# Patient Record
Sex: Female | Born: 1950
Health system: Southern US, Community
[De-identification: ages and names within clinical notes are randomized; demographics above are authoritative.]

## PROBLEM LIST (undated history)

## (undated) DIAGNOSIS — D0510 Intraductal carcinoma in situ of unspecified breast: Secondary | ICD-10-CM

## (undated) DIAGNOSIS — F419 Anxiety disorder, unspecified: Secondary | ICD-10-CM

## (undated) DIAGNOSIS — E785 Hyperlipidemia, unspecified: Secondary | ICD-10-CM

## (undated) DIAGNOSIS — R7303 Prediabetes: Secondary | ICD-10-CM

## (undated) DIAGNOSIS — E559 Vitamin D deficiency, unspecified: Secondary | ICD-10-CM

## (undated) DIAGNOSIS — I10 Essential (primary) hypertension: Secondary | ICD-10-CM

## (undated) HISTORY — DX: Hyperlipidemia, unspecified: E78.5

## (undated) HISTORY — PX: LUMBAR DISC SURGERY: SHX700

## (undated) HISTORY — PX: TUBAL LIGATION: SHX77

## (undated) HISTORY — DX: Intraductal carcinoma in situ of unspecified breast: D05.10

## (undated) HISTORY — DX: Vitamin D deficiency, unspecified: E55.9

---
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
## 1998-09-04 ENCOUNTER — Encounter: Payer: Self-pay | Admitting: Specialist

## 1998-09-04 ENCOUNTER — Observation Stay (HOSPITAL_COMMUNITY): Admission: RE | Admit: 1998-09-04 | Discharge: 1998-09-05 | Payer: Self-pay | Admitting: Specialist

## 1999-04-30 ENCOUNTER — Other Ambulatory Visit: Admission: RE | Admit: 1999-04-30 | Discharge: 1999-04-30 | Payer: Self-pay | Admitting: Obstetrics and Gynecology

## 1999-08-07 ENCOUNTER — Encounter (INDEPENDENT_AMBULATORY_CARE_PROVIDER_SITE_OTHER): Payer: Self-pay | Admitting: Specialist

## 1999-08-07 ENCOUNTER — Ambulatory Visit (HOSPITAL_COMMUNITY): Admission: RE | Admit: 1999-08-07 | Discharge: 1999-08-07 | Payer: Self-pay | Admitting: Obstetrics and Gynecology

## 2000-06-02 ENCOUNTER — Other Ambulatory Visit: Admission: RE | Admit: 2000-06-02 | Discharge: 2000-06-02 | Payer: Self-pay | Admitting: Obstetrics and Gynecology

## 2000-08-18 ENCOUNTER — Ambulatory Visit (HOSPITAL_COMMUNITY): Admission: RE | Admit: 2000-08-18 | Discharge: 2000-08-18 | Payer: Self-pay | Admitting: Internal Medicine

## 2000-08-18 ENCOUNTER — Encounter: Payer: Self-pay | Admitting: Internal Medicine

## 2001-06-02 ENCOUNTER — Other Ambulatory Visit: Admission: RE | Admit: 2001-06-02 | Discharge: 2001-06-02 | Payer: Self-pay | Admitting: Obstetrics and Gynecology

## 2002-09-24 ENCOUNTER — Encounter: Payer: Self-pay | Admitting: Internal Medicine

## 2002-09-24 ENCOUNTER — Ambulatory Visit (HOSPITAL_COMMUNITY): Admission: RE | Admit: 2002-09-24 | Discharge: 2002-09-24 | Payer: Self-pay | Admitting: Internal Medicine

## 2003-02-07 ENCOUNTER — Encounter: Payer: Self-pay | Admitting: Internal Medicine

## 2003-02-07 ENCOUNTER — Ambulatory Visit (HOSPITAL_COMMUNITY): Admission: RE | Admit: 2003-02-07 | Discharge: 2003-02-07 | Payer: Self-pay | Admitting: Internal Medicine

## 2003-06-10 ENCOUNTER — Emergency Department (HOSPITAL_COMMUNITY): Admission: EM | Admit: 2003-06-10 | Discharge: 2003-06-10 | Payer: Self-pay | Admitting: Emergency Medicine

## 2004-09-02 LAB — HM COLONOSCOPY

## 2004-10-04 ENCOUNTER — Other Ambulatory Visit: Admission: RE | Admit: 2004-10-04 | Discharge: 2004-10-04 | Payer: Self-pay | Admitting: Obstetrics and Gynecology

## 2005-04-30 ENCOUNTER — Ambulatory Visit: Payer: Self-pay | Admitting: Gastroenterology

## 2005-05-13 ENCOUNTER — Ambulatory Visit (HOSPITAL_COMMUNITY): Admission: RE | Admit: 2005-05-13 | Discharge: 2005-05-13 | Payer: Self-pay | Admitting: Internal Medicine

## 2005-06-28 ENCOUNTER — Ambulatory Visit: Payer: Self-pay | Admitting: Gastroenterology

## 2005-10-07 ENCOUNTER — Other Ambulatory Visit: Admission: RE | Admit: 2005-10-07 | Discharge: 2005-10-07 | Payer: Self-pay | Admitting: Obstetrics and Gynecology

## 2008-08-01 ENCOUNTER — Ambulatory Visit (HOSPITAL_COMMUNITY): Admission: RE | Admit: 2008-08-01 | Discharge: 2008-08-01 | Payer: Self-pay | Admitting: Internal Medicine

## 2008-09-14 ENCOUNTER — Ambulatory Visit (HOSPITAL_COMMUNITY): Admission: RE | Admit: 2008-09-14 | Discharge: 2008-09-14 | Payer: Self-pay | Admitting: Internal Medicine

## 2009-03-21 ENCOUNTER — Ambulatory Visit (HOSPITAL_COMMUNITY): Admission: RE | Admit: 2009-03-21 | Discharge: 2009-03-21 | Payer: Self-pay | Admitting: Internal Medicine

## 2009-07-13 LAB — HM DEXA SCAN

## 2009-09-02 HISTORY — PX: BREAST LUMPECTOMY: SHX2

## 2010-09-23 ENCOUNTER — Encounter: Payer: Self-pay | Admitting: Internal Medicine

## 2011-01-18 NOTE — Op Note (Signed)
[facility]  Patient:    Regina Shaw                      MRN: [id number] Proc. Date: 08/07/99 Adm. Date:  [id number] Attending:  Amanda Cockayne                           Operative Report  PREOPERATIVE DIAGNOSIS:       Irregular spotting on Prempro and hysterosonogram was irregular.  POSTOPERATIVE DIAGNOSIS:      Irregular spotting on Prempro and hysterosonogram was irregular.  Plus she had some endometrial polyps, small and she had somewhat atrophic endometrium.  OPERATION:                    Hysteroscopy and D&C.  SURGEON:                      Esmeralda Arthur, M.D.  ASSISTANT:  CATHETER:                     None.  MEDIUM:                       Sorbitol, deficit of 40 cc.  ANESTHESIA:  ESTIMATED BLOOD LOSS:  DESCRIPTION OF PROCEDURE:     The patient was carried to the operating room and  after satisfactory general anesthesia she was placed in the lithotomy position nd she was prepped and draped in a sterile field and the bladder was emptied by catheterization.  Examination revealed the uterus to feel anterior and not enlarged.  No masses felt in the adnexa.  A weighted speculum was placed in the posterior vagina.  The cervix was grasped  with a tenaculum.  The uterus sounded to 8 cm.  The cervix was easily dilated to a #23 Hegar dilators and the scope was placed in.  The endometrial cavity looked atrophic, but there were some irregular areas which I think were polyps.  The right tubal opening was large and the left tubal opening was visualized.  The right was much, much, probably three times as large as the left.  I could see no fibroids and I could see the irregular areas which were either hyperplasia or she had polyps.  We then did an exploration with Randall stonegrasping forceps and obtained no tissue.  Did a curettage sharp and got some tissue particularly posteriorly and  then did a Heaney curet with  a moderate amount of tissue.  I looked with the hysteroscope again and I could see no abnormalities and the procedure was terminated.  The patient was carried to the recovery room in good  condition. DD:  08/07/99 TD:  08/08/99 Job: [id number]

## 2012-04-29 ENCOUNTER — Encounter: Payer: Self-pay | Admitting: Family Medicine

## 2012-04-29 ENCOUNTER — Ambulatory Visit (INDEPENDENT_AMBULATORY_CARE_PROVIDER_SITE_OTHER): Payer: 59 | Admitting: Family Medicine

## 2012-04-29 VITALS — BP 120/60 | HR 80 | Temp 97.9°F | Ht 62.5 in | Wt 161.0 lb

## 2012-04-29 DIAGNOSIS — R5383 Other fatigue: Secondary | ICD-10-CM

## 2012-04-29 DIAGNOSIS — E785 Hyperlipidemia, unspecified: Secondary | ICD-10-CM

## 2012-04-29 DIAGNOSIS — E559 Vitamin D deficiency, unspecified: Secondary | ICD-10-CM

## 2012-04-29 DIAGNOSIS — Z13220 Encounter for screening for lipoid disorders: Secondary | ICD-10-CM

## 2012-04-29 DIAGNOSIS — R5381 Other malaise: Secondary | ICD-10-CM

## 2012-04-29 DIAGNOSIS — D0510 Intraductal carcinoma in situ of unspecified breast: Secondary | ICD-10-CM

## 2012-04-29 DIAGNOSIS — H9209 Otalgia, unspecified ear: Secondary | ICD-10-CM

## 2012-04-29 DIAGNOSIS — D0590 Unspecified type of carcinoma in situ of unspecified breast: Secondary | ICD-10-CM

## 2012-04-29 DIAGNOSIS — Z131 Encounter for screening for diabetes mellitus: Secondary | ICD-10-CM

## 2012-04-29 DIAGNOSIS — Z86000 Personal history of in-situ neoplasm of breast: Secondary | ICD-10-CM | POA: Insufficient documentation

## 2012-04-29 HISTORY — DX: Intraductal carcinoma in situ of unspecified breast: D05.10

## 2012-04-29 LAB — CBC WITH DIFFERENTIAL/PLATELET
Basophils Absolute: 0 10*3/uL (ref 0.0–0.1)
Basophils Relative: 0.7 % (ref 0.0–3.0)
Eosinophils Absolute: 0.1 10*3/uL (ref 0.0–0.7)
Eosinophils Relative: 2.6 % (ref 0.0–5.0)
HCT: 39.9 % (ref 36.0–46.0)
Hemoglobin: 13 g/dL (ref 12.0–15.0)
Lymphocytes Relative: 40.8 % (ref 12.0–46.0)
Lymphs Abs: 2.1 10*3/uL (ref 0.7–4.0)
MCHC: 32.7 g/dL (ref 30.0–36.0)
MCV: 87.8 fl (ref 78.0–100.0)
Monocytes Absolute: 0.4 10*3/uL (ref 0.1–1.0)
Monocytes Relative: 7.3 % (ref 3.0–12.0)
Neutro Abs: 2.5 10*3/uL (ref 1.4–7.7)
Neutrophils Relative %: 48.6 % (ref 43.0–77.0)
Platelets: 164 10*3/uL (ref 150.0–400.0)
RBC: 4.54 Mil/uL (ref 3.87–5.11)
RDW: 14.4 % (ref 11.5–14.6)
WBC: 5.2 10*3/uL (ref 4.5–10.5)

## 2012-04-29 LAB — HEPATIC FUNCTION PANEL
ALT: 12 U/L (ref 0–35)
AST: 20 U/L (ref 0–37)
Albumin: 3.5 g/dL (ref 3.5–5.2)
Alkaline Phosphatase: 71 U/L (ref 39–117)
Bilirubin, Direct: 0 mg/dL (ref 0.0–0.3)
Total Bilirubin: 0.6 mg/dL (ref 0.3–1.2)
Total Protein: 6.4 g/dL (ref 6.0–8.3)

## 2012-04-29 LAB — BASIC METABOLIC PANEL
BUN: 11 mg/dL (ref 6–23)
CO2: 28 mEq/L (ref 19–32)
Calcium: 9.1 mg/dL (ref 8.4–10.5)
Chloride: 105 mEq/L (ref 96–112)
Creatinine, Ser: 0.7 mg/dL (ref 0.4–1.2)
GFR: 88.32 mL/min (ref >60.00)
Glucose, Bld: 96 mg/dL (ref 70–99)
Potassium: 3.6 mEq/L (ref 3.5–5.1)
Sodium: 139 mEq/L (ref 135–145)

## 2012-04-29 LAB — LDL CHOLESTEROL, DIRECT: Direct LDL: 139.2 mg/dL

## 2012-04-29 NOTE — Progress Notes (Signed)
Nature conservation officer at [facility] [address] [place] [address] Phone: [phone] Fax: [phone]  Date:  04/29/2012   Name:  Regina Shaw   DOB:  Mar 29, 1951   MRN:  [id number] Gender: female Age: 61 y.o.  PCP:  Hannah Beat, MD    Chief Complaint: No chief complaint on file.  History of Present Illness:  Regina Shaw is a 61 y.o. very pleasant female patient who presents with the following:  New patient.  H/o DCIS, s/p lumpectomy in 09/2009, now doing fine, mammos have been normal  R ear is bothering her. Ceiling fan will makea clicking sound, and when lying on her R side. Will hurt her a lot. Will feel kind of raw.   Vit D level? Low in the past  H/o high chol, but she is not on medication  Past Medical History, Surgical History, Social History, Family History, Problem List, Medications, and Allergies have been reviewed and updated if relevant.  Patient Active Problem List  Diagnosis  . DCIS (ductal carcinoma in situ) of breast  . Hyperlipidemia    Past Medical History  Diagnosis Date  . DCIS (ductal carcinoma in situ) of breast 04/29/2012    Past Surgical History  Procedure Date  . Tubal ligation   . Breast lumpectomy 09/2009    [facility]  . Lumbar disc surgery     [facility]  . Tonsillectomy and adenoidectomy 1960    History  Substance Use Topics  . Smoking status: Former Games developer  . Smokeless tobacco: Not on file  . Alcohol Use: Not on file    Family History  Problem Relation Age of Onset  . Liver disease Mother     d/c at 56, acute liver failure  . COPD Father     d/c at 33  . Heart attack Brother 48  . Stroke Brother 15    same brother  . Alcohol abuse Brother     other brother    No Known Allergies  No current outpatient prescriptions on file prior to visit.     Review of Systems:  GEN: No acute illnesses, no fevers, chills. GI: No n/v/d, eating normally Pulm: No SOB Interactive and getting along  well at home.  Otherwise, ROS is as per the HPI.   Physical Examination: Filed Vitals:   04/29/12 1401  BP: 120/60  Pulse: 80  Temp: 97.9 F (36.6 C)   Filed Vitals:   04/29/12 1401  Height: 5' 2.5" (1.588 m)  Weight: 161 lb (73.029 kg)   Body mass index is 28.98 kg/(m^2). Ideal Body Weight: Weight in (lb) to have BMI = 25: 138.6    GEN: WDWN, NAD, Non-toxic, A & O x 3 HEENT: Atraumatic, Normocephalic. Neck supple. No masses, No LAD. Ears and Nose: No external deformity. Cerumen impaction CV: RRR, No M/G/R. No JVD. No thrill. No extra heart sounds. PULM: CTA B, no wheezes, crackles, rhonchi. No retractions. No resp. distress. No accessory muscle use. EXTR: No c/c/e NEURO Normal gait.  PSYCH: Normally interactive. Conversant. Not depressed or anxious appearing.  Calm demeanor.    Assessment and Plan:  1. Ear pain    2. Screening for diabetes mellitus  Basic metabolic panel  3. Screening for lipoid disorders  LDL cholesterol, direct  4. Other malaise and fatigue  CBC with Differential, Hepatic function panel  5. Vitamin d deficiency  Vitamin D 25 hydroxy  6. DCIS (ductal carcinoma in situ) of breast  7. Hyperlipidemia     Ceruminosis is noted.  Wax is removed by syringing and manual debridement by nursing staff  Check basic labs  We will obtain records from the patient's prior physicians.   Orders Today:  Orders Placed This Encounter  Procedures  . Vitamin D 25 hydroxy  . Basic metabolic panel  . CBC with Differential  . Hepatic function panel  . LDL cholesterol, direct    Medications Today: (Includes new updates added during medication reconciliation) Meds ordered this encounter  Medications  . Cholecalciferol (VITAMIN D) 2000 UNITS CAPS    Sig: Take one by mouth daily  . Omega-3 Fatty Acids (FISH OIL PO)    Sig: Take by mouth.  Marland Kitchen aspirin 81 MG tablet    Sig: Take 81 mg by mouth daily.  . Cyanocobalamin (VITAMIN B12 PO)    Sig: Take by mouth.  .  Multiple Vitamin (MULTIVITAMIN) tablet    Sig: Take 1 tablet by mouth daily.    Medications Discontinued: There are no discontinued medications.   Hannah Beat, MD

## 2012-04-30 ENCOUNTER — Encounter: Payer: Self-pay | Admitting: *Deleted

## 2012-04-30 LAB — VITAMIN D 25 HYDROXY (VIT D DEFICIENCY, FRACTURES): Vit D, 25-Hydroxy: 54 ng/mL (ref 30–89)

## 2012-05-18 ENCOUNTER — Telehealth: Payer: Self-pay

## 2012-05-18 NOTE — Telephone Encounter (Signed)
Pt left v/m with name DOB and phone #. I called phone # but v/m not set up.

## 2012-05-21 NOTE — Telephone Encounter (Signed)
Left vm for pt to callback 

## 2012-05-29 NOTE — Telephone Encounter (Signed)
Left vm for pt to callback 

## 2012-06-04 NOTE — Telephone Encounter (Signed)
Letter sent to pt

## 2012-06-24 ENCOUNTER — Ambulatory Visit: Payer: 59 | Admitting: Family Medicine

## 2012-06-29 ENCOUNTER — Ambulatory Visit: Payer: 59 | Admitting: Family Medicine

## 2012-07-02 ENCOUNTER — Ambulatory Visit: Payer: 59 | Admitting: Family Medicine

## 2012-07-02 DIAGNOSIS — Z0289 Encounter for other administrative examinations: Secondary | ICD-10-CM

## 2013-09-06 DIAGNOSIS — E782 Mixed hyperlipidemia: Secondary | ICD-10-CM | POA: Insufficient documentation

## 2013-09-06 DIAGNOSIS — E785 Hyperlipidemia, unspecified: Secondary | ICD-10-CM

## 2013-09-07 ENCOUNTER — Encounter: Payer: Self-pay | Admitting: Internal Medicine

## 2013-09-07 DIAGNOSIS — I10 Essential (primary) hypertension: Secondary | ICD-10-CM | POA: Insufficient documentation

## 2013-09-07 DIAGNOSIS — R7303 Prediabetes: Secondary | ICD-10-CM

## 2013-09-07 DIAGNOSIS — E559 Vitamin D deficiency, unspecified: Secondary | ICD-10-CM | POA: Insufficient documentation

## 2013-09-07 HISTORY — DX: Prediabetes: R73.03

## 2013-09-07 NOTE — Progress Notes (Signed)
Patient ID: Regina Shaw, female   DOB: May 15, 1951, 63 y.o.   MRN: [id number]  Annual Screening Comprehensive Examination  This very nice 63 y.o. female presents for complete physical.  Patient has been followed for HTN, Diabetes  Prediabetes, Hyperlipidemia, and Vitamin D Deficiency.    HTN predates since     . Patient's BP has been controlled at home. Today's  . Patient denies any cardiac symptoms as chest pain, palpitations, shortness of breath, dizziness or ankle swelling.   Patient's hyperlipidemia is controlled with diet and medications. Patient denies myalgias or other medication SE's. Last cholesterol last visit was  , triglycerides  , HDL  and LDL   .     Patient has prediabetes/insulin resistance predating since      with last A1c    / insulin   . Patient denies reactive hypoglycemic s   Finally, patient has history of Vitamin D Deficiency with last vitamin D      Current Outpatient Prescriptions on File Prior to Visit  Medication Sig Dispense Refill  . aspirin 81 MG tablet Take 81 mg by mouth daily.      . Cholecalciferol (VITAMIN D) 2000 UNITS CAPS Take one by mouth daily      . Cyanocobalamin (VITAMIN B12 PO) Take by mouth.      . Multiple Vitamin (MULTIVITAMIN) tablet Take 1 tablet by mouth daily.      . Omega-3 Fatty Acids (FISH OIL PO) Take by mouth.       No current facility-administered medications on file prior to visit.    Allergies  Allergen Reactions  . Darvon [Propoxyphene]   . Red Yeast Rice [Cholestin]     Dizzy    Past Medical History  Diagnosis Date  . DCIS (ductal carcinoma in situ) of breast 04/29/2012  . Hyperlipidemia   . Vitamin D deficiency     Past Surgical History  Procedure Laterality Date  . Tubal ligation    . Breast lumpectomy  09/2009    [facility]  . Lumbar disc surgery      [facility]  . Tonsillectomy and adenoidectomy  1960    Family History  Problem Relation Age of Onset  . Liver disease Mother     d/c at 87,  acute liver failure  . COPD Father     d/c at 32  . Heart attack Brother 80  . Stroke Brother 34    same brother  . Alcohol abuse Brother     other brother    History  Substance Use Topics  . Smoking status: Former Research scientist (life sciences)  . Smokeless tobacco: Not on file  . Alcohol Use: Not on file    ROS Constitutional: Denies fever, chills, weight loss/gain, headaches, insomnia, fatigue, night sweats, and change in appetite. Eyes: Denies redness, blurred vision, diplopia, discharge, itchy, watery eyes.  ENT: Denies discharge, congestion, post nasal drip, epistaxis, sore throat, earache, hearing loss, dental pain, Tinnitus, Vertigo, Sinus pain, snoring.  Cardio: Denies chest pain, palpitations, irregular heartbeat, syncope, dyspnea, diaphoresis, orthopnea, PND, claudication, edema Respiratory: denies cough, dyspnea, DOE, pleurisy, hoarseness, laryngitis, wheezing.  Gastrointestinal: Denies dysphagia, heartburn, reflux, water brash, pain, cramps, nausea, vomiting, bloating, diarrhea, constipation, hematemesis, melena, hematochezia, jaundice, hemorrhoids Genitourinary: Denies dysuria, frequency, urgency, nocturia, hesitancy, discharge, hematuria, flank pain Breast:Breast lumps, nipple discharge, bleeding.  Musculoskeletal: Denies arthralgia, myalgia, stiffness, Jt. Swelling, pain, limp, and strain/sprain. Skin: Denies puritis, rash, hives, warts, acne, eczema, changing in skin lesion Neuro: No weakness, tremor, incoordination, spasms,  paresthesia, pain Psychiatric: Denies confusion, memory loss, sensory loss Endocrine: Denies change in weight, skin, hair change, nocturia, and paresthesia, diabetic polys, visual blurring, hyper / hypo glycemic episodes.  Heme/Lymph: No excessive bleeding, bruising, enlarged lymph nodes.  There were no vitals filed for this visit.  Estimated body mass index is 28.96 kg/(m^2) as calculated from the following:   Height as of 04/29/12: 5' 2.5" (1.588 m).   Weight as of  04/29/12: 161 lb (73.029 kg).  Physical Exam General Appearance: Well nourished, in no apparent distress. Eyes: PERRLA, EOMs, conjunctiva no swelling or erythema, normal fundi and vessels. Sinuses: No frontal/maxillary tenderness ENT/Mouth: EACs patent / TMs  nl. Nares clear without erythema, swelling, mucoid exudates. Oral hygiene is good. No erythema, swelling, or exudate. Tongue normal, non-obstructing. Tonsils not swollen or erythematous. Hearing normal.  Neck: Supple, thyroid normal. No bruits, nodes or JVD. Respiratory: Respiratory effort normal.  BS equal and clear bilateral without rales, rhonci, wheezing or stridor. Cardio: Heart sounds are normal with regular rate and rhythm and no murmurs, rubs or gallops. Peripheral pulses are normal and equal bilaterally without edema. No aortic or femoral bruits. Chest: symmetric with normal excursions and percussion. Breasts: Symmetric, without lumps, nipple discharge, retractions, or fibrocystic changes.  Abdomen: Flat, soft, with bowl sounds. Nontender, no guarding, rebound, hernias, masses, or organomegaly.  Lymphatics: Non tender without lymphadenopathy.  Genitourinary:  Musculoskeletal: Full ROM all peripheral extremities, joint stability, 5/5 strength, and normal gait. Skin: Warm and dry without rashes, lesions, cyanosis, clubbing or  ecchymosis.  Neuro: Cranial nerves intact, reflexes equal bilaterally. Normal muscle tone, no cerebellar symptoms. Sensation intact.  Pysch: Awake and oriented X 3, normal affect, Insight and Judgment appropriate.   Assessment and Plan  1. Annual Screening Examination 2. Hypertension  3. Hyperlipidemia 4. Pre Diabetes 5. Vitamin D Deficiency  Continue prudent diet as discussed, weight control, BP monitoring, regular exercise, and medications. Discussed med's effects and SE's. Screening labs and tests as requested with regular follow-up as recommended.   This encounter was created in error - please  disregard.

## 2013-09-07 NOTE — Patient Instructions (Signed)

## 2013-09-22 ENCOUNTER — Ambulatory Visit (INDEPENDENT_AMBULATORY_CARE_PROVIDER_SITE_OTHER): Payer: 59 | Admitting: Physician Assistant

## 2013-09-22 VITALS — BP 122/60 | HR 80 | Temp 97.9°F | Resp 16 | Wt 163.0 lb

## 2013-09-22 DIAGNOSIS — M26609 Unspecified temporomandibular joint disorder, unspecified side: Secondary | ICD-10-CM

## 2013-09-22 DIAGNOSIS — F411 Generalized anxiety disorder: Secondary | ICD-10-CM

## 2013-09-22 MED ORDER — CYCLOBENZAPRINE HCL 10 MG PO TABS: ORAL_TABLET | ORAL | Status: DC

## 2013-09-22 MED ORDER — SERTRALINE HCL 50 MG PO TABS: 50.0000 mg | ORAL_TABLET | Freq: Every day | ORAL | Status: DC

## 2013-09-22 NOTE — Progress Notes (Signed)
   Subjective:    Patient ID: Regina Shaw, female    DOB: 1951-04-12, 63 y.o.   MRN: [id number]  Otalgia  There is pain in both ears. This is a new problem. The current episode started today. The problem occurs constantly. The problem has been unchanged. There has been no fever. The pain is mild. Associated symptoms include hearing loss (feels she is talking in a tunnel). Pertinent negatives include no abdominal pain, coughing, diarrhea, drainage, ear discharge, headaches, neck pain, rash, rhinorrhea, sore throat or vomiting. Treatments tried: tried autoinflation. The treatment provided no relief.   Review of Systems  Constitutional: Negative.   HENT: Positive for dental problem (Recent dental work ), ear pain and hearing loss (feels she is talking in a tunnel). Negative for ear discharge, rhinorrhea and sore throat.   Eyes: Negative.   Respiratory: Negative.  Negative for cough.   Cardiovascular: Negative.   Gastrointestinal: Negative.  Negative for vomiting, abdominal pain and diarrhea.  Genitourinary: Negative.   Musculoskeletal: Negative.  Negative for neck pain.  Skin: Negative for rash.  Neurological: Negative.  Negative for headaches.      Objective:   Physical Exam  Constitutional: She is oriented to person, place, and time. She appears well-developed and well-nourished.  HENT:  Head: Normocephalic and atraumatic.  Right Ear: External ear normal.  Left Ear: External ear normal.  Mouth/Throat: Oropharynx is clear and moist.  + Right TMJ tenderness Some right upper tooth pain  Eyes: Conjunctivae and EOM are normal. Pupils are equal, round, and reactive to light.  Neck: Normal range of motion. Neck supple. No thyromegaly present.  Cardiovascular: Normal rate, regular rhythm and normal heart sounds.  Exam reveals no gallop and no friction rub.   No murmur heard. Pulmonary/Chest: Effort normal and breath sounds normal. No respiratory distress. She has no wheezes.  Abdominal:  Soft. Bowel sounds are normal. She exhibits no distension and no mass. There is no tenderness. There is no rebound and no guarding.  Musculoskeletal: Normal range of motion.  Lymphadenopathy:    She has no cervical adenopathy.  Neurological: She is alert and oriented to person, place, and time. She displays normal reflexes. No cranial nerve deficit. Coordination normal.  Skin: Skin is warm and dry.  Psychiatric: She has a normal mood and affect.      Assessment & Plan:  TMJ vs Otitis effusion without infection  Likely TMJ with recent dental work. Worse in morning.   Massage, heat, soft foods.   Muscle relaxer  Get on antihistamine, continue autoinflation  Anxiety/stress- zoloft 50mg  daily.

## 2013-09-22 NOTE — Patient Instructions (Signed)
What is the TMJ? The temporomandibular (tem-PUH-ro-man-DIB-yoo-ler) joint, or the TMJ, connects the upper and lower jawbones. This joint allows the jaw to open wide and move back and forth when you chew, talk, or yawn.There are also several muscles that help this joint move. There can be muscle tightness and pain in the muscle that can cause several symptoms.  What causes TMJ pain? There are many causes of TMJ pain. Repeated chewing (for example, chewing gum) and clenching your teeth can cause pain in the joint. Some TMJ pain has no obvious cause. What can I do to ease the pain? There are many things you can do to help your pain get better. When you have pain:  Eat soft foods and stay away from chewy foods (for example, taffy) Try to use both sides of your mouth to chew Don't chew gum Don't open your mouth wide (for example, during yawning or singing) Don't bite your cheeks or fingernails Lower your amount of stress and worry Applying a warm, damp washcloth to the joint may help. Massage/ please call your insurance company and see how much PT is for your neck /jaw Over-the-counter pain medicines such as ibuprofen (one brand: Advil) or acetaminophen (one brand: Tylenol) might also help. Do not use these medicines if you are allergic to them or if your doctor told you not to use them. How can I stop the pain from coming back? When your pain is better, you can do these exercises to make your muscles stronger and to keep the pain from coming back:  Resisted mouth opening: Place your thumb or two fingers under your chin and open your mouth slowly, pushing up lightly on your chin with your thumb. Hold for three to six seconds. Close your mouth slowly. Resisted mouth closing: Place your thumbs under your chin and your two index fingers on the ridge between your mouth and the bottom of your chin. Push down lightly on your chin as you close your mouth. Tongue up: Slowly open and close your mouth while  keeping the tongue touching the roof of the mouth. Side-to-side jaw movement: Place an object about one fourth of an inch thick (for example, two tongue depressors) between your front teeth. Slowly move your jaw from side to side. Increase the thickness of the object as the exercise becomes easier Forward jaw movement: Place an object about one fourth of an inch thick between your front teeth and move the bottom jaw forward so that the bottom teeth are in front of the top teeth. Increase the thickness of the object as the exercise becomes easier. These exercises should not be painful. If it hurts to do these exercises, stop doing them and talk to your family doctor.

## 2013-09-24 ENCOUNTER — Other Ambulatory Visit: Payer: Self-pay | Admitting: Physician Assistant

## 2013-09-24 DIAGNOSIS — M26609 Unspecified temporomandibular joint disorder, unspecified side: Secondary | ICD-10-CM

## 2013-09-24 DIAGNOSIS — M542 Cervicalgia: Secondary | ICD-10-CM

## 2013-10-17 DIAGNOSIS — Z79899 Other long term (current) drug therapy: Secondary | ICD-10-CM | POA: Insufficient documentation

## 2013-10-17 NOTE — Progress Notes (Signed)
Patient ID: Regina Shaw, female   DOB: 1951/07/03, 63 y.o.   MRN: [id number]   Annual Screening Comprehensive Examination  This very nice 63 y.o. [facility] presents for complete physical.  Patient has been followed for Labile HTN, Prediabetes, Hyperlipidemia, and Vitamin D Deficiency.    Labile HTN has been monitored expectantly  Since 2004. Patient's BP has been controlled at home. Today's BP: 132/66 mmHg. Patient denies any cardiac symptoms as chest pain, palpitations, shortness of breath, dizziness or ankle swelling.   Patient's hyperlipidemia is controlled with diet. Patient denies myalgias or other medication SE's. Last cholesterol last visit was 223, triglycerides 164, HDL 62 and LDL 128 in July 2014.    Patient has prediabetes with A1c 6.3% in Mar 2011 and last A1c was 5.8% in July 2014. Patient denies reactive hypoglycemic symptoms, visual blurring, diabetic polys, or paresthesias.    Finally, patient has history of Vitamin D Deficiency of 24 in 2008 with last vitamin D 66 in July 2014.       Medication List     aspirin 81 MG tablet  Take 81 mg by mouth daily.     cyclobenzaprine 10 MG tablet  Commonly known as:  FLEXERIL  1-2 pill at night for jaw/neck pain.     multivitamin tablet  Take 1 tablet by mouth daily.     sertraline 50 MG tablet  Commonly known as:  ZOLOFT  Take 1 tablet (50 mg total) by mouth daily.     Vitamin D 2000 UNITS Caps  Take one by mouth daily        Allergies  Allergen Reactions  . Darvon [Propoxyphene]   . Red Yeast Rice [Cholestin]     Dizzy    Past Medical History  Diagnosis Date  . DCIS (ductal carcinoma in situ) of breast 04/29/2012  . Hyperlipidemia   . Vitamin D deficiency     Past Surgical History  Procedure Laterality Date  . Tubal ligation    . Breast lumpectomy  09/2009    [facility]  . Lumbar disc surgery      [facility]  . Tonsillectomy and adenoidectomy  1960    Family History  Problem Relation Age of  Onset  . Liver disease Mother     d/c at 59, acute liver failure  . COPD Father     d/c at 101  . Heart attack Brother 15  . Stroke Brother 96    same brother  . Alcohol abuse Brother     other brother    History  Substance Use Topics  . Smoking status: Former Research scientist (life sciences)  . Smokeless tobacco: Not on file  . Alcohol Use: No    ROS Constitutional: Denies fever, chills, weight loss/gain, headaches, insomnia, fatigue, night sweats, and change in appetite. Eyes: Denies redness, blurred vision, diplopia, discharge, itchy, watery eyes.  ENT: Denies discharge, congestion, post nasal drip, epistaxis, sore throat, earache, hearing loss, dental pain, Tinnitus, Vertigo, Sinus pain, snoring.  Cardio: Denies chest pain, palpitations, irregular heartbeat, syncope, dyspnea, diaphoresis, orthopnea, PND, claudication, edema Respiratory: denies cough, dyspnea, DOE, pleurisy, hoarseness, laryngitis, wheezing.  Gastrointestinal: Denies dysphagia, heartburn, reflux, water brash, pain, cramps, nausea, vomiting, bloating, diarrhea, constipation, hematemesis, melena, hematochezia, jaundice, hemorrhoids Genitourinary: Denies dysuria, frequency, urgency, nocturia, hesitancy, discharge, hematuria, flank pain Breast:Breast lumps, nipple discharge, bleeding.  Musculoskeletal: Denies arthralgia, myalgia, stiffness, Jt. Swelling, pain, limp, and strain/sprain. Skin: Denies puritis, rash, hives, warts, acne, eczema, changing in skin lesion Neuro: No weakness, tremor, incoordination,  spasms, paresthesia, pain Psychiatric: Denies confusion, memory loss, sensory loss Endocrine: Denies change in weight, skin, hair change, nocturia, and paresthesia, diabetic polys, visual blurring, hyper / hypo glycemic episodes.  Heme/Lymph: No excessive bleeding, bruising, enlarged lymph nodes.  BP: 132/66  Pulse: 92  Temp: 98.1 F (36.7 C)  Resp: 18    Estimated body mass index is 29.43 kg/(m^2) as calculated from the following:    Height as of this encounter: 5' 2.25" (1.581 m).   Weight as of this encounter: 162 lb 3.2 oz (73.573 kg).  Physical Exam General Appearance: Well nourished, in no apparent distress. Eyes: PERRLA, EOMs, conjunctiva no swelling or erythema, normal fundi and vessels. Sinuses: No frontal/maxillary tenderness ENT/Mouth: EACs patent / TMs  nl. Nares clear without erythema, swelling, mucoid exudates. Oral hygiene is good. No erythema, swelling, or exudate. Tongue normal, non-obstructing. Tonsils not swollen or erythematous. Hearing normal.  Neck: Supple, thyroid normal. No bruits, nodes or JVD. Respiratory: Respiratory effort normal.  BS equal and clear bilateral without rales, rhonci, wheezing or stridor. Cardio: Heart sounds are normal with regular rate and rhythm and no murmurs, rubs or gallops. Peripheral pulses are normal and equal bilaterally without edema. No aortic or femoral bruits. Chest: symmetric with normal excursions and percussion. Breasts: Symmetric, without lumps, nipple discharge, retractions, or fibrocystic changes.  Abdomen: Flat, soft, with bowl sounds. Nontender, no guarding, rebound, hernias, masses, or organomegaly.  Lymphatics: Non tender without lymphadenopathy.  Genitourinary:  Musculoskeletal: Full ROM all peripheral extremities, joint stability, 5/5 strength, and normal gait. Skin: Warm and dry without rashes, lesions, cyanosis, clubbing or  ecchymosis.  Neuro: Cranial nerves intact, reflexes equal bilaterally. Normal muscle tone, no cerebellar symptoms. Sensation intact.  Pysch: Awake and oriented X 3, normal affect, Insight and Judgment appropriate.   Assessment and Plan  1. Annual Screening Examination 2. Hypertension, Labile - monitoring  3. Hyperlipidemia, diet - Treat pending labs 4. Pre Diabetes - encouraged Weight Loss 5. Vitamin D Deficiency - supplement  Continue prudent diet as discussed, weight control, BP monitoring, regular exercise, and medications.  Discussed med's effects and SE's. Screening labs and tests as requested with regular follow-up as recommended.

## 2013-10-17 NOTE — Patient Instructions (Addendum)
 Hypertension As your heart beats, it forces blood through your arteries. This force is your blood pressure. If the pressure is too high, it is called hypertension (HTN) or high blood pressure. HTN is dangerous because you may have it and not know it. High blood pressure may mean that your heart has to work harder to pump blood. Your arteries may be narrow or stiff. The extra work puts you at risk for heart disease, stroke, and other problems.  Blood pressure consists of two numbers, a higher number over a lower, 110/72, for example. It is stated as "110 over 72." The ideal is below 120 for the top number (systolic) and under 80 for the bottom (diastolic). Write down your blood pressure today. You should pay close attention to your blood pressure if you have certain conditions such as:  Heart failure.  Prior heart attack.  Diabetes  Chronic kidney disease.  Prior stroke.  Multiple risk factors for heart disease. To see if you have HTN, your blood pressure should be measured while you are seated with your arm held at the level of the heart. It should be measured at least twice. A one-time elevated blood pressure reading (especially in the Emergency Department) does not mean that you need treatment. There may be conditions in which the blood pressure is different between your right and left arms. It is important to see your caregiver soon for a recheck. Most people have essential hypertension which means that there is not a specific cause. This type of high blood pressure may be lowered by changing lifestyle factors such as:  Stress.  Smoking.  Lack of exercise.  Excessive weight.  Drug/tobacco/alcohol use.  Eating less salt. Most people do not have symptoms from high blood pressure until it has caused damage to the body. Effective treatment can often prevent, delay or reduce that damage. TREATMENT  When a cause has been identified, treatment for high blood pressure is directed at  the cause. There are a large number of medications to treat HTN. These fall into several categories, and your caregiver will help you select the medicines that are best for you. Medications may have side effects. You should review side effects with your caregiver. If your blood pressure stays high after you have made lifestyle changes or started on medicines,   Your medication(s) may need to be changed.  Other problems may need to be addressed.  Be certain you understand your prescriptions, and know how and when to take your medicine.  Be sure to follow up with your caregiver within the time frame advised (usually within two weeks) to have your blood pressure rechecked and to review your medications.  If you are taking more than one medicine to lower your blood pressure, make sure you know how and at what times they should be taken. Taking two medicines at the same time can result in blood pressure that is too low. SEEK IMMEDIATE MEDICAL CARE IF:  You develop a severe headache, blurred or changing vision, or confusion.  You have unusual weakness or numbness, or a faint feeling.  You have severe chest or abdominal pain, vomiting, or breathing problems. MAKE SURE YOU:   Understand these instructions.  Will watch your condition.  Will get help right away if you are not doing well or get worse.   Diabetes and Exercise Exercising regularly is important. It is not just about losing weight. It has many health benefits, such as:  Improving your overall fitness, flexibility, and   endurance.  Increasing your bone density.  Helping with weight control.  Decreasing your body fat.  Increasing your muscle strength.  Reducing stress and tension.  Improving your overall health. People with diabetes who exercise gain additional benefits because exercise:  Reduces appetite.  Improves the body's use of blood sugar (glucose).  Helps lower or control blood glucose.  Decreases blood  pressure.  Helps control blood lipids (such as cholesterol and triglycerides).  Improves the body's use of the hormone insulin by:  Increasing the body's insulin sensitivity.  Reducing the body's insulin needs.  Decreases the risk for heart disease because exercising:  Lowers cholesterol and triglycerides levels.  Increases the levels of good cholesterol (such as high-density lipoproteins [HDL]) in the body.  Lowers blood glucose levels. YOUR ACTIVITY PLAN  Choose an activity that you enjoy and set realistic goals. Your health care provider or diabetes educator can help you make an activity plan that works for you. You can break activities into 2 or 3 sessions throughout the day. Doing so is as good as one long session. Exercise ideas include:  Taking the dog for a walk.  Taking the stairs instead of the elevator.  Dancing to your favorite song.  Doing your favorite exercise with a friend. RECOMMENDATIONS FOR EXERCISING WITH TYPE 1 OR TYPE 2 DIABETES   Check your blood glucose before exercising. If blood glucose levels are greater than 240 mg/dL, check for urine ketones. Do not exercise if ketones are present.  Avoid injecting insulin into areas of the body that are going to be exercised. For example, avoid injecting insulin into:  The arms when playing tennis.  The legs when jogging.  Keep a record of:  Food intake before and after you exercise.  Expected peak times of insulin action.  Blood glucose levels before and after you exercise.  The type and amount of exercise you have done.  Review your records with your health care provider. Your health care provider will help you to develop guidelines for adjusting food intake and insulin amounts before and after exercising.  If you take insulin or oral hypoglycemic agents, watch for signs and symptoms of hypoglycemia. They include:  Dizziness.  Shaking.  Sweating.  Chills.  Confusion.  Drink plenty of water  while you exercise to prevent dehydration or heat stroke. Body water is lost during exercise and must be replaced.  Talk to your health care provider before starting an exercise program to make sure it is safe for you. Remember, almost any type of activity is better than none.    Cholesterol Cholesterol is a white, waxy, fat-like protein needed by your body in small amounts. The liver makes all the cholesterol you need. It is carried from the liver by the blood through the blood vessels. Deposits (plaque) may build up on blood vessel walls. This makes the arteries narrower and stiffer. Plaque increases the risk for heart attack and stroke. You cannot feel your cholesterol level even if it is very high. The only way to know is by a blood test to check your lipid (fats) levels. Once you know your cholesterol levels, you should keep a record of the test results. Work with your caregiver to to keep your levels in the desired range. WHAT THE RESULTS MEAN:  Total cholesterol is a rough measure of all the cholesterol in your blood.  LDL is the so-called bad cholesterol. This is the type that deposits cholesterol in the walls of the arteries. You want this   level to be low.  HDL is the good cholesterol because it cleans the arteries and carries the LDL away. You want this level to be high.  Triglycerides are fat that the body can either burn for energy or store. High levels are closely linked to heart disease. DESIRED LEVELS:  Total cholesterol below 200.  LDL below 100 for people at risk, below 70 for very high risk.  HDL above 50 is good, above 60 is best.  Triglycerides below 150. HOW TO LOWER YOUR CHOLESTEROL:  Diet.  Choose fish or white meat chicken and turkey, roasted or baked. Limit fatty cuts of red meat, fried foods, and processed meats, such as sausage and lunch meat.  Eat lots of fresh fruits and vegetables. Choose whole grains, beans, pasta, potatoes and cereals.  Use only  small amounts of olive, corn or canola oils. Avoid butter, mayonnaise, shortening or palm kernel oils. Avoid foods with trans-fats.  Use skim/nonfat milk and low-fat/nonfat yogurt and cheeses. Avoid whole milk, cream, ice cream, egg yolks and cheeses. Healthy desserts include angel food cake, ginger snaps, animal crackers, hard candy, popsicles, and low-fat/nonfat frozen yogurt. Avoid pastries, cakes, pies and cookies.  Exercise.  A regular program helps decrease LDL and raises HDL.  Helps with weight control.  Do things that increase your activity level like gardening, walking, or taking the stairs.  Medication.  May be prescribed by your caregiver to help lowering cholesterol and the risk for heart disease.  You may need medicine even if your levels are normal if you have several risk factors. HOME CARE INSTRUCTIONS   Follow your diet and exercise programs as suggested by your caregiver.  Take medications as directed.  Have blood work done when your caregiver feels it is necessary. MAKE SURE YOU:   Understand these instructions.  Will watch your condition.  Will get help right away if you are not doing well or get worse.      Vitamin D Deficiency Vitamin D is an important vitamin that your body needs. Having too little of it in your body is called a deficiency. A very bad deficiency can make your bones soft and can cause a condition called rickets.  Vitamin D is important to your body for different reasons, such as:   It helps your body absorb 2 minerals called calcium and phosphorus.  It helps make your bones healthy.  It may prevent some diseases, such as diabetes and multiple sclerosis.  It helps your muscles and heart. You can get vitamin D in several ways. It is a natural part of some foods. The vitamin is also added to some dairy products and cereals. Some people take vitamin D supplements. Also, your body makes vitamin D when you are in the sun. It changes the  sun's rays into a form of the vitamin that your body can use. CAUSES   Not eating enough foods that contain vitamin D.  Not getting enough sunlight.  Having certain digestive system diseases that make it hard to absorb vitamin D. These diseases include Crohn's disease, chronic pancreatitis, and cystic fibrosis.  Having a surgery in which part of the stomach or small intestine is removed.  Being obese. Fat cells pull vitamin D out of your blood. That means that obese people may not have enough vitamin D left in their blood and in other body tissues.  Having chronic kidney or liver disease. RISK FACTORS Risk factors are things that make you more likely to develop a vitamin   D deficiency. They include:  Being older.  Not being able to get outside very much.  Living in a nursing home.  Having had broken bones.  Having weak or thin bones (osteoporosis).  Having a disease or condition that changes how your body absorbs vitamin D.  Having dark skin.  Some medicines such as seizure medicines or steroids.  Being overweight or obese. SYMPTOMS Mild cases of vitamin D deficiency may not have any symptoms. If you have a very bad case, symptoms may include:  Bone pain.  Muscle pain.  Falling often.  Broken bones caused by a minor injury, due to osteoporosis. DIAGNOSIS A blood test is the best way to tell if you have a vitamin D deficiency. TREATMENT Vitamin D deficiency can be treated in different ways. Treatment for vitamin D deficiency depends on what is causing it. Options include:  Taking vitamin D supplements.  Taking a calcium supplement. Your caregiver will suggest what dose is best for you. HOME CARE INSTRUCTIONS  Take any supplements that your caregiver prescribes. Follow the directions carefully. Take only the suggested amount.  Have your blood tested 2 months after you start taking supplements.  Eat foods that contain vitamin D. Healthy choices  include:  Fortified dairy products, cereals, or juices. Fortified means vitamin D has been added to the food. Check the label on the package to be sure.  Fatty fish like salmon or trout.  Eggs.  Oysters.  Do not use a tanning bed.  Keep your weight at a healthy level. Lose weight if you need to.  Keep all follow-up appointments. Your caregiver will need to perform blood tests to make sure your vitamin D deficiency is going away. SEEK MEDICAL CARE IF:  You have any questions about your treatment.  You continue to have symptoms of vitamin D deficiency.  You have nausea or vomiting.  You are constipated.  You feel confused.  You have severe abdominal or back pain. MAKE SURE YOU:  Understand these instructions.  Will watch your condition.  Will get help right away if you are not doing well or get worse.  What is the TMJ? The temporomandibular (tem-PUH-ro-man-DIB-yoo-ler) joint, or the TMJ, connects the upper and lower jawbones. This joint allows the jaw to open wide and move back and forth when you chew, talk, or yawn.There are also several muscles that help this joint move. There can be muscle tightness and pain in the muscle that can cause several symptoms.  What causes TMJ pain? There are many causes of TMJ pain. Repeated chewing (for example, chewing gum) and clenching your teeth can cause pain in the joint. Some TMJ pain has no obvious cause. What can I do to ease the pain? There are many things you can do to help your pain get better. When you have pain:  Eat soft foods and stay away from chewy foods (for example, taffy) Try to use both sides of your mouth to chew Don't chew gum Don't open your mouth wide (for example, during yawning or singing) Don't bite your cheeks or fingernails Lower your amount of stress and worry Applying a warm, damp washcloth to the joint may help. Over-the-counter pain medicines such as ibuprofen (one brand: Advil) or acetaminophen (one  brand: Tylenol) might also help. Do not use these medicines if you are allergic to them or if your doctor told you not to use them. How can I stop the pain from coming back? When your pain is better, you can do  these exercises to make your muscles stronger and to keep the pain from coming back:  Resisted mouth opening: Place your thumb or two fingers under your chin and open your mouth slowly, pushing up lightly on your chin with your thumb. Hold for three to six seconds. Close your mouth slowly. Resisted mouth closing: Place your thumbs under your chin and your two index fingers on the ridge between your mouth and the bottom of your chin. Push down lightly on your chin as you close your mouth. Tongue up: Slowly open and close your mouth while keeping the tongue touching the roof of the mouth. Side-to-side jaw movement: Place an object about one fourth of an inch thick (for example, two tongue depressors) between your front teeth. Slowly move your jaw from side to side. Increase the thickness of the object as the exercise becomes easier Forward jaw movement: Place an object about one fourth of an inch thick between your front teeth and move the bottom jaw forward so that the bottom teeth are in front of the top teeth. Increase the thickness of the object as the exercise becomes easier. These exercises should not be painful. If it hurts to do these exercises, stop doing them and talk to your family doctor.

## 2013-10-18 ENCOUNTER — Encounter: Payer: Self-pay | Admitting: Internal Medicine

## 2013-10-18 ENCOUNTER — Ambulatory Visit (INDEPENDENT_AMBULATORY_CARE_PROVIDER_SITE_OTHER): Payer: 59 | Admitting: Internal Medicine

## 2013-10-18 VITALS — BP 132/66 | HR 92 | Temp 98.1°F | Resp 18 | Ht 62.25 in | Wt 162.2 lb

## 2013-10-18 DIAGNOSIS — Z113 Encounter for screening for infections with a predominantly sexual mode of transmission: Secondary | ICD-10-CM

## 2013-10-18 DIAGNOSIS — E559 Vitamin D deficiency, unspecified: Secondary | ICD-10-CM

## 2013-10-18 DIAGNOSIS — I10 Essential (primary) hypertension: Secondary | ICD-10-CM

## 2013-10-18 DIAGNOSIS — Z1212 Encounter for screening for malignant neoplasm of rectum: Secondary | ICD-10-CM

## 2013-10-18 DIAGNOSIS — R740 Nonspecific elevation of levels of transaminase and lactic acid dehydrogenase [LDH]: Secondary | ICD-10-CM

## 2013-10-18 DIAGNOSIS — R7309 Other abnormal glucose: Secondary | ICD-10-CM

## 2013-10-18 DIAGNOSIS — R7401 Elevation of levels of liver transaminase levels: Secondary | ICD-10-CM

## 2013-10-18 DIAGNOSIS — Z0000 Encounter for general adult medical examination without abnormal findings: Secondary | ICD-10-CM

## 2013-10-18 DIAGNOSIS — Z111 Encounter for screening for respiratory tuberculosis: Secondary | ICD-10-CM

## 2013-10-18 DIAGNOSIS — R7402 Elevation of levels of lactic acid dehydrogenase (LDH): Secondary | ICD-10-CM

## 2013-10-18 DIAGNOSIS — Z79899 Other long term (current) drug therapy: Secondary | ICD-10-CM

## 2013-10-18 LAB — CBC WITH DIFFERENTIAL/PLATELET
Basophils Absolute: 0 K/uL (ref 0.0–0.1)
Basophils Relative: 0 % (ref 0–1)
Eosinophils Absolute: 0.1 K/uL (ref 0.0–0.7)
Eosinophils Relative: 2 % (ref 0–5)
HCT: 39.3 % (ref 36.0–46.0)
Hemoglobin: 13.3 g/dL (ref 12.0–15.0)
Lymphocytes Relative: 45 % (ref 12–46)
Lymphs Abs: 2.4 K/uL (ref 0.7–4.0)
MCH: 28.6 pg (ref 26.0–34.0)
MCHC: 33.8 g/dL (ref 30.0–36.0)
MCV: 84.5 fL (ref 78.0–100.0)
Monocytes Absolute: 0.4 K/uL (ref 0.1–1.0)
Monocytes Relative: 7 % (ref 3–12)
Neutro Abs: 2.5 K/uL (ref 1.7–7.7)
Neutrophils Relative %: 46 % (ref 43–77)
Platelets: 193 K/uL (ref 150–400)
RBC: 4.65 MIL/uL (ref 3.87–5.11)
RDW: 14.3 % (ref 11.5–15.5)
WBC: 5.4 K/uL (ref 4.0–10.5)

## 2013-10-18 LAB — HEMOGLOBIN A1C
Hgb A1c MFr Bld: 5.9 % — ABNORMAL HIGH
Mean Plasma Glucose: 123 mg/dL — ABNORMAL HIGH

## 2013-10-19 LAB — LIPID PANEL
Cholesterol: 204 mg/dL — ABNORMAL HIGH (ref 0–200)
HDL: 64 mg/dL (ref >39)
LDL Cholesterol: 115 mg/dL — ABNORMAL HIGH (ref 0–99)
Total CHOL/HDL Ratio: 3.2 Ratio
Triglycerides: 126 mg/dL (ref <150)
VLDL: 25 mg/dL (ref 0–40)

## 2013-10-19 LAB — HEPATIC FUNCTION PANEL
ALT: 10 U/L (ref 0–35)
AST: 15 U/L (ref 0–37)
Albumin: 3.8 g/dL (ref 3.5–5.2)
Alkaline Phosphatase: 71 U/L (ref 39–117)
Bilirubin, Direct: 0.1 mg/dL (ref 0.0–0.3)
Indirect Bilirubin: 0.3 mg/dL (ref 0.2–1.2)
Total Bilirubin: 0.4 mg/dL (ref 0.2–1.2)
Total Protein: 5.9 g/dL — ABNORMAL LOW (ref 6.0–8.3)

## 2013-10-19 LAB — HIV ANTIBODY (ROUTINE TESTING W REFLEX): HIV: NONREACTIVE

## 2013-10-19 LAB — BASIC METABOLIC PANEL WITH GFR
BUN: 9 mg/dL (ref 6–23)
CO2: 24 mEq/L (ref 19–32)
Calcium: 9.3 mg/dL (ref 8.4–10.5)
Chloride: 104 mEq/L (ref 96–112)
Creat: 0.84 mg/dL (ref 0.50–1.10)
GFR, Est African American: 85 mL/min
GFR, Est Non African American: 74 mL/min
Glucose, Bld: 122 mg/dL — ABNORMAL HIGH (ref 70–99)
Potassium: 3.7 mEq/L (ref 3.5–5.3)
Sodium: 139 mEq/L (ref 135–145)

## 2013-10-19 LAB — HEPATITIS B CORE ANTIBODY, TOTAL: Hep B Core Total Ab: NONREACTIVE

## 2013-10-19 LAB — HEPATITIS A ANTIBODY, TOTAL: Hep A Total Ab: NONREACTIVE

## 2013-10-19 LAB — URINALYSIS, MICROSCOPIC ONLY
Casts: NONE SEEN
Crystals: NONE SEEN

## 2013-10-19 LAB — MICROALBUMIN / CREATININE URINE RATIO
Creatinine, Urine: 98.5 mg/dL
Microalb Creat Ratio: 5.1 mg/g (ref 0.0–30.0)
Microalb, Ur: 0.5 mg/dL (ref 0.00–1.89)

## 2013-10-19 LAB — VITAMIN D 25 HYDROXY (VIT D DEFICIENCY, FRACTURES): Vit D, 25-Hydroxy: 105 ng/mL — ABNORMAL HIGH (ref 30–89)

## 2013-10-19 LAB — MAGNESIUM: Magnesium: 1.6 mg/dL (ref 1.5–2.5)

## 2013-10-19 LAB — RPR

## 2013-10-19 LAB — HEPATITIS B SURFACE ANTIBODY,QUALITATIVE: Hep B S Ab: POSITIVE — AB

## 2013-10-19 LAB — INSULIN, FASTING: Insulin fasting, serum: 41 u[IU]/mL — ABNORMAL HIGH (ref 3–28)

## 2013-10-19 LAB — TSH: TSH: 0.541 u[IU]/mL (ref 0.350–4.500)

## 2013-10-19 LAB — HEPATITIS C ANTIBODY: HCV Ab: NEGATIVE

## 2013-10-19 LAB — VITAMIN B12: Vitamin B-12: 726 pg/mL (ref 211–911)

## 2013-10-21 LAB — HEPATITIS B E ANTIBODY: Hepatitis Be Antibody: NEGATIVE

## 2013-10-22 ENCOUNTER — Telehealth: Payer: Self-pay | Admitting: *Deleted

## 2013-10-22 LAB — TB SKIN TEST
Induration: 0 mm
TB Skin Test: NEGATIVE

## 2013-10-22 NOTE — Telephone Encounter (Signed)
Reviewed lab results with patient.

## 2014-01-18 ENCOUNTER — Encounter: Payer: Self-pay | Admitting: Physician Assistant

## 2014-01-18 ENCOUNTER — Ambulatory Visit (INDEPENDENT_AMBULATORY_CARE_PROVIDER_SITE_OTHER): Payer: 59 | Admitting: Physician Assistant

## 2014-01-18 VITALS — BP 118/60 | HR 68 | Temp 98.6°F | Resp 16 | Wt 159.0 lb

## 2014-01-18 DIAGNOSIS — R7309 Other abnormal glucose: Secondary | ICD-10-CM

## 2014-01-18 DIAGNOSIS — I10 Essential (primary) hypertension: Secondary | ICD-10-CM

## 2014-01-18 DIAGNOSIS — E559 Vitamin D deficiency, unspecified: Secondary | ICD-10-CM

## 2014-01-18 DIAGNOSIS — E782 Mixed hyperlipidemia: Secondary | ICD-10-CM

## 2014-01-18 DIAGNOSIS — E785 Hyperlipidemia, unspecified: Secondary | ICD-10-CM

## 2014-01-18 DIAGNOSIS — Z79899 Other long term (current) drug therapy: Secondary | ICD-10-CM

## 2014-01-18 LAB — CBC WITH DIFFERENTIAL/PLATELET
Basophils Absolute: 0 10*3/uL (ref 0.0–0.1)
Basophils Relative: 0 % (ref 0–1)
Eosinophils Absolute: 0.2 10*3/uL (ref 0.0–0.7)
Eosinophils Relative: 3 % (ref 0–5)
HCT: 39.8 % (ref 36.0–46.0)
Hemoglobin: 13.3 g/dL (ref 12.0–15.0)
Lymphocytes Relative: 42 % (ref 12–46)
Lymphs Abs: 2.3 10*3/uL (ref 0.7–4.0)
MCH: 28.4 pg (ref 26.0–34.0)
MCHC: 33.4 g/dL (ref 30.0–36.0)
MCV: 85 fL (ref 78.0–100.0)
Monocytes Absolute: 0.4 10*3/uL (ref 0.1–1.0)
Monocytes Relative: 8 % (ref 3–12)
Neutro Abs: 2.6 10*3/uL (ref 1.7–7.7)
Neutrophils Relative %: 47 % (ref 43–77)
Platelets: 186 10*3/uL (ref 150–400)
RBC: 4.68 MIL/uL (ref 3.87–5.11)
RDW: 14.3 % (ref 11.5–15.5)
WBC: 5.5 10*3/uL (ref 4.0–10.5)

## 2014-01-18 NOTE — Patient Instructions (Signed)
   Bad carbs also include fruit juice, alcohol, and sweet tea. These are empty calories that do not signal to your brain that you are full.   Please remember the good carbs are still carbs which convert into sugar. So please measure them out no more than 1/2-1 cup of rice, oatmeal, pasta, and beans.  Veggies are however free foods! Pile them on.   I like lean protein at every meal such as chicken, [place], pork chops, cottage cheese, etc. Just do not fry these meats and please center your meal around vegetable, the meats should be a side dish.   No all fruit is created equal. Please see the list below, the fruit at the bottom is higher in sugars than the fruit at the top    Your LDL is the bad cholesterol that can lead to heart attack and stroke. To lower your number you can decrease your fatty foods, red meat, cheese, milk and increase fiber like whole grains and veggies. You can also add a fiber supplement like Metamucil or Benefiber.

## 2014-01-18 NOTE — Progress Notes (Signed)
HPI 63 y.o. female  presents for 3 month follow up with hypertension, hyperlipidemia, prediabetes and vitamin D. Her blood pressure has been controlled at home, today their BP is BP: 118/60 mmHg She does workout, and wants to start walking more. She denies chest pain, shortness of breath, dizziness.  She is not on cholesterol medication and denies myalgias. Her cholesterol is at goal. The cholesterol last visit was:   Lab Results  Component Value Date   CHOL 204* 10/18/2013   HDL 64 10/18/2013   LDLCALC 115* 10/18/2013   LDLDIRECT 139.2 04/29/2012   TRIG 126 10/18/2013   CHOLHDL 3.2 10/18/2013   She has been working on diet and exercise for prediabetes, and denies polydipsia, polyuria and visual disturbances. Last A1C in the office was:  Lab Results  Component Value Date   HGBA1C 5.9* 10/18/2013   Patient is on Vitamin D supplement.    Current Medications:  Current Outpatient Prescriptions on File Prior to Visit  Medication Sig Dispense Refill  . aspirin 81 MG tablet Take 81 mg by mouth daily.      . cyclobenzaprine (FLEXERIL) 10 MG tablet 1-2 pill at night for jaw/neck pain.  60 tablet  1  . Multiple Vitamin (MULTIVITAMIN) tablet Take 1 tablet by mouth daily.      . sertraline (ZOLOFT) 50 MG tablet Take 1 tablet (50 mg total) by mouth daily.  90 tablet  1   No current facility-administered medications on file prior to visit.   Medical History:  Past Medical History  Diagnosis Date  . DCIS (ductal carcinoma in situ) of breast 04/29/2012  . Hyperlipidemia   . Vitamin D deficiency    Allergies:  Allergies  Allergen Reactions  . Darvon [Propoxyphene]   . Red Yeast Rice [Cholestin]     Dizzy    Review of Systems: [X]  = complains of  [ ]  = denies  General: Fatigue [ ]  Fever [ ]  Chills [ ]  Weakness [ ]   Insomnia [ ]  Eyes: Redness [ ]  Blurred vision [ ]  Diplopia [ ]   ENT: Congestion [ ]  Sinus Pain [ ]  Post Nasal Drip [ ]  Sore Throat [ ]  Earache [ ]   Cardiac: Chest pain/pressure [ ]   SOB [ ]  Orthopnea [ ]   Palpitations [ ]   Paroxysmal nocturnal dyspnea[ ]  Claudication [ ]  Edema [ ]   Pulmonary: Cough [ ]  Wheezing[ ]   SOB [ ]   Snoring [ ]   GI: Nausea [ ]  Vomiting[ ]  Dysphagia[ ]  Heartburn[ ]  Abdominal pain [ ]  Constipation [ ] ; Diarrhea [ ] ; BRBPR [ ]  Melena[ ]  GU: Hematuria[ ]  Dysuria [ ]  Nocturia[ ]  Urgency [ ]   Hesitancy [ ]  Discharge [ ]  Neuro: Headaches[ ]  Vertigo[ ]  Paresthesias[ ]  Spasm [ ]  Speech changes [ ]  Incoordination [ ]   Ortho: Arthritis [ ]  Joint pain [ ]  Muscle pain [ ]  Joint swelling [ ]  Back Pain [ ]  Skin:  Rash [ ]   Pruritis [ ]  Change in skin lesion [ ]   Psych: Depression[ ]  Anxiety[ ]  Confusion [ ]  Memory loss [ ]   Heme/Lypmh: Bleeding [ ]  Bruising [ ]  Enlarged lymph nodes [ ]   Endocrine: Visual blurring [ ]  Paresthesia [ ]  Polyuria [ ]  Polydypsea [ ]    Heat/cold intolerance [ ]  Hypoglycemia [ ]   Family history- Review and unchanged Social history- Review and unchanged Physical Exam: BP 118/60  Pulse 68  Temp(Src) 98.6 F (37 C)  Resp 16  Wt 159 lb (72.122 kg) Wt Readings  from Last 3 Encounters:  01/18/14 159 lb (72.122 kg)  10/18/13 162 lb 3.2 oz (73.573 kg)  09/22/13 163 lb (73.936 kg)   General Appearance: Well nourished, in no apparent distress. Eyes: PERRLA, EOMs, conjunctiva no swelling or erythema Sinuses: No Frontal/maxillary tenderness ENT/Mouth: Ext aud canals clear, TMs without erythema, bulging. No erythema, swelling, or exudate on post pharynx.  Tonsils not swollen or erythematous. Hearing normal.  Neck: Supple, thyroid normal.  Respiratory: Respiratory effort normal, BS equal bilaterally without rales, rhonchi, wheezing or stridor.  Cardio: RRR with no MRGs. Brisk peripheral pulses without edema.  Abdomen: Soft, + BS.  Non tender, no guarding, rebound, hernias, masses. Lymphatics: Non tender without lymphadenopathy.  Musculoskeletal: Full ROM, 5/5 strength, normal gait.  Skin: Warm, dry without rashes, lesions, ecchymosis.   Neuro: Cranial nerves intact. Normal muscle tone, no cerebellar symptoms. Sensation intact.  Psych: Awake and oriented X 3, normal affect, Insight and Judgment appropriate.   Assessment and Plan:  Hypertension: Continue medication, monitor blood pressure at home. Continue DASH diet. Cholesterol: Continue diet and exercise. Check cholesterol.  Pre-diabetes-Continue diet and exercise. Check A1C Vitamin D Def- check level and continue medications.   Continue diet and meds as discussed. Further disposition pending results of labs.  Regina Shaw 1:42 PM

## 2014-01-19 LAB — LIPID PANEL
Cholesterol: 222 mg/dL — ABNORMAL HIGH (ref 0–200)
HDL: 66 mg/dL (ref >39)
LDL Cholesterol: 116 mg/dL — ABNORMAL HIGH (ref 0–99)
Total CHOL/HDL Ratio: 3.4 Ratio
Triglycerides: 201 mg/dL — ABNORMAL HIGH (ref <150)
VLDL: 40 mg/dL (ref 0–40)

## 2014-01-19 LAB — BASIC METABOLIC PANEL WITH GFR
BUN: 12 mg/dL (ref 6–23)
CO2: 27 mEq/L (ref 19–32)
Calcium: 9.5 mg/dL (ref 8.4–10.5)
Chloride: 104 mEq/L (ref 96–112)
Creat: 0.7 mg/dL (ref 0.50–1.10)
GFR, Est African American: >89 mL/min
GFR, Est Non African American: >89 mL/min
Glucose, Bld: 90 mg/dL (ref 70–99)
Potassium: 4.1 mEq/L (ref 3.5–5.3)
Sodium: 140 mEq/L (ref 135–145)

## 2014-01-19 LAB — HEPATIC FUNCTION PANEL
ALT: 14 U/L (ref 0–35)
AST: 18 U/L (ref 0–37)
Albumin: 4 g/dL (ref 3.5–5.2)
Alkaline Phosphatase: 66 U/L (ref 39–117)
Bilirubin, Direct: 0.1 mg/dL (ref 0.0–0.3)
Indirect Bilirubin: 0.2 mg/dL (ref 0.2–1.2)
Total Bilirubin: 0.3 mg/dL (ref 0.2–1.2)
Total Protein: 6.4 g/dL (ref 6.0–8.3)

## 2014-01-19 LAB — VITAMIN D 25 HYDROXY (VIT D DEFICIENCY, FRACTURES): Vit D, 25-Hydroxy: 79 ng/mL (ref 30–89)

## 2014-01-19 LAB — HEMOGLOBIN A1C
Hgb A1c MFr Bld: 6 % — ABNORMAL HIGH (ref <5.7)
Mean Plasma Glucose: 126 mg/dL — ABNORMAL HIGH (ref <117)

## 2014-01-19 LAB — MAGNESIUM: Magnesium: 1.9 mg/dL (ref 1.5–2.5)

## 2014-01-19 LAB — INSULIN, FASTING: Insulin fasting, serum: 12 u[IU]/mL (ref 3–28)

## 2014-01-19 LAB — TSH: TSH: 0.693 u[IU]/mL (ref 0.350–4.500)

## 2014-04-08 ENCOUNTER — Other Ambulatory Visit: Payer: Self-pay | Admitting: Internal Medicine

## 2014-04-08 MED ORDER — PREDNISONE 20 MG PO TABS: ORAL_TABLET | ORAL | Status: DC

## 2014-04-08 MED ORDER — MECLIZINE HCL 25 MG PO TABS: 25.0000 mg | ORAL_TABLET | Freq: Three times a day (TID) | ORAL | Status: DC | PRN

## 2014-04-26 ENCOUNTER — Ambulatory Visit: Payer: Self-pay | Admitting: Physician Assistant

## 2014-04-26 ENCOUNTER — Ambulatory Visit: Payer: Self-pay | Admitting: Internal Medicine

## 2014-05-01 ENCOUNTER — Other Ambulatory Visit: Payer: Self-pay | Admitting: Internal Medicine

## 2014-05-02 ENCOUNTER — Ambulatory Visit (INDEPENDENT_AMBULATORY_CARE_PROVIDER_SITE_OTHER): Payer: Medicare Other | Admitting: Internal Medicine

## 2014-05-02 ENCOUNTER — Encounter: Payer: Self-pay | Admitting: Internal Medicine

## 2014-05-02 VITALS — BP 118/60 | HR 64 | Temp 99.0°F | Resp 16 | Ht 62.25 in | Wt 163.6 lb

## 2014-05-02 DIAGNOSIS — M5430 Sciatica, unspecified side: Secondary | ICD-10-CM

## 2014-05-02 DIAGNOSIS — M5431 Sciatica, right side: Secondary | ICD-10-CM

## 2014-05-02 MED ORDER — HYDROCODONE-ACETAMINOPHEN 5-325 MG PO TABS: ORAL_TABLET | ORAL | Status: DC

## 2014-05-02 MED ORDER — PREDNISONE 20 MG PO TABS
ORAL_TABLET | ORAL | Status: DC
Start: 2014-05-02 — End: 2014-06-05

## 2014-05-02 NOTE — Patient Instructions (Signed)
Sciatica Sciatica is pain, weakness, numbness, or tingling along the path of the sciatic nerve. The nerve starts in the lower back and runs down the back of each leg. The nerve controls the muscles in the lower leg and in the back of the knee, while also providing sensation to the back of the thigh, lower leg, and the sole of your foot. Sciatica is a symptom of another medical condition. For instance, nerve damage or certain conditions, such as a herniated disk or bone spur on the spine, pinch or put pressure on the sciatic nerve. This causes the pain, weakness, or other sensations normally associated with sciatica. Generally, sciatica only affects one side of the body. CAUSES   Herniated or slipped disc.  Degenerative disk disease.  A pain disorder involving the narrow muscle in the buttocks (piriformis syndrome).  Pelvic injury or fracture.  Pregnancy.  Tumor (rare). SYMPTOMS  Symptoms can vary from mild to very severe. The symptoms usually travel from the low back to the buttocks and down the back of the leg. Symptoms can include:  Mild tingling or dull aches in the lower back, leg, or hip.  Numbness in the back of the calf or sole of the foot.  Burning sensations in the lower back, leg, or hip.  Sharp pains in the lower back, leg, or hip.  Leg weakness.  Severe back pain inhibiting movement. These symptoms may get worse with coughing, sneezing, laughing, or prolonged sitting or standing. Also, being overweight may worsen symptoms. DIAGNOSIS  Your caregiver will perform a physical exam to look for common symptoms of sciatica. He or she may ask you to do certain movements or activities that would trigger sciatic nerve pain. Other tests may be performed to find the cause of the sciatica. These may include:  Blood tests.  X-rays.  Imaging tests, such as an MRI or CT scan. TREATMENT  Treatment is directed at the cause of the sciatic pain. Sometimes, treatment is not necessary  and the pain and discomfort goes away on its own. If treatment is needed, your caregiver may suggest:  Over-the-counter medicines to relieve pain.  Prescription medicines, such as anti-inflammatory medicine, muscle relaxants, or narcotics.  Applying heat or ice to the painful area.  Steroid injections to lessen pain, irritation, and inflammation around the nerve.  Reducing activity during periods of pain.  Exercising and stretching to strengthen your abdomen and improve flexibility of your spine. Your caregiver may suggest losing weight if the extra weight makes the back pain worse.  Physical therapy.  Surgery to eliminate what is pressing or pinching the nerve, such as a bone spur or part of a herniated disk. HOME CARE INSTRUCTIONS   Only take over-the-counter or prescription medicines for pain or discomfort as directed by your caregiver.  Apply ice to the affected area for 20 minutes, 3-4 times a day for the first 48-72 hours. Then try heat in the same way.  Exercise, stretch, or perform your usual activities if these do not aggravate your pain.  Attend physical therapy sessions as directed by your caregiver.  Keep all follow-up appointments as directed by your caregiver.  Do not wear high heels or shoes that do not provide proper support.  Check your mattress to see if it is too soft. A firm mattress may lessen your pain and discomfort. SEEK IMMEDIATE MEDICAL CARE IF:   You lose control of your bowel or bladder (incontinence).  You have increasing weakness in the lower back, pelvis, buttocks,   or legs.  You have redness or swelling of your back.  You have a burning sensation when you urinate.  You have pain that gets worse when you lie down or awakens you at night.  Your pain is worse than you have experienced in the past.  Your pain is lasting longer than 4 weeks.  You are suddenly losing weight without reason. MAKE SURE YOU:  Understand these  instructions.  Will watch your condition.  Will get help right away if you are not doing well or get worse.   Degenerative Disk Disease Degenerative disk disease is a condition caused by the changes that occur in the cushions of the backbone (spinal disks) as you grow older. Spinal disks are soft and compressible disks located between the bones of the spine (vertebrae). They act like shock absorbers. Degenerative disk disease can affect the whole spine. However, the neck and lower back are most commonly affected. Many changes can occur in the spinal disks with aging, such as:  The spinal disks may dry and shrink.  Small tears may occur in the tough, outer covering of the disk (annulus).  The disk space may become smaller due to loss of water.  Abnormal growths in the bone (spurs) may occur. This can put pressure on the nerve roots exiting the spinal canal, causing pain.  The spinal canal may become narrowed. CAUSES  Degenerative disk disease is a condition caused by the changes that occur in the spinal disks with aging. The exact cause is not known, but there is a genetic basis for many patients. Degenerative changes can occur due to loss of fluid in the disk. This makes the disk thinner and reduces the space between the backbones. Small cracks can develop in the outer layer of the disk. This can lead to the breakdown of the disk. You are more likely to get degenerative disk disease if you are overweight. Smoking cigarettes and doing heavy work such as weightlifting can also increase your risk of this condition. Degenerative changes can start after a sudden injury. Growth of bone spurs can compress the nerve roots and cause pain.  SYMPTOMS  The symptoms vary from person to person. Some people may have no pain, while others have severe pain. The pain may be so severe that it can limit your activities. The location of the pain depends on the part of your backbone that is affected. You will have  neck or arm pain if a disk in the neck area is affected. You will have pain in your back, buttocks, or legs if a disk in the lower back is affected. The pain becomes worse while bending, reaching up, or with twisting movements. The pain may start gradually and then get worse as time passes. It may also start after a major or minor injury. You may feel numbness or tingling in the arms or legs.  DIAGNOSIS  Your caregiver will ask you about your symptoms and about activities or habits that may cause the pain. He or she may also ask about any injuries, diseases, or treatments you have had earlier. Your caregiver will examine you to check for the range of movement that is possible in the affected area, to check for strength in your extremities, and to check for sensation in the areas of the arms and legs supplied by different nerve roots. An X-ray of the spine may be taken. Your caregiver may suggest other imaging tests, such as magnetic resonance imaging (MRI), if needed.  TREATMENT  Treatment includes rest, modifying your activities, and applying ice and heat. Your caregiver may prescribe medicines to reduce your pain and may ask you to do some exercises to strengthen your back. In some cases, you may need surgery. You and your caregiver will decide on the treatment that is best for you. HOME CARE INSTRUCTIONS   Follow proper lifting and walking techniques as advised by your caregiver.  Maintain good posture.  Exercise regularly as advised.  Perform relaxation exercises.  Change your sitting, standing, and sleeping habits as advised. Change positions frequently.  Lose weight as advised.  Stop smoking if you smoke.  Wear supportive footwear. SEEK MEDICAL CARE IF:  Your pain does not go away within 1 to 4 weeks. SEEK IMMEDIATE MEDICAL CARE IF:   Your pain is severe.  You notice weakness in your arms, hands, or legs.  You begin to lose control of your bladder or bowel movements. MAKE SURE  YOU:   Understand these instructions.  Will watch your condition.  Will get help right away if you are not doing well or get worse. Document Released: 06/16/2007 Document Revised: 11/11/2011 Document Reviewed: 12/21/2013 [facility] Patient Information 2015 [place], [place]. This information is not intended to replace advice given to you by your health care provider. Make sure you discuss any questions you have with your health care provider.

## 2014-05-02 NOTE — Progress Notes (Signed)
   Subjective:    Patient ID: Regina Shaw, female    DOB: Sep 30, 1950, 63 y.o.   MRN: [id number]  HPI Patient presents with a 2 day hx/o unprovoked Left sciatica type pain. She rated the pain 8/10 yesterday and 3-4/10 at present. No prior hx/o DDD or LBP. Denies bladder issues or foot drop. She did start prednisone yesterday.  Medication Sig  . aspirin 81 MG tablet Take 81 mg by mouth daily.  Marland Kitchen VITAMIN D  Take 4,000 Int'l Units  daily.  . Meclizine 25 MG tablet Take 1 tablet  3  times daily as needed  . MULTIVITAMIN Take 1 tablet  daily.   Allergies  Allergen Reactions  . Darvon [Propoxyphene]   . Red Yeast Rice [Cholestin]     Dizzy   Past Medical History  Diagnosis Date  . DCIS (ductal carcinoma in situ) of breast 04/29/2012  . Hyperlipidemia   . Vitamin D deficiency    Review of Systems In addition to the HPI above,  No Fever-chills,  No Headache, No changes with Vision or hearing,  No Chest pain or productive Cough or Shortness of Breath,  No Abdominal pain, No Nausea or Vomitting, Bowel movements are regular,  No Blood in stool or Urine, No dysuria, No new skin rashes or bruises,  No new joints pains-aches,  No recent weight loss,  No polyuria, polydypsia or polyphagia,  A full 10 point Review of Systems was done, except as stated above, all other Review of Systems were negative  Objective:   Physical Exam BP 118/60  P 64  T 99 F   Resp 16  Ht 5' 2.25"   Wt 163 lb 9.6 oz   BMI 29.69  HEENT - Neg. Neck - supple. Nl Thyroid.  Carotids 2+ & No bruits, nodes, JVD Chest - Clear equal BS w/o Rales, rhonchi, wheezes. Cor - Nl HS. RRR w/o sig MGR. PP 1(+). No edema. MS- FROM w/o deformities. Muscle power, tone and bulk Nl. Gait Nl. Neuro - No obvious Cr N abnormalities. Sensory, motor and Cerebellar functions appear Nl w/o focal abnormalities. Psyche - Mental status normal & appropriate.  No delusions, ideations or obvious mood abnormalities.  Assessment & Plan:    1. Sciatica, Left Rx Prednisone 20 mg #20 - pulse & taper Rx Norco 5  (# 50)

## 2014-05-05 ENCOUNTER — Ambulatory Visit (INDEPENDENT_AMBULATORY_CARE_PROVIDER_SITE_OTHER): Payer: Medicare Other | Admitting: Physician Assistant

## 2014-05-05 ENCOUNTER — Encounter: Payer: Self-pay | Admitting: Physician Assistant

## 2014-05-05 VITALS — BP 130/68 | HR 56 | Temp 98.1°F | Resp 16 | Ht 62.25 in | Wt 164.0 lb

## 2014-05-05 DIAGNOSIS — M545 Low back pain, unspecified: Secondary | ICD-10-CM

## 2014-05-05 DIAGNOSIS — E559 Vitamin D deficiency, unspecified: Secondary | ICD-10-CM

## 2014-05-05 DIAGNOSIS — Z79899 Other long term (current) drug therapy: Secondary | ICD-10-CM

## 2014-05-05 DIAGNOSIS — R7309 Other abnormal glucose: Secondary | ICD-10-CM

## 2014-05-05 DIAGNOSIS — Z1331 Encounter for screening for depression: Secondary | ICD-10-CM

## 2014-05-05 DIAGNOSIS — D0511 Intraductal carcinoma in situ of right breast: Secondary | ICD-10-CM

## 2014-05-05 DIAGNOSIS — Z0000 Encounter for general adult medical examination without abnormal findings: Secondary | ICD-10-CM

## 2014-05-05 DIAGNOSIS — Z789 Other specified health status: Secondary | ICD-10-CM

## 2014-05-05 DIAGNOSIS — I10 Essential (primary) hypertension: Secondary | ICD-10-CM

## 2014-05-05 DIAGNOSIS — Z23 Encounter for immunization: Secondary | ICD-10-CM

## 2014-05-05 DIAGNOSIS — E785 Hyperlipidemia, unspecified: Secondary | ICD-10-CM

## 2014-05-05 NOTE — Progress Notes (Signed)
MEDICARE ANNUAL WELLNESS VISIT AND FOLLOW UP  Assessment:   1. Labile Hypertension - continue medications, DASH diet, exercise and monitor at home. Call if greater than 130/80.   2. DCIS (ductal carcinoma in situ) of breast, right Continue MGM and self breast exams  3. Encounter for long-term (current) use of other medications  4. Hyperlipidemia - check lipids, decrease fatty foods, increase activity.   5. PreDiabetes Discussed general issues about diabetes pathophysiology and management., Educational material distributed., Suggested low cholesterol diet., Encouraged aerobic exercise., Discussed foot care., Reminded to get yearly retinal exam. - [facility] DIABETES FOOT EXAM  6. Unspecified vitamin D deficiency  7. Low back pain radiating to right lower extremity - MR Lumbar Spine Wo Contrast; Future  8. Need for prophylactic vaccination and inoculation against influenza - Flu vaccine HIGH DOSE PF  9. Need for vaccination with 13-polyvalent pneumococcal conjugate vaccine - Pneumococcal conjugate vaccine 13-valent IM   Plan:   During the course of the visit the patient was educated and counseled about appropriate screening and preventive services including:    Pneumococcal vaccine   Influenza vaccine  Td vaccine  Screening electrocardiogram  Screening mammography  Bone densitometry screening  Colorectal cancer screening  Diabetes screening  Glaucoma screening  Nutrition counseling   Advanced directives: given info/requested  Screening recommendations, referrals:  Vaccinations: Tdap vaccine due 2016 Influenza vaccine requested Pneumococcal vaccine requested Shingles vaccine not indicated Hep B vaccine not indicated  Nutrition assessed and recommended  Colonoscopy due 2016 Mammogram requested Pap smear not indicated Pelvic exam not indicated Recommended yearly ophthalmology/optometry visit for glaucoma screening and checkup Recommended yearly dental visit  for hygiene and checkup Advanced directives - requested  Conditions/risks identified: BMI: Discussed weight loss, diet, and increase physical activity.  Increase physical activity: AHA recommends 150 minutes of physical activity a week.  Medications reviewed DEXA- done at [facility] Diabetes is at goal, ACE/ARB therapy: No, Reason not on Ace Inhibitor/ARB therapy:  only PreDM Urinary Incontinence is not an issue: discussed non pharmacology and pharmacology options.  Fall risk: low- discussed PT, home fall assessment, medications.    Subjective:   Regina Shaw is a 63 y.o. female who presents for Medicare Annual Wellness Visit and 3 month follow up on hypertension, prediabetes, hyperlipidemia, vitamin D def.  Date of last medicare wellness visit is unknown.   Her blood pressure has been controlled at home, today their BP is BP: 130/68 mmHg She does workout. She denies chest pain, shortness of breath, dizziness.  She is not on cholesterol medication and denies myalgias. Her cholesterol is at goal. The cholesterol last visit was:   Lab Results  Component Value Date   CHOL 222* 01/18/2014   HDL 66 01/18/2014   LDLCALC 116* 01/18/2014   LDLDIRECT 139.2 04/29/2012   TRIG 201* 01/18/2014   CHOLHDL 3.4 01/18/2014   She has been working on diet and exercise for prediabetes, and denies paresthesia of the feet, polydipsia and polyuria. Last A1C in the office was:  Lab Results  Component Value Date   HGBA1C 6.0* 01/18/2014   Patient is on Vitamin D supplement. Lab Results  Component Value Date   VD25OH 65 01/18/2014     She has a history of L4/L5 HNP  in 2000 with Dr. Tonita Cong. She woke up this back Thursday with right hip pain and pain radiation down the lateral then posterior leg to her foot, no bowel/bladder problems. She does have some tingling/weakness in her right leg, no falls in the  past year. She was put on Prednisone and given Norco on 08/31 but it has not improved. She saw a chiropractor  for her pain last Saturday and had an Xray. With her previous back history and current symptoms,  we will order an MRI.   Names of Other Physician/Practitioners you currently use: 1. [facility]- here for primary care 2. Changing doctors, has appointment in Oct with eye doctor 3. Unknown, has been seeing Dr. [facility] but changing dentist, last visit 4 months ago Patient Care Team: Unk Pinto, MD as PCP - General (Internal Medicine) Ladene Artist, MD as Consulting Physician (Gastroenterology) Rozetta Nunnery, MD as Consulting Physician (Otolaryngology) Johnn Hai, MD as Consulting Physician (Orthopedic Surgery)  Medication Review Current Outpatient Prescriptions on File Prior to Visit  Medication Sig Dispense Refill  . aspirin 81 MG tablet Take 81 mg by mouth daily.      . Cholecalciferol (VITAMIN D PO) Take 4,000 Int'l Units by mouth daily.      Marland Kitchen HYDROcodone-acetaminophen (NORCO) 5-325 MG per tablet Take 1/2 to 1 tablet every 3 to 4 hourd as needed for severe pain  50 tablet  0  . meclizine (ANTIVERT) 25 MG tablet Take 1 tablet (25 mg total) by mouth 3 (three) times daily as needed for dizziness or nausea.  30 tablet  1  . Multiple Vitamin (MULTIVITAMIN) tablet Take 1 tablet by mouth daily.      . predniSONE (DELTASONE) 20 MG tablet 1 tab 3 x day for 3 days, then 1 tab 2 x day for 3 days, then 1 tab 1 x day for 5 days  20 tablet  0   No current facility-administered medications on file prior to visit.    Current Problems (verified) Patient Active Problem List   Diagnosis Date Noted  . Encounter for long-term (current) use of other medications 10/17/2013  . Labile Hypertension 09/07/2013  . PreDiabetes 09/07/2013  . Unspecified vitamin D deficiency 09/07/2013  . Hyperlipidemia   . Vitamin D deficiency   . DCIS (ductal carcinoma in situ) of breast 04/29/2012    Screening Tests Health Maintenance  Topic Date Due  .  Tetanus/tdap  02/26/1968  . Mammogram  08/02/2013  . Pneumococcal Polysaccharide Vaccine Age 32 And Over  02/25/2014  . Influenza Vaccine  04/02/2014  . Colonoscopy  04/02/2016  . Zostavax  Completed     Immunization History  Administered Date(s) Administered  . PPD Test 10/18/2013  . Zoster 11/06/2009   Preventative care: Last colonoscopy: 2006 due 2016 Last mammogram: AT [facility] 09/2013 and got 3D Last pap smear/pelvic exam: 2014 will not get another DEXA: 2015 T -1.1 Negative hepatitis panel 2014 Negative HIV 2014 CXR 2009 normal LMP age 44  Prior vaccinations: TD or Tdap: 2006  Influenza: 2014 DUE  Pneumococcal: 1994 next year Prevnar due: DUE Shingles/Zostavax: 2011  History reviewed: allergies, current medications, past family history, past medical history, past social history, past surgical history and problem list  Risk Factors: Osteoporosis: postmenopausal estrogen deficiency and dietary calcium and/or vitamin D deficiency History of fracture in the past year: no  Tobacco History  Substance Use Topics  . Smoking status: Former Research scientist (life sciences)  . Smokeless tobacco: Not on file  . Alcohol Use: No   She does not smoke.  Patient is a former smoker. Are there smokers in your home (other than you)?  No  Alcohol Current alcohol use: none  Caffeine Current caffeine use: coffee 1 /day  Exercise Current exercise: walking  Nutrition/Diet Current diet: in general, a "healthy" diet    Cardiac risk factors: advanced age (older than 2 for men, 24 for women), dyslipidemia, hypertension and sedentary lifestyle.  Depression Screen (Note: if answer to either of the following is "Yes", a more complete depression screening is indicated)   Q1: Over the past two weeks, have you felt down, depressed or hopeless? No  Q2: Over the past two weeks, have you felt little interest or pleasure in doing things? No  Have you lost interest or pleasure in daily life? No  Do  you often feel hopeless? No  Do you cry easily over simple problems? No  Activities of Daily Living In your present state of health, do you have any difficulty performing the following activities?:  Driving? No Managing money?  No Feeding yourself? No Getting from bed to chair? No Climbing a flight of stairs? No Preparing food and eating?: No Bathing or showering? No Getting dressed: No Getting to the toilet? No Using the toilet:No Moving around from place to place: No In the past year have you fallen or had a near fall?:No   Are you sexually active?  No  Do you have more than one partner?  No  Vision Difficulties: No  Hearing Difficulties: No Do you often ask people to speak up or repeat themselves? No Do you experience ringing or noises in your ears? No Do you have difficulty understanding soft or whispered voices? No  Cognition  Do you feel that you have a problem with memory?No  Do you often misplace items? No  Do you feel safe at home?  Yes  Advanced directives Does patient have a [place]? No Does patient have a Living Will? No   Objective:   Blood pressure 130/68, pulse 56, temperature 98.1 F (36.7 C), resp. rate 16, height 5' 2.25" (1.581 m), weight 164 lb (74.39 kg). Body mass index is 29.76 kg/(m^2).  General appearance: alert, no distress, WD/WN,  female Cognitive Testing  Alert? Yes  Normal Appearance?Yes  Oriented to person? Yes  Place? Yes   Time? Yes  Recall of three objects?  Yes  Can perform simple calculations? Yes  Displays appropriate judgment?Yes  Can read the correct time from a watch face?Yes  HEENT: normocephalic, sclerae anicteric, TMs pearly, nares patent, no discharge or erythema, pharynx normal Oral cavity: MMM, no lesions Neck: supple, no lymphadenopathy, no thyromegaly, no masses Heart: RRR, normal S1, S2, no murmurs Lungs: CTA bilaterally, no wheezes, rhonchi, or rales Abdomen: +bs, soft, non tender, non  distended, no masses, no hepatomegaly, no splenomegaly Musculoskeletal: nontender, no swelling, no obvious deformity Patient is able to ambulate well.  Gait is  antalgic. Straight leg raising negative bilaterally for radicular symptoms. Sensory exam in the legs is normal.  Knee reflexes are abnormal  right side Ankle reflexes are abnormal  right side Strength is normal and symmetric. There isparaspinal muscle spasm.  There is not midline tenderness. She does have some right greater trochanteric tenderness  ROM of spine with normal flexion, extension, lateral range of motion to the right and left, and rotation to the right and left.  Extremities: no edema, no cyanosis, no clubbing Pulses: 2+ symmetric, upper and lower extremities, normal cap refill Neurological: alert, oriented x 3, CN2-12 intact, strength normal upper extremities and lower extremities, sensation normal throughout, DTRs 2+ throughout except decreased right patellar, no cerebellar signs, gait antalgic Psychiatric: normal affect, behavior normal, pleasant  Breast: defer Gyn: defer Rectal: defer  Medicare Attestation I have personally reviewed: The patient's medical and social history Their use of alcohol, tobacco or illicit drugs Their current medications and supplements The patient's functional ability including ADLs,fall risks, home safety risks, cognitive, and hearing and visual impairment Diet and physical activities Evidence for depression or mood disorders  The patient's weight, height, BMI, and visual acuity have been recorded in the chart.  I have made referrals, counseling, and provided education to the patient based on review of the above and I have provided the patient with a written personalized care plan for preventive services.     Vicie Mutters, PA-C   05/05/2014

## 2014-05-05 NOTE — Patient Instructions (Signed)
Use a dropper to put olive oil or canola oil in the effected ear- 2-3 times a week. Let it soak for 20-30 min then you can take a shower or use a baby bulb with warm water to wash out the ear wax.  Do not use Qtips  Preventative Care for Adults - Female      MAINTAIN REGULAR HEALTH EXAMS:  A routine yearly physical is a good way to check in with your primary care provider about your health and preventive screening. It is also an opportunity to share updates about your health and any concerns you have, and receive a thorough all-over exam.   Most health insurance companies pay for at least some preventative services.  Check with your health plan for specific coverages.  WHAT PREVENTATIVE SERVICES DO WOMEN NEED?  Adult women should have their weight and blood pressure checked regularly.   Women age 65 and older should have their cholesterol levels checked regularly.  Women should be screened for cervical cancer with a Pap smear and pelvic exam beginning at either age 35, or 3 years after they become sexually activity.    Breast cancer screening generally begins at age 71 with a mammogram and breast exam by your primary care provider.    Beginning at age 23 and continuing to age 61, women should be screened for colorectal cancer.  Certain people may need continued testing until age 78.  Updating vaccinations is part of preventative care.  Vaccinations help protect against diseases such as the flu.  Osteoporosis is a disease in which the bones lose minerals and strength as we age. Women ages 15 and over should discuss this with their caregivers, as should women after menopause who have other risk factors.  Lab tests are generally done as part of preventative care to screen for anemia and blood disorders, to screen for problems with the kidneys and liver, to screen for bladder problems, to check blood sugar, and to check your cholesterol level.  Preventative services generally include  counseling about diet, exercise, avoiding tobacco, drugs, excessive alcohol consumption, and sexually transmitted infections.    GENERAL RECOMMENDATIONS FOR GOOD HEALTH:  Healthy diet:  Eat a variety of foods, including fruit, vegetables, animal or vegetable protein, such as meat, fish, chicken, and eggs, or beans, lentils, tofu, and grains, such as rice.  Drink plenty of water daily.  Decrease saturated fat in the diet, avoid lots of red meat, processed foods, sweets, fast foods, and fried foods.  Exercise:  Aerobic exercise helps maintain good heart health. At least 30-40 minutes of moderate-intensity exercise is recommended. For example, a brisk walk that increases your heart rate and breathing. This should be done on most days of the week.   Find a type of exercise or a variety of exercises that you enjoy so that it becomes a part of your daily life.  Examples are running, walking, swimming, water aerobics, and biking.  For motivation and support, explore group exercise such as aerobic class, spin class, Zumba, Yoga,or  martial arts, etc.    Set exercise goals for yourself, such as a certain weight goal, walk or run in a race such as a 5k walk/run.  Speak to your primary care provider about exercise goals.  Disease prevention:  If you smoke or chew tobacco, find out from your caregiver how to quit. It can literally save your life, no matter how long you have been a tobacco user. If you do not use tobacco, never begin.  Maintain a healthy diet and normal weight. Increased weight leads to problems with blood pressure and diabetes.   The Body Mass Index or BMI is a way of measuring how much of your body is fat. Having a BMI above 27 increases the risk of heart disease, diabetes, hypertension, stroke and other problems related to obesity. Your caregiver can help determine your BMI and based on it develop an exercise and dietary program to help you achieve or maintain this important  measurement at a healthful level.  High blood pressure causes heart and blood vessel problems.  Persistent high blood pressure should be treated with medicine if weight loss and exercise do not work.   Fat and cholesterol leaves deposits in your arteries that can block them. This causes heart disease and vessel disease elsewhere in your body.  If your cholesterol is found to be high, or if you have heart disease or certain other medical conditions, then you may need to have your cholesterol monitored frequently and be treated with medication.   Ask if you should have a cardiac stress test if your history suggests this. A stress test is a test done on a treadmill that looks for heart disease. This test can find disease prior to there being a problem.  Menopause can be associated with physical symptoms and risks. Hormone replacement therapy is available to decrease these. You should talk to your caregiver about whether starting or continuing to take hormones is right for you.   Osteoporosis is a disease in which the bones lose minerals and strength as we age. This can result in serious bone fractures. Risk of osteoporosis can be identified using a bone density scan. Women ages 68 and over should discuss this with their caregivers, as should women after menopause who have other risk factors. Ask your caregiver whether you should be taking a calcium supplement and Vitamin D, to reduce the rate of osteoporosis.   Avoid drinking alcohol in excess (more than two drinks per day).  Avoid use of street drugs. Do not share needles with anyone. Ask for professional help if you need assistance or instructions on stopping the use of alcohol, cigarettes, and/or drugs.  Brush your teeth twice a day with fluoride toothpaste, and floss once a day. Good oral hygiene prevents tooth decay and gum disease. The problems can be painful, unattractive, and can cause other health problems. Visit your dentist for a routine oral  and dental check up and preventive care every 6-12 months.   Look at your skin regularly.  Use a mirror to look at your back. Notify your caregivers of changes in moles, especially if there are changes in shapes, colors, a size larger than a pencil eraser, an irregular border, or development of new moles.  Safety:  Use seatbelts 100% of the time, whether driving or as a passenger.  Use safety devices such as hearing protection if you work in environments with loud noise or significant background noise.  Use safety glasses when doing any work that could send debris in to the eyes.  Use a helmet if you ride a bike or motorcycle.  Use appropriate safety gear for contact sports.  Talk to your caregiver about gun safety.  Use sunscreen with a SPF (or skin protection factor) of 15 or greater.  Lighter skinned people are at a greater risk of skin cancer. Don't forget to also wear sunglasses in order to protect your eyes from too much damaging sunlight. Damaging sunlight can accelerate cataract  formation.   Practice safe sex. Use condoms. Condoms are used for birth control and to help reduce the spread of sexually transmitted infections (or STIs).  Some of the STIs are gonorrhea (the clap), chlamydia, syphilis, trichomonas, herpes, HPV (human papilloma virus) and HIV (human immunodeficiency virus) which causes AIDS. The herpes, HIV and HPV are viral illnesses that have no cure. These can result in disability, cancer and death.   Keep carbon monoxide and smoke detectors in your home functioning at all times. Change the batteries every 6 months or use a model that plugs into the wall.   Vaccinations:  Stay up to date with your tetanus shots and other required immunizations. You should have a booster for tetanus every 10 years. Be sure to get your flu shot every year, since 5%-20% of the [place] population comes down with the flu. The flu vaccine changes each year, so being vaccinated once is not enough. Get your  shot in the fall, before the flu season peaks.   Other vaccines to consider:  Human Papilloma Virus or HPV causes cancer of the cervix, and other infections that can be transmitted from person to person. There is a vaccine for HPV, and females should get immunized between the ages of 43 and 50. It requires a series of 3 shots.   Pneumococcal vaccine to protect against certain types of pneumonia.  This is normally recommended for adults age 83 or older.  However, adults younger than 65 years old with certain underlying conditions such as diabetes, heart or lung disease should also receive the vaccine.  Shingles vaccine to protect against Varicella Zoster if you are older than age 7, or younger than 65 years old with certain underlying illness.  Hepatitis A vaccine to protect against a form of infection of the liver by a virus acquired from food.  Hepatitis B vaccine to protect against a form of infection of the liver by a virus acquired from blood or body fluids, particularly if you work in health care.  If you plan to travel internationally, check with your local health department for specific vaccination recommendations.  Cancer Screening:  Breast cancer screening is essential to preventive care for women. All women age 58 and older should perform a breast self-exam every month. At age 29 and older, women should have their caregiver complete a breast exam each year. Women at ages 76 and older should have a mammogram (x-ray film) of the breasts. Your caregiver can discuss how often you need mammograms.    Cervical cancer screening includes taking a Pap smear (sample of cells examined under a microscope) from the cervix (end of the uterus). It also includes testing for HPV (Human Papilloma Virus, which can cause cervical cancer). Screening and a pelvic exam should begin at age 61, or 3 years after a woman becomes sexually active. Screening should occur every year, with a Pap smear but no HPV  testing, up to age 23. After age 2, you should have a Pap smear every 3 years with HPV testing, if no HPV was found previously.   Most routine colon cancer screening begins at the age of 58. On a yearly basis, doctors may provide special easy to use take-home tests to check for hidden blood in the stool. Sigmoidoscopy or colonoscopy can detect the earliest forms of colon cancer and is life saving. These tests use a small camera at the end of a tube to directly examine the colon. Speak to your caregiver about this at  age 27, when routine screening begins (and is repeated every 5 years unless early forms of pre-cancerous polyps or small growths are found).

## 2014-05-11 ENCOUNTER — Ambulatory Visit
Admission: RE | Admit: 2014-05-11 | Discharge: 2014-05-11 | Disposition: A | Discharge status: Home / Self Care | Payer: Medicare Other | Source: Ambulatory Visit | Attending: Physician Assistant | Admitting: Physician Assistant

## 2014-05-11 DIAGNOSIS — M545 Low back pain, unspecified: Secondary | ICD-10-CM

## 2014-05-12 ENCOUNTER — Other Ambulatory Visit: Payer: Self-pay

## 2014-05-12 DIAGNOSIS — M549 Dorsalgia, unspecified: Secondary | ICD-10-CM

## 2014-05-13 ENCOUNTER — Telehealth: Payer: Self-pay | Admitting: *Deleted

## 2014-05-13 NOTE — Telephone Encounter (Signed)
Spoke to patient about pain meds. (see next entry)

## 2014-05-13 NOTE — Telephone Encounter (Signed)
Patient called and states pain med not helping back pain.Patient has orthopedic appointment on 05/16/2014.  Patient states she took Norco 2 tabs every 2 hours on 05/09/2014 and 05/10/2014.  Per Dr Melford Aase, that dosage is too high and patient needs to reduce dose.  Per Dr Melford Aase, ok to increase Norco to 2 tabs every 4 hours but should not take with Tylenol.  Patient can not take more than 8 Tylenol per day.

## 2014-05-14 ENCOUNTER — Other Ambulatory Visit: Payer: 59

## 2014-05-24 ENCOUNTER — Telehealth: Payer: Self-pay

## 2014-05-24 NOTE — Telephone Encounter (Signed)
[facility] called requesting MRI results. Faxed results to [phone] Attn:Amanda

## 2014-06-05 ENCOUNTER — Emergency Department (HOSPITAL_COMMUNITY): Payer: Medicare Other

## 2014-06-05 ENCOUNTER — Encounter (HOSPITAL_COMMUNITY): Payer: Self-pay | Admitting: Emergency Medicine

## 2014-06-05 ENCOUNTER — Emergency Department (HOSPITAL_COMMUNITY)
Admission: EM | Admit: 2014-06-05 | Discharge: 2014-06-05 | Disposition: A | Discharge status: Home / Self Care | Payer: Medicare Other | Attending: Emergency Medicine | Admitting: Emergency Medicine

## 2014-06-05 DIAGNOSIS — E785 Hyperlipidemia, unspecified: Secondary | ICD-10-CM | POA: Diagnosis not present

## 2014-06-05 DIAGNOSIS — Z7952 Long term (current) use of systemic steroids: Secondary | ICD-10-CM | POA: Diagnosis not present

## 2014-06-05 DIAGNOSIS — E559 Vitamin D deficiency, unspecified: Secondary | ICD-10-CM | POA: Diagnosis not present

## 2014-06-05 DIAGNOSIS — R0602 Shortness of breath: Secondary | ICD-10-CM | POA: Diagnosis present

## 2014-06-05 DIAGNOSIS — F419 Anxiety disorder, unspecified: Secondary | ICD-10-CM | POA: Diagnosis not present

## 2014-06-05 DIAGNOSIS — Z87891 Personal history of nicotine dependence: Secondary | ICD-10-CM | POA: Insufficient documentation

## 2014-06-05 DIAGNOSIS — Z79899 Other long term (current) drug therapy: Secondary | ICD-10-CM | POA: Insufficient documentation

## 2014-06-05 DIAGNOSIS — M79609 Pain in unspecified limb: Secondary | ICD-10-CM

## 2014-06-05 DIAGNOSIS — Z853 Personal history of malignant neoplasm of breast: Secondary | ICD-10-CM | POA: Insufficient documentation

## 2014-06-05 DIAGNOSIS — Z7982 Long term (current) use of aspirin: Secondary | ICD-10-CM | POA: Insufficient documentation

## 2014-06-05 LAB — CBG MONITORING, ED: Glucose-Capillary: 105 mg/dL — ABNORMAL HIGH (ref 70–99)

## 2014-06-05 LAB — BASIC METABOLIC PANEL
Anion gap: 15 (ref 5–15)
BUN: 9 mg/dL (ref 6–23)
CO2: 22 mEq/L (ref 19–32)
Calcium: 9.8 mg/dL (ref 8.4–10.5)
Chloride: 104 mEq/L (ref 96–112)
Creatinine, Ser: 0.65 mg/dL (ref 0.50–1.10)
GFR calc Af Amer: >90 mL/min (ref >90)
GFR calc non Af Amer: >90 mL/min (ref >90)
Glucose, Bld: 108 mg/dL — ABNORMAL HIGH (ref 70–99)
Potassium: 3.7 mEq/L (ref 3.7–5.3)
Sodium: 141 mEq/L (ref 137–147)

## 2014-06-05 LAB — CBC WITH DIFFERENTIAL/PLATELET
Basophils Absolute: 0 10*3/uL (ref 0.0–0.1)
Basophils Relative: 0 % (ref 0–1)
Eosinophils Absolute: 0 10*3/uL (ref 0.0–0.7)
Eosinophils Relative: 1 % (ref 0–5)
HCT: 43.5 % (ref 36.0–46.0)
Hemoglobin: 14.5 g/dL (ref 12.0–15.0)
Lymphocytes Relative: 33 % (ref 12–46)
Lymphs Abs: 2.1 10*3/uL (ref 0.7–4.0)
MCH: 29.1 pg (ref 26.0–34.0)
MCHC: 33.3 g/dL (ref 30.0–36.0)
MCV: 87.3 fL (ref 78.0–100.0)
Monocytes Absolute: 0.6 10*3/uL (ref 0.1–1.0)
Monocytes Relative: 9 % (ref 3–12)
Neutro Abs: 3.6 10*3/uL (ref 1.7–7.7)
Neutrophils Relative %: 57 % (ref 43–77)
Platelets: 224 10*3/uL (ref 150–400)
RBC: 4.98 MIL/uL (ref 3.87–5.11)
RDW: 13.7 % (ref 11.5–15.5)
WBC: 6.4 10*3/uL (ref 4.0–10.5)

## 2014-06-05 LAB — I-STAT TROPONIN, ED: Troponin i, poc: 0 ng/mL (ref 0.00–0.08)

## 2014-06-05 LAB — D-DIMER, QUANTITATIVE (NOT AT ARMC): D-Dimer, Quant: 0.32 ug/mL-FEU (ref 0.00–0.48)

## 2014-06-05 MED ORDER — ACETAMINOPHEN 325 MG PO TABS
650.0000 mg | ORAL_TABLET | Freq: Once | ORAL | Status: DC
  Filled 2014-06-05: qty 2

## 2014-06-05 MED ORDER — LORAZEPAM 0.5 MG PO TABS: 0.5000 mg | ORAL_TABLET | Freq: Four times a day (QID) | ORAL | Status: DC | PRN

## 2014-06-05 MED ORDER — LORAZEPAM 2 MG/ML IJ SOLN
0.5000 mg | Freq: Once | INTRAMUSCULAR | Status: AC
  Administered 2014-06-05: 0.5 mg via INTRAVENOUS
  Filled 2014-06-05: qty 1

## 2014-06-05 MED ORDER — IPRATROPIUM-ALBUTEROL 0.5-2.5 (3) MG/3ML IN SOLN
3.0000 mL | Freq: Once | RESPIRATORY_TRACT | Status: AC
  Administered 2014-06-05: 3 mL via RESPIRATORY_TRACT
  Filled 2014-06-05: qty 3

## 2014-06-05 MED ORDER — METHYLPREDNISOLONE SODIUM SUCC 125 MG IJ SOLR
125.0000 mg | Freq: Once | INTRAMUSCULAR | Status: AC
  Administered 2014-06-05: 125 mg via INTRAVENOUS
  Filled 2014-06-05: qty 2

## 2014-06-05 NOTE — ED Notes (Signed)
EKG given to Dr Steinl °

## 2014-06-05 NOTE — Discharge Instructions (Signed)
Please follow with your primary care doctor in the next 2 days for a check-up. They must obtain records for further management.   Do not hesitate to return to the Emergency Department for any new, worsening or concerning symptoms.    Panic Attacks Panic attacks are sudden, short feelings of great fear or discomfort. You may have them for no reason when you are relaxed, when you are uneasy (anxious), or when you are sleeping.  HOME CARE  Take all your medicines as told.  Check with your doctor before starting new medicines.  Keep all doctor visits. GET HELP IF:  You are not able to take your medicines as told.  Your symptoms do not get better.  Your symptoms get worse. GET HELP RIGHT AWAY IF:  Your attacks seem different than your normal attacks.  You have thoughts about hurting yourself or others.  You take panic attack medicine and you have a side effect. MAKE SURE YOU:  Understand these instructions.  Will watch your condition.  Will get help right away if you are not doing well or get worse. Document Released: 09/21/2010 Document Revised: 06/09/2013 Document Reviewed: 04/02/2013 [facility] Patient Information 2015 [place], [place]. This information is not intended to replace advice given to you by your health care provider. Make sure you discuss any questions you have with your health care provider.

## 2014-06-05 NOTE — ED Provider Notes (Signed)
CSN: [id number]     Arrival date & time 06/05/14  1223 History   First MD Initiated Contact with Patient 06/05/14 1246     Chief Complaint  Patient presents with  . Shortness of Breath     (Consider location/radiation/quality/duration/timing/severity/associated sxs/prior Treatment) HPI  Regina Shaw is a 63 y.o. female with past medical history significant for lipidemia, vitamin D deficiency complaining of acute onset of shortness of breath this morning. Patient has a  lumbar radiculopathy, she's been taking hydrocodone, prednisone and gabapentin, she was switched to oxycodone yesterday. She took 5 pills yesterday. When she woke up this morning she took 2 pills approximately 9 AM and this is the start of her shortness of breath. Shortness of breath is made worse by lying supine. Denies increasing peripheral edema, orthopnea, DOE, wheezing, lip or tongue swelling, chest pain, hemoptysis, cough, fever, chills she does endorse a nausea on review of systems. Patient states that she has been relatively active through over the course of the last weeks. She does have calf pain but states it is unchanged from the onset of the radiculopathy 2 weeks ago. Denies history of exogenous estrogen, history of DVT PE.   Past Medical History  Diagnosis Date  . DCIS (ductal carcinoma in situ) of breast 04/29/2012  . Hyperlipidemia   . Vitamin D deficiency    Past Surgical History  Procedure Laterality Date  . Tubal ligation    . Breast lumpectomy  09/2009    [facility]  . Lumbar disc surgery      [facility]  . Tonsillectomy and adenoidectomy  1960   Family History  Problem Relation Age of Onset  . Liver disease Mother     d/c at 44, acute liver failure  . COPD Father     d/c at 60  . Heart attack Brother 20  . Stroke Brother 31    same brother  . Alcohol abuse Brother     other brother   History  Substance Use Topics  . Smoking status: Former Research scientist (life sciences)  . Smokeless tobacco: Not on file   . Alcohol Use: No   OB History   Grav Para Term Preterm Abortions TAB SAB Ect Mult Living   3 2 2  1     2      Review of Systems  10 systems reviewed and found to be negative, except as noted in the HPI.    Allergies  Red yeast rice and Darvon  Home Medications   Prior to Admission medications   Medication Sig Start Date End Date Taking? Authorizing Provider  aspirin EC 81 MG tablet Take 81 mg by mouth daily.   Yes Historical Provider, MD  cholecalciferol (VITAMIN D) 1000 UNITS tablet Take 4,000 Units by mouth daily after lunch.   Yes Historical Provider, MD  Multiple Vitamin (MULTIVITAMIN) tablet Take 1 tablet by mouth daily.   Yes Historical Provider, MD  oxyCODONE-acetaminophen (PERCOCET/ROXICET) 5-325 MG per tablet Take 1-2 tablets by mouth every 4 (four) hours as needed for severe pain.   Yes Historical Provider, MD  LORazepam (ATIVAN) 0.5 MG tablet Take 1 tablet (0.5 mg total) by mouth every 6 (six) hours as needed for anxiety. 06/05/14   Lawrence Mitch, PA-C   BP 116/77  Pulse 93  Temp(Src) 97.9 F (36.6 C) (Oral)  Resp 18  SpO2 97% Physical Exam  Constitutional: She appears well-developed and well-nourished.  HENT:  Head: Normocephalic and atraumatic.  Mouth/Throat: Oropharynx is clear and moist.  No lip or tongue swelling  Eyes: Conjunctivae are normal. Pupils are equal, round, and reactive to light.  Neck: Normal range of motion. Neck supple. No JVD present.  Cardiovascular: Normal rate, regular rhythm and intact distal pulses.   Pulmonary/Chest: Effort normal and breath sounds normal. No respiratory distress. She has no wheezes. She has no rales. She exhibits no tenderness.  Abdominal: Soft. Bowel sounds are normal. She exhibits no distension and no mass. There is no tenderness. There is no rebound and no guarding.  Musculoskeletal: She exhibits tenderness. She exhibits no edema.  No calf asymmetry. Positives varicosities bilaterally. No palpable cords. Homans  sign is positive on the right.     ED Course  Procedures (including critical care time) Labs Review Labs Reviewed  BASIC METABOLIC PANEL - Abnormal; Notable for the following:    Glucose, Bld 108 (*)    All other components within normal limits  CBG MONITORING, ED - Abnormal; Notable for the following:    Glucose-Capillary 105 (*)    All other components within normal limits  CBC WITH DIFFERENTIAL  D-DIMER, QUANTITATIVE  Randolm Idol, ED    Imaging Review Dg Chest 2 View  06/05/2014   CLINICAL DATA:  SHORTNESS OF BREATH Initial encounter.  EXAM: CHEST  2 VIEW  COMPARISON:  08/01/2008  FINDINGS: Mild hyperinflation. Lateral view degraded by patient arm position. Dense aortic atherosclerosis. Surgical clips about the medial right breast. Midline trachea. Borderline cardiomegaly. Mediastinal contours otherwise within normal limits. No pleural effusion or pneumothorax. Biapical pleural thickening. Lower lobe predominant interstitial thickening. Clear lungs.  IMPRESSION: No acute cardiopulmonary disease.  Hyperinflation consistent with COPD/chronic bronchitis.   Electronically Signed   By: Abigail Miyamoto M.D.   On: 06/05/2014 13:07     EKG Interpretation   Date/Time:  Sunday June 05 2014 12:59:45 EDT Ventricular Rate:  72 PR Interval:  123 QRS Duration: 79 QT Interval:  372 QTC Calculation: 407 R Axis:   64 Text Interpretation:  Sinus rhythm No significant change since last  tracing Confirmed by Ashok Cordia  MD, Lennette Bihari ([id number]) on 06/05/2014 2:17:14 PM      MDM   Final diagnoses:  SOB (shortness of breath)  Anxiety    Filed Vitals:   06/05/14 1230 06/05/14 1447 06/05/14 1622  BP: 131/89 135/74 116/77  Pulse: 100 82 93  Temp: 97.9 F (36.6 C)    TempSrc: Oral    Resp: 17 15 18   SpO2: 99% 100% 97%    Medications  methylPREDNISolone sodium succinate (SOLU-MEDROL) 125 mg/2 mL injection 125 mg (125 mg Intravenous Given 06/05/14 1447)  ipratropium-albuterol (DUONEB)  0.5-2.5 (3) MG/3ML nebulizer solution 3 mL (3 mLs Nebulization Given 06/05/14 1447)  LORazepam (ATIVAN) injection 0.5 mg (0.5 mg Intravenous Given 06/05/14 1550)    Regina Shaw is a 63 y.o. female presenting with acute onset of shortness of breath with no chest pain this morning. Patient has normal vital signs, saturating well on room air. Lung sounds are clear to auscultation with excellent air movement in all fields and no wheezing. Chest x-ray is consistent with COPD. EKG is nonischemic. CBC with no anemia. Troponin is negative d-dimer is negative. Will obtain vascular ultrasound to rule out DVT.   Verbal report a vascular study is negative. I think had an extensive discussion with the patient on her symptoms and I read it reassured her that there is no indication of any emergent life-threatening and I believe she is stable to go home. I think there may  be an element of anxiety the patient's shortness of breath and we have discussed this and she is agreed. I've given her a small dose of Ativan in the ED and she feels much improved. I will write her for a short course to go home. We've had an extensive discussion of return precautions and I've urged her to return to the ED for any new, worsening or concerning symptoms.  This is a shared visit with the attending physician who personally evaluated the patient and agrees with the care plan.   Evaluation does not show pathology that would require ongoing emergent intervention or inpatient treatment. Pt is hemodynamically stable and mentating appropriately. Discussed findings and plan with patient/guardian, who agrees with care plan. All questions answered. Return precautions discussed and outpatient follow up given.   New Prescriptions   LORAZEPAM (ATIVAN) 0.5 MG TABLET    Take 1 tablet (0.5 mg total) by mouth every 6 (six) hours as needed for anxiety.         Monico Blitz, PA-C 06/05/14 1635

## 2014-06-05 NOTE — Progress Notes (Signed)
VASCULAR LAB PRELIMINARY  PRELIMINARY  PRELIMINARY  PRELIMINARY  Bilateral lower extremity venous Dopplers completed.    Preliminary report:  There is no DVT or SVT noted in the bilateral lower extremities.   Simmie Camerer, RVT 06/05/2014, 3:29 PM

## 2014-06-05 NOTE — ED Notes (Signed)
Pt reports biggest complain is SOB after taking Oxycodone for back pain.  Sts she is due to have injection in back for pain control.   Lungs clear but pt is taking deep breaths and has to be standing or sitting up straight

## 2014-06-05 NOTE — ED Notes (Signed)
Pt reports hx of pinched nerve in right leg, is suppose to get injected this week. So pcp stopped all of her regular pain meds, prescribed her percocet. Pt took 1 tablet at 2030, woke up at 0230 with pain took 2 tablets. Now reports she has SOB, cannot catch her breath.  Able to speak in full sentences, 99% on room air. Pain 8/10.

## 2014-06-06 NOTE — ED Provider Notes (Signed)
Medical screening examination/treatment/procedure(s) were conducted as a shared visit with non-physician practitioner(s) and myself.  I personally evaluated the patient during the encounter.   EKG Interpretation   Date/Time:  Sunday June 05 2014 12:59:45 EDT Ventricular Rate:  72 PR Interval:  123 QRS Duration: 79 QT Interval:  372 QTC Calculation: 407 R Axis:   64 Text Interpretation:  Sinus rhythm No significant change since last  tracing Confirmed by Delois Tolbert  MD, Lennette Bihari ([id number]) on 06/05/2014 2:17:14 PM      Pt states felt sob shortly after taking oxycodone. Denies preceding sob, cough, fever, chest pain or other c/o. +anxiety/hyperventilation.  Chest cta. rrr 14. Pulse ox 100%.   Mirna Mires, MD 06/06/14 [phone]

## 2014-06-20 ENCOUNTER — Other Ambulatory Visit: Payer: Self-pay | Admitting: Internal Medicine

## 2014-06-20 MED ORDER — LORAZEPAM 0.5 MG PO TABS: ORAL_TABLET | ORAL | Status: DC

## 2014-06-21 ENCOUNTER — Other Ambulatory Visit: Payer: Self-pay | Admitting: Neurological Surgery

## 2014-06-21 DIAGNOSIS — M5416 Radiculopathy, lumbar region: Secondary | ICD-10-CM

## 2014-06-27 ENCOUNTER — Ambulatory Visit: Payer: Self-pay | Admitting: Internal Medicine

## 2014-06-27 ENCOUNTER — Ambulatory Visit (INDEPENDENT_AMBULATORY_CARE_PROVIDER_SITE_OTHER): Payer: Medicare Other | Admitting: Physician Assistant

## 2014-06-27 ENCOUNTER — Encounter: Payer: Self-pay | Admitting: Internal Medicine

## 2014-06-27 VITALS — BP 122/60 | HR 90 | Temp 98.6°F | Resp 16 | Ht 62.25 in | Wt 158.0 lb

## 2014-06-27 DIAGNOSIS — R1031 Right lower quadrant pain: Secondary | ICD-10-CM

## 2014-06-27 DIAGNOSIS — Z79899 Other long term (current) drug therapy: Secondary | ICD-10-CM

## 2014-06-27 DIAGNOSIS — M5489 Other dorsalgia: Secondary | ICD-10-CM

## 2014-06-27 DIAGNOSIS — R110 Nausea: Secondary | ICD-10-CM

## 2014-06-27 LAB — BASIC METABOLIC PANEL WITH GFR
BUN: 10 mg/dL (ref 6–23)
CO2: 24 mEq/L (ref 19–32)
Calcium: 9.3 mg/dL (ref 8.4–10.5)
Chloride: 106 mEq/L (ref 96–112)
Creat: 0.65 mg/dL (ref 0.50–1.10)
GFR, Est African American: >89 mL/min
GFR, Est Non African American: >89 mL/min
Glucose, Bld: 100 mg/dL — ABNORMAL HIGH (ref 70–99)
Potassium: 4 mEq/L (ref 3.5–5.3)
Sodium: 140 mEq/L (ref 135–145)

## 2014-06-27 LAB — CBC WITH DIFFERENTIAL/PLATELET
Basophils Absolute: 0 10*3/uL (ref 0.0–0.1)
Basophils Relative: 0 % (ref 0–1)
Eosinophils Absolute: 0.1 10*3/uL (ref 0.0–0.7)
Eosinophils Relative: 2 % (ref 0–5)
HCT: 40.9 % (ref 36.0–46.0)
Hemoglobin: 13.6 g/dL (ref 12.0–15.0)
Lymphocytes Relative: 43 % (ref 12–46)
Lymphs Abs: 2.3 10*3/uL (ref 0.7–4.0)
MCH: 29 pg (ref 26.0–34.0)
MCHC: 33.3 g/dL (ref 30.0–36.0)
MCV: 87.2 fL (ref 78.0–100.0)
Monocytes Absolute: 0.4 10*3/uL (ref 0.1–1.0)
Monocytes Relative: 7 % (ref 3–12)
Neutro Abs: 2.5 10*3/uL (ref 1.7–7.7)
Neutrophils Relative %: 48 % (ref 43–77)
Platelets: 192 10*3/uL (ref 150–400)
RBC: 4.69 MIL/uL (ref 3.87–5.11)
RDW: 14.4 % (ref 11.5–15.5)
WBC: 5.3 10*3/uL (ref 4.0–10.5)

## 2014-06-27 LAB — HEPATIC FUNCTION PANEL
ALT: 13 U/L (ref 0–35)
AST: 16 U/L (ref 0–37)
Albumin: 4.2 g/dL (ref 3.5–5.2)
Alkaline Phosphatase: 54 U/L (ref 39–117)
Bilirubin, Direct: 0.1 mg/dL (ref 0.0–0.3)
Indirect Bilirubin: 0.3 mg/dL (ref 0.2–1.2)
Total Bilirubin: 0.4 mg/dL (ref 0.2–1.2)
Total Protein: 6.6 g/dL (ref 6.0–8.3)

## 2014-06-27 MED ORDER — ONDANSETRON HCL 4 MG PO TABS: 4.0000 mg | ORAL_TABLET | Freq: Three times a day (TID) | ORAL | Status: DC | PRN

## 2014-06-27 MED ORDER — PREGABALIN 75 MG PO CAPS: 75.0000 mg | ORAL_CAPSULE | Freq: Three times a day (TID) | ORAL | Status: DC

## 2014-06-27 NOTE — Patient Instructions (Addendum)
  Please follow- up with Dr. Roselee Culver Thrusday and ask him to do physical therapy and maybe think about a pain clinic.  Please follow-up with Dr. Roselee Culver for all back pain medication and all controlled substances that have to do with your back pain.  - Take Lyrica as prescribed.   -Take Zofran as needed for nausea. - Will call you about lab results when received.   Back Pain, Adult Back pain is very common. The pain often gets better over time. The cause of back pain is usually not dangerous. Most people can learn to manage their back pain on their own.  HOME CARE   Stay active. Start with short walks on flat ground if you can. Try to walk farther each day.  Do not sit, drive, or stand in one place for more than 30 minutes. Do not stay in bed.  Do not avoid exercise or work. Activity can help your back heal faster.  Be careful when you bend or lift an object. Bend at your knees, keep the object close to you, and do not twist.  Sleep on a firm mattress. Lie on your side, and bend your knees. If you lie on your back, put a pillow under your knees.  Only take medicines as told by your doctor.  Put ice on the injured area.  Put ice in a plastic bag.  Place a towel between your skin and the bag.  Leave the ice on for 15-20 minutes, 03-04 times a day for the first 2 to 3 days. After that, you can switch between ice and heat packs.  Ask your doctor about back exercises or massage.  Avoid feeling anxious or stressed. Find good ways to deal with stress, such as exercise. GET HELP RIGHT AWAY IF:   Your pain does not go away with rest or medicine.  Your pain does not go away in 1 week.  You have new problems.  You do not feel well.  The pain spreads into your legs.  You cannot control when you poop (bowel movement) or pee (urinate).  Your arms or legs feel weak or lose feeling (numbness).  You feel sick to your stomach (nauseous) or throw up (vomit).  You have belly  (abdominal) pain.  You feel like you may pass out (faint). MAKE SURE YOU:   Understand these instructions.  Will watch your condition.  Will get help right away if you are not doing well or get worse. Document Released: 02/05/2008 Document Revised: 11/11/2011 Document Reviewed: 12/21/2013 [facility] Patient Information 2015 [place], [place]. This information is not intended to replace advice given to you by your health care provider. Make sure you discuss any questions you have with your health care provider.

## 2014-06-27 NOTE — Progress Notes (Signed)
63 y.o. female presents for back pain and nausea.  HPI Patient states the back pain started on April 28, 2014.  She states the pain is mostly located in her right hip and shoots down the back of her right leg and wraps around to front of lower leg and ends in her big toe.  She describes the pain as constant achy and throbbing and feels like something is pulling in her right hip.  She states her pain is a 8 out of 10 right now.    She reports that she has tried Gabapentin that was prescribed by Ortho and Oxycodone that was prescribed by Ortho.  She states Dr. Melford Aase prescribed her Hydrocodone in the past before the Ortho doctor prescribed Oxycodone.  She states her symptoms are aggravated by standing and sitting for long periods of time.    Complains of = [x]  Denies = [ ]  SXS: Numbness [ x] Tingling [x ] Decreased ROM [ ]  Muscle Weakness [x ] Urinary Incontinence [ ]  Bowel Incontinence [ ]    She states she saw the Ortho doctor October 7.  She states Dr. Nelva Bush did an injection in her right hip 2 weeks ago and that she never wants to have injections again. She reports that she is now seeing Dr. Roselee Culver- Saw him 2 weeks ago- 2nd MRI with contrast at [facility] (Saturday).  Next Appt: Thursday 06/30/14. 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  She denies lifting anything heavy.  Pt states she went to ED recently due to SOB and nausea.  She thinks the nausea is due to the pain medication.  She denies vomiting/diarrhea/constipation.  She states the ED gave her Ativan and that is helping with the SOB she was having previously.  Pt denies fever, chills, cough, sinus symptoms and SOB.    Current Medications: Current Outpatient Prescriptions on File Prior to Visit  Medication Sig Dispense Refill  . aspirin EC 81 MG tablet Take 81 mg by mouth daily.      . cholecalciferol (VITAMIN D) 1000 UNITS tablet Take 4,000 Units by mouth  daily after lunch.      Marland Kitchen LORazepam (ATIVAN) 0.5 MG tablet Take 1/2 to 1 tablet as  Needed for anxiety  60 tablet  0  . Multiple Vitamin (MULTIVITAMIN) tablet Take 1 tablet by mouth daily.      Marland Kitchen oxyCODONE-acetaminophen (PERCOCET/ROXICET) 5-325 MG per tablet Take 1-2 tablets by mouth every 4 (four) hours as needed for severe pain.       No current facility-administered medications on file prior to visit.   Allergies:  Allergies  Allergen Reactions  . Red Yeast Rice [Cholestin]     Dizzy  . Darvon [Propoxyphene] Nausea And Vomiting and Anxiety   Medical History:  Past Medical History  Diagnosis Date  . DCIS (ductal carcinoma in situ) of breast 04/29/2012  . Hyperlipidemia   . Vitamin D deficiency    Surgical History:  Past Surgical History  Procedure Laterality Date  . Tubal ligation    . Breast lumpectomy  09/2009    [facility]  . Lumbar disc surgery      [facility]  . Tonsillectomy and adenoidectomy  1960   Social History:   History  Substance Use Topics  . Smoking status: Former Smoker    Quit date: 09/02/2005  . Smokeless tobacco: Not on file  . Alcohol Use: No  Physical Exam: Estimated body mass index is 28.67 kg/(m^2) as calculated from the following:   Height as of this encounter: 5' 2.25" (1.581 m).   Weight as of this encounter: 158 lb (71.668 kg). BP 122/60  Pulse 90  Temp(Src) 98.6 F (37 C) (Temporal)  Resp 16  Ht 5' 2.25" (1.581 m)  Wt 158 lb (71.668 kg)  BMI 28.67 kg/m2 General Appearance: Well nourished, in no apparent distress and had pleasant demeanor.  Physical Exam: Estimated body mass index is 28.67 kg/(m^2) as calculated from the following:   Height as of this encounter: 5' 2.25" (1.581 m).   Weight as of this encounter: 158 lb (71.668 kg). BP 122/60  Pulse 90  Temp(Src) 98.6 F (37 C) (Temporal)  Resp 16  Ht 5' 2.25" (1.581 m)  Wt 158 lb (71.668 kg)  BMI 28.67 kg/m2 General Appearance: Well nourished, in no apparent  distress and had pleasant demeanor. Head: Normocephalic. Respiratory: Chest wall expansion symmetrical.  CTAB,  r/r/w.  No stridor.  No increased effort of breathing. Cardio: RRR.   m/r/g.  S1S2nl.  PMI non-displaced.  Abdomen: Symmetrical, soft and rounded.  +BS nl x4.    No bruits heard in aorta and femoral arteries.  No masses, hernias or pulsatile masses.    Positive tenderness upon palpation in RLQ only.   Rebound.  Guarding.  Negative Psoas, Negative Obturator. Extremities:  C/C/E in upper and lower extremities.    Pulses B/L +2 in radial, dorsalis pedis and posterior tibial.     RLE and LLE equal in size, shape, color, temp, texture and some varicose veins.  Musculoskeletal: Patient is able to ambulate well.    Gait is  antalgic.  Straight leg raising with dorsiflexion positive on right for radicular symptoms and negative on left.  Sensory exam in the arms and legs are Normal except decreased sensation over L5- S1 dermatome in right leg..   Strength is normal and symmetric in arms and legs.  There is not paraspinal muscle spasm.    There is not midline tenderness, but SI tenderness on the right.  Trendelenburg: positive Skin: Warm, dry without rashes or lesions.     Capillary refill equal bilaterally in hands and feet.   Neuro: Alert and oriented X4, cooperative.  Mood and affect appropriate to situation.   Psych: Insight and Judgment appropriate.   Assessment and Plan 1. Nausea without vomiting and  2. Right Lower Quadrant Abdominal Pain - CBC with Differential - Urinalysis, Routine w reflex microscopic - ondansetron (ZOFRAN) 4 MG tablet; Take 1 tablet (4 mg total) by mouth every 8 (eight) hours as needed for nausea or vomiting.  Dispense: 20 tablet; Refill: 1 - Urine culture - Will call you with results.  3. Encounter for long-term current use of medication - BASIC METABOLIC PANEL WITH GFR - Hepatic function panel -Will monitor and call you with results.  4.  Right-sided back pain, unspecified location - ?sciatica ?Disc Changes - Please follow up with Dr. Roselee Culver for all back pain issues and medication and MRI results that he ordered.  I do suggest you ask him about physical therapy or pain clinic. Take the Lyrica as prescribed. - pregabalin (LYRICA) 75 MG capsule; Take 1 capsule (75 mg total) by mouth 3 (three) times daily.  Dispense: 45 capsule; Refill: 0  Asier Desroches, Stephani Police, PA-C 3:18 PM [facility]

## 2014-06-28 LAB — URINALYSIS, ROUTINE W REFLEX MICROSCOPIC
Bilirubin Urine: NEGATIVE
Glucose, UA: NEGATIVE mg/dL
Hgb urine dipstick: NEGATIVE
Ketones, ur: NEGATIVE mg/dL
Leukocytes, UA: NEGATIVE
Nitrite: NEGATIVE
Protein, ur: NEGATIVE mg/dL
Specific Gravity, Urine: 1.021 (ref 1.005–1.030)
Urobilinogen, UA: 0.2 mg/dL (ref 0.0–1.0)
pH: 6 (ref 5.0–8.0)

## 2014-06-29 ENCOUNTER — Other Ambulatory Visit: Payer: Self-pay | Admitting: Neurological Surgery

## 2014-06-29 LAB — URINE CULTURE
Colony Count: NO GROWTH
Organism ID, Bacteria: NO GROWTH

## 2014-06-30 ENCOUNTER — Other Ambulatory Visit: Payer: Medicare Other

## 2014-07-12 ENCOUNTER — Encounter (HOSPITAL_COMMUNITY)
Admission: RE | Admit: 2014-07-12 | Discharge: 2014-07-12 | Disposition: A | Discharge status: Home / Self Care | Payer: Medicare Other | Source: Ambulatory Visit | Attending: Neurological Surgery | Admitting: Neurological Surgery

## 2014-07-12 ENCOUNTER — Encounter (HOSPITAL_COMMUNITY): Payer: Self-pay

## 2014-07-12 DIAGNOSIS — Z01812 Encounter for preprocedural laboratory examination: Secondary | ICD-10-CM | POA: Insufficient documentation

## 2014-07-12 DIAGNOSIS — M7138 Other bursal cyst, other site: Secondary | ICD-10-CM | POA: Diagnosis not present

## 2014-07-12 LAB — BASIC METABOLIC PANEL
Anion gap: 15 (ref 5–15)
BUN: 12 mg/dL (ref 6–23)
CO2: 23 mEq/L (ref 19–32)
Calcium: 9.5 mg/dL (ref 8.4–10.5)
Chloride: 104 mEq/L (ref 96–112)
Creatinine, Ser: 0.97 mg/dL (ref 0.50–1.10)
GFR calc Af Amer: 70 mL/min — ABNORMAL LOW (ref >90)
GFR calc non Af Amer: 60 mL/min — ABNORMAL LOW (ref >90)
Glucose, Bld: 111 mg/dL — ABNORMAL HIGH (ref 70–99)
Potassium: 4.1 mEq/L (ref 3.7–5.3)
Sodium: 142 mEq/L (ref 137–147)

## 2014-07-12 LAB — CBC
HCT: 41.5 % (ref 36.0–46.0)
Hemoglobin: 13.5 g/dL (ref 12.0–15.0)
MCH: 29.7 pg (ref 26.0–34.0)
MCHC: 32.5 g/dL (ref 30.0–36.0)
MCV: 91.2 fL (ref 78.0–100.0)
Platelets: 154 10*3/uL (ref 150–400)
RBC: 4.55 MIL/uL (ref 3.87–5.11)
RDW: 13.8 % (ref 11.5–15.5)
WBC: 6.1 10*3/uL (ref 4.0–10.5)

## 2014-07-12 LAB — SURGICAL PCR SCREEN
MRSA, PCR: NEGATIVE
Staphylococcus aureus: NEGATIVE

## 2014-07-14 ENCOUNTER — Encounter (HOSPITAL_COMMUNITY): Payer: Self-pay

## 2014-07-17 MED ORDER — CEFAZOLIN SODIUM-DEXTROSE 2-3 GM-% IV SOLR
2.0000 g | INTRAVENOUS | Status: AC
  Administered 2014-07-18: 2 g via INTRAVENOUS
  Filled 2014-07-17: qty 50

## 2014-07-18 ENCOUNTER — Inpatient Hospital Stay (HOSPITAL_COMMUNITY)
Admission: RE | Admit: 2014-07-18 | Discharge: 2014-07-19 | DRG: 517 | Disposition: A | Discharge status: Home / Self Care | Payer: Medicare Other | Source: Ambulatory Visit | Attending: Neurological Surgery | Admitting: Neurological Surgery

## 2014-07-18 ENCOUNTER — Ambulatory Visit (HOSPITAL_COMMUNITY): Payer: Medicare Other

## 2014-07-18 ENCOUNTER — Encounter (HOSPITAL_COMMUNITY)
Admission: RE | Disposition: A | Discharge status: Home / Self Care | Payer: Self-pay | Source: Ambulatory Visit | Attending: Neurological Surgery

## 2014-07-18 ENCOUNTER — Ambulatory Visit (HOSPITAL_COMMUNITY): Payer: Medicare Other | Admitting: Certified Registered"

## 2014-07-18 ENCOUNTER — Encounter (HOSPITAL_COMMUNITY): Payer: Self-pay | Admitting: *Deleted

## 2014-07-18 DIAGNOSIS — M4726 Other spondylosis with radiculopathy, lumbar region: Principal | ICD-10-CM | POA: Diagnosis present

## 2014-07-18 DIAGNOSIS — Z87891 Personal history of nicotine dependence: Secondary | ICD-10-CM

## 2014-07-18 DIAGNOSIS — E559 Vitamin D deficiency, unspecified: Secondary | ICD-10-CM | POA: Diagnosis present

## 2014-07-18 DIAGNOSIS — M7138 Other bursal cyst, other site: Secondary | ICD-10-CM | POA: Diagnosis present

## 2014-07-18 DIAGNOSIS — M7130 Other bursal cyst, unspecified site: Secondary | ICD-10-CM

## 2014-07-18 DIAGNOSIS — E785 Hyperlipidemia, unspecified: Secondary | ICD-10-CM | POA: Diagnosis present

## 2014-07-18 HISTORY — PX: LAMINECTOMY: SHX219

## 2014-07-18 SURGERY — LUMBAR LAMINECTOMY FOR TUMOR
Anesthesia: General | Laterality: Right

## 2014-07-18 MED ORDER — PHENYLEPHRINE HCL 10 MG/ML IJ SOLN
INTRAMUSCULAR | Status: DC | PRN
  Administered 2014-07-18 (×3): 80 ug via INTRAVENOUS

## 2014-07-18 MED ORDER — DOCUSATE SODIUM 100 MG PO CAPS
100.0000 mg | ORAL_CAPSULE | Freq: Two times a day (BID) | ORAL | Status: DC
  Administered 2014-07-18: 100 mg via ORAL
  Filled 2014-07-18 (×3): qty 1

## 2014-07-18 MED ORDER — PHENYLEPHRINE HCL 10 MG/ML IJ SOLN
10.0000 mg | INTRAVENOUS | Status: DC | PRN
  Administered 2014-07-18: 30 ug/min via INTRAVENOUS

## 2014-07-18 MED ORDER — OXYCODONE HCL 5 MG PO TABS: 5.0000 mg | ORAL_TABLET | Freq: Once | ORAL | Status: DC | PRN

## 2014-07-18 MED ORDER — LIDOCAINE HCL (CARDIAC) 20 MG/ML IV SOLN
INTRAVENOUS | Status: AC
  Filled 2014-07-18: qty 5

## 2014-07-18 MED ORDER — BISACODYL 10 MG RE SUPP: 10.0000 mg | Freq: Every day | RECTAL | Status: DC | PRN

## 2014-07-18 MED ORDER — PROPOFOL 10 MG/ML IV BOLUS
INTRAVENOUS | Status: AC
  Filled 2014-07-18: qty 20

## 2014-07-18 MED ORDER — METHOCARBAMOL 500 MG PO TABS: 500.0000 mg | ORAL_TABLET | Freq: Four times a day (QID) | ORAL | Status: DC | PRN

## 2014-07-18 MED ORDER — KETOROLAC TROMETHAMINE 15 MG/ML IJ SOLN
INTRAMUSCULAR | Status: AC
  Filled 2014-07-18: qty 1

## 2014-07-18 MED ORDER — LIDOCAINE HCL (CARDIAC) 20 MG/ML IV SOLN
INTRAVENOUS | Status: DC | PRN
  Administered 2014-07-18: 20 mg via INTRAVENOUS

## 2014-07-18 MED ORDER — HYDROMORPHONE HCL 1 MG/ML IJ SOLN
INTRAMUSCULAR | Status: AC
  Administered 2014-07-18: 0.5 mg via INTRAVENOUS
  Filled 2014-07-18: qty 1

## 2014-07-18 MED ORDER — MEPERIDINE HCL 25 MG/ML IJ SOLN: 6.2500 mg | INTRAMUSCULAR | Status: DC | PRN

## 2014-07-18 MED ORDER — ROCURONIUM BROMIDE 50 MG/5ML IV SOLN
INTRAVENOUS | Status: AC
  Filled 2014-07-18: qty 1

## 2014-07-18 MED ORDER — PROMETHAZINE HCL 25 MG/ML IJ SOLN: 6.2500 mg | INTRAMUSCULAR | Status: DC | PRN

## 2014-07-18 MED ORDER — LACTATED RINGERS IV SOLN
INTRAVENOUS | Status: DC | PRN
  Administered 2014-07-18: 11:00:00 via INTRAVENOUS

## 2014-07-18 MED ORDER — MIDAZOLAM HCL 2 MG/2ML IJ SOLN: 0.5000 mg | Freq: Once | INTRAMUSCULAR | Status: DC | PRN

## 2014-07-18 MED ORDER — ONDANSETRON HCL 4 MG/2ML IJ SOLN
INTRAMUSCULAR | Status: AC
  Filled 2014-07-18: qty 2

## 2014-07-18 MED ORDER — DIAZEPAM 5 MG PO TABS: 10.0000 mg | ORAL_TABLET | Freq: Once | ORAL | Status: DC

## 2014-07-18 MED ORDER — LACTATED RINGERS IV SOLN
INTRAVENOUS | Status: DC
  Administered 2014-07-18: 09:00:00 via INTRAVENOUS

## 2014-07-18 MED ORDER — OXYCODONE-ACETAMINOPHEN 5-325 MG PO TABS: 1.0000 | ORAL_TABLET | ORAL | Status: DC | PRN

## 2014-07-18 MED ORDER — OXYCODONE HCL 5 MG/5ML PO SOLN: 5.0000 mg | Freq: Once | ORAL | Status: DC | PRN

## 2014-07-18 MED ORDER — POLYETHYLENE GLYCOL 3350 17 G PO PACK
17.0000 g | PACK | Freq: Every day | ORAL | Status: DC | PRN
  Filled 2014-07-18: qty 1

## 2014-07-18 MED ORDER — ALUM & MAG HYDROXIDE-SIMETH 200-200-20 MG/5ML PO SUSP: 30.0000 mL | Freq: Four times a day (QID) | ORAL | Status: DC | PRN

## 2014-07-18 MED ORDER — DIAZEPAM 5 MG PO TABS: 5.0000 mg | ORAL_TABLET | Freq: Four times a day (QID) | ORAL | Status: DC | PRN

## 2014-07-18 MED ORDER — ARTIFICIAL TEARS OP OINT
TOPICAL_OINTMENT | OPHTHALMIC | Status: DC | PRN
  Administered 2014-07-18: 1 via OPHTHALMIC

## 2014-07-18 MED ORDER — GLYCOPYRROLATE 0.2 MG/ML IJ SOLN
INTRAMUSCULAR | Status: DC | PRN
  Administered 2014-07-18: .6 mg via INTRAVENOUS

## 2014-07-18 MED ORDER — PREGABALIN 50 MG PO CAPS: 75.0000 mg | ORAL_CAPSULE | Freq: Three times a day (TID) | ORAL | Status: DC

## 2014-07-18 MED ORDER — SODIUM CHLORIDE 0.9 % IV SOLN: 250.0000 mL | INTRAVENOUS | Status: DC

## 2014-07-18 MED ORDER — SODIUM CHLORIDE 0.9 % IJ SOLN
3.0000 mL | Freq: Two times a day (BID) | INTRAMUSCULAR | Status: DC
  Administered 2014-07-18: 3 mL via INTRAVENOUS

## 2014-07-18 MED ORDER — THROMBIN 20000 UNITS EX SOLR
CUTANEOUS | Status: DC | PRN
  Administered 2014-07-18: 20 mL via TOPICAL

## 2014-07-18 MED ORDER — DEXAMETHASONE SODIUM PHOSPHATE 4 MG/ML IJ SOLN
INTRAMUSCULAR | Status: DC | PRN
  Administered 2014-07-18: 10 mg via INTRAVENOUS

## 2014-07-18 MED ORDER — FLEET ENEMA 7-19 GM/118ML RE ENEM
1.0000 | ENEMA | Freq: Once | RECTAL | Status: AC | PRN
Start: 2014-07-18 — End: 2014-07-18
  Filled 2014-07-18: qty 1

## 2014-07-18 MED ORDER — ACETAMINOPHEN 650 MG RE SUPP: 650.0000 mg | RECTAL | Status: DC | PRN

## 2014-07-18 MED ORDER — FENTANYL CITRATE 0.05 MG/ML IJ SOLN
INTRAMUSCULAR | Status: AC
  Filled 2014-07-18: qty 5

## 2014-07-18 MED ORDER — SODIUM CHLORIDE 0.9 % IJ SOLN: 3.0000 mL | INTRAMUSCULAR | Status: DC | PRN

## 2014-07-18 MED ORDER — 0.9 % SODIUM CHLORIDE (POUR BTL) OPTIME
TOPICAL | Status: DC | PRN
  Administered 2014-07-18: 1000 mL

## 2014-07-18 MED ORDER — ROCURONIUM BROMIDE 100 MG/10ML IV SOLN
INTRAVENOUS | Status: DC | PRN
  Administered 2014-07-18: 40 mg via INTRAVENOUS

## 2014-07-18 MED ORDER — BUPIVACAINE HCL (PF) 0.25 % IJ SOLN
INTRAMUSCULAR | Status: DC | PRN
  Administered 2014-07-18: 10 mL
  Administered 2014-07-18: 5 mL

## 2014-07-18 MED ORDER — HYDROCODONE-ACETAMINOPHEN 5-325 MG PO TABS: 1.0000 | ORAL_TABLET | ORAL | Status: DC | PRN

## 2014-07-18 MED ORDER — ONDANSETRON HCL 4 MG PO TABS: 4.0000 mg | ORAL_TABLET | Freq: Three times a day (TID) | ORAL | Status: DC | PRN

## 2014-07-18 MED ORDER — FENTANYL CITRATE 0.05 MG/ML IJ SOLN
INTRAMUSCULAR | Status: DC | PRN
  Administered 2014-07-18: 200 ug via INTRAVENOUS
  Administered 2014-07-18: 50 ug via INTRAVENOUS

## 2014-07-18 MED ORDER — KETOROLAC TROMETHAMINE 15 MG/ML IJ SOLN
15.0000 mg | Freq: Four times a day (QID) | INTRAMUSCULAR | Status: DC
  Administered 2014-07-18 – 2014-07-19 (×4): 15 mg via INTRAVENOUS
  Filled 2014-07-18 (×7): qty 1

## 2014-07-18 MED ORDER — NEOSTIGMINE METHYLSULFATE 10 MG/10ML IV SOLN
INTRAVENOUS | Status: DC | PRN
  Administered 2014-07-18: 4 mg via INTRAVENOUS

## 2014-07-18 MED ORDER — SCOPOLAMINE 1 MG/3DAYS TD PT72
MEDICATED_PATCH | TRANSDERMAL | Status: AC
  Administered 2014-07-18: 1 via TRANSDERMAL
  Filled 2014-07-18: qty 1

## 2014-07-18 MED ORDER — MENTHOL 3 MG MT LOZG: 1.0000 | LOZENGE | OROMUCOSAL | Status: DC | PRN

## 2014-07-18 MED ORDER — MIDAZOLAM HCL 5 MG/5ML IJ SOLN
INTRAMUSCULAR | Status: DC | PRN
  Administered 2014-07-18: 2 mg via INTRAVENOUS

## 2014-07-18 MED ORDER — ONDANSETRON HCL 4 MG/2ML IJ SOLN: 4.0000 mg | INTRAMUSCULAR | Status: DC | PRN

## 2014-07-18 MED ORDER — ACETAMINOPHEN 325 MG PO TABS: 650.0000 mg | ORAL_TABLET | ORAL | Status: DC | PRN

## 2014-07-18 MED ORDER — LIDOCAINE-EPINEPHRINE 1 %-1:100000 IJ SOLN
INTRAMUSCULAR | Status: DC | PRN
  Administered 2014-07-18: 5 mL

## 2014-07-18 MED ORDER — MIDAZOLAM HCL 2 MG/2ML IJ SOLN
INTRAMUSCULAR | Status: AC
  Filled 2014-07-18: qty 2

## 2014-07-18 MED ORDER — MORPHINE SULFATE 2 MG/ML IJ SOLN: 1.0000 mg | INTRAMUSCULAR | Status: DC | PRN

## 2014-07-18 MED ORDER — GABAPENTIN 300 MG PO CAPS
300.0000 mg | ORAL_CAPSULE | Freq: Every day | ORAL | Status: DC
  Administered 2014-07-18: 300 mg via ORAL
  Filled 2014-07-18 (×2): qty 1

## 2014-07-18 MED ORDER — SENNA 8.6 MG PO TABS
1.0000 | ORAL_TABLET | Freq: Two times a day (BID) | ORAL | Status: DC
  Administered 2014-07-18: 8.6 mg via ORAL
  Filled 2014-07-18 (×3): qty 1

## 2014-07-18 MED ORDER — HYDROMORPHONE HCL 1 MG/ML IJ SOLN
0.2500 mg | INTRAMUSCULAR | Status: DC | PRN
  Administered 2014-07-18 (×2): 0.5 mg via INTRAVENOUS

## 2014-07-18 MED ORDER — DIPHENHYDRAMINE HCL 50 MG/ML IJ SOLN: 10.0000 mg | Freq: Once | INTRAMUSCULAR | Status: DC

## 2014-07-18 MED ORDER — PROPOFOL 10 MG/ML IV BOLUS
INTRAVENOUS | Status: DC | PRN
  Administered 2014-07-18: 150 mg via INTRAVENOUS

## 2014-07-18 MED ORDER — ONDANSETRON HCL 4 MG/2ML IJ SOLN
INTRAMUSCULAR | Status: DC | PRN
Start: 2014-07-18 — End: 2014-07-18
  Administered 2014-07-18: 4 mg via INTRAVENOUS

## 2014-07-18 MED ORDER — DEXTROSE 5 % IV SOLN
500.0000 mg | Freq: Four times a day (QID) | INTRAVENOUS | Status: DC | PRN
  Filled 2014-07-18: qty 5

## 2014-07-18 MED ORDER — PHENOL 1.4 % MT LIQD: 1.0000 | OROMUCOSAL | Status: DC | PRN

## 2014-07-18 MED ORDER — CEFAZOLIN SODIUM 1-5 GM-% IV SOLN
1.0000 g | Freq: Three times a day (TID) | INTRAVENOUS | Status: AC
  Administered 2014-07-18 – 2014-07-19 (×2): 1 g via INTRAVENOUS
  Filled 2014-07-18 (×3): qty 50

## 2014-07-18 SURGICAL SUPPLY — 76 items
APL SKNCLS STERI-STRIP NONHPOA (GAUZE/BANDAGES/DRESSINGS)
BAG DECANTER FOR FLEXI CONT (MISCELLANEOUS) ×2 IMPLANT
BENZOIN TINCTURE PRP APPL 2/3 (GAUZE/BANDAGES/DRESSINGS) IMPLANT
BLADE CLIPPER SURG (BLADE) ×2 IMPLANT
BLADE SURG 11 STRL SS (BLADE) IMPLANT
BUR MATCHSTICK NEURO 3.0 LAGG (BURR) ×2 IMPLANT
CANISTER SUCT 3000ML (MISCELLANEOUS) ×2 IMPLANT
CATH ROBINSON RED A/P 10FR (CATHETERS) IMPLANT
CLIP TI MEDIUM 6 (CLIP) IMPLANT
CONT SPEC 4OZ CLIKSEAL STRL BL (MISCELLANEOUS) ×2 IMPLANT
DECANTER SPIKE VIAL GLASS SM (MISCELLANEOUS) ×2 IMPLANT
DRAPE LAPAROTOMY 100X72 PEDS (DRAPES) IMPLANT
DRAPE LAPAROTOMY 100X72X124 (DRAPES) ×2 IMPLANT
DRAPE LONG LASER MIC (DRAPES) IMPLANT
DRAPE MICROSCOPE LEICA (MISCELLANEOUS) IMPLANT
DRAPE POUCH INSTRU U-SHP 10X18 (DRAPES) ×2 IMPLANT
DURAPREP 26ML APPLICATOR (WOUND CARE) ×2 IMPLANT
ELECT REM PT RETURN 9FT ADLT (ELECTROSURGICAL) ×2
ELECTRODE REM PT RTRN 9FT ADLT (ELECTROSURGICAL) ×1 IMPLANT
GAUZE SPONGE 4X4 12PLY STRL (GAUZE/BANDAGES/DRESSINGS) IMPLANT
GAUZE SPONGE 4X4 16PLY XRAY LF (GAUZE/BANDAGES/DRESSINGS) IMPLANT
GLOVE BIO SURGEON STRL SZ7.5 (GLOVE) IMPLANT
GLOVE BIO SURGEON STRL SZ8.5 (GLOVE) ×1 IMPLANT
GLOVE BIOGEL PI IND STRL 7.0 (GLOVE) IMPLANT
GLOVE BIOGEL PI IND STRL 7.5 (GLOVE) IMPLANT
GLOVE BIOGEL PI IND STRL 8.5 (GLOVE) ×1 IMPLANT
GLOVE BIOGEL PI INDICATOR 7.0 (GLOVE) ×2
GLOVE BIOGEL PI INDICATOR 7.5 (GLOVE)
GLOVE BIOGEL PI INDICATOR 8.5 (GLOVE) ×1
GLOVE ECLIPSE 8.5 STRL (GLOVE) ×2 IMPLANT
GLOVE EXAM NITRILE LRG STRL (GLOVE) IMPLANT
GLOVE EXAM NITRILE MD LF STRL (GLOVE) IMPLANT
GLOVE EXAM NITRILE XL STR (GLOVE) IMPLANT
GLOVE EXAM NITRILE XS STR PU (GLOVE) IMPLANT
GLOVE SS BIOGEL STRL SZ 8 (GLOVE) IMPLANT
GLOVE SUPERSENSE BIOGEL SZ 8 (GLOVE) ×1
GLOVE SURG SS PI 7.0 STRL IVOR (GLOVE) ×3 IMPLANT
GOWN STRL REUS W/ TWL LRG LVL3 (GOWN DISPOSABLE) IMPLANT
GOWN STRL REUS W/ TWL XL LVL3 (GOWN DISPOSABLE) ×1 IMPLANT
GOWN STRL REUS W/TWL 2XL LVL3 (GOWN DISPOSABLE) ×2 IMPLANT
GOWN STRL REUS W/TWL LRG LVL3 (GOWN DISPOSABLE)
GOWN STRL REUS W/TWL XL LVL3 (GOWN DISPOSABLE) ×6
HEMOSTAT SURGICEL 2X14 (HEMOSTASIS) ×1 IMPLANT
KIT BASIN OR (CUSTOM PROCEDURE TRAY) ×2 IMPLANT
KIT ROOM TURNOVER OR (KITS) ×2 IMPLANT
LIQUID BAND (GAUZE/BANDAGES/DRESSINGS) ×1 IMPLANT
NEEDLE HYPO 22GX1.5 SAFETY (NEEDLE) ×2 IMPLANT
NS IRRIG 1000ML POUR BTL (IV SOLUTION) ×2 IMPLANT
PACK LAMINECTOMY NEURO (CUSTOM PROCEDURE TRAY) ×1 IMPLANT
PAD ARMBOARD 7.5X6 YLW CONV (MISCELLANEOUS) ×6 IMPLANT
PATTIES SURGICAL .25X.25 (GAUZE/BANDAGES/DRESSINGS) IMPLANT
PATTIES SURGICAL .5 X1 (DISPOSABLE) IMPLANT
PATTIES SURGICAL .5 X3 (DISPOSABLE) IMPLANT
PATTIES SURGICAL 1/4 X 3 (GAUZE/BANDAGES/DRESSINGS) IMPLANT
RUBBERBAND STERILE (MISCELLANEOUS) IMPLANT
SPECIMEN JAR SMALL (MISCELLANEOUS) IMPLANT
SPONGE LAP 4X18 X RAY DECT (DISPOSABLE) IMPLANT
SPONGE SURGIFOAM ABS GEL 100 (HEMOSTASIS) ×2 IMPLANT
STAPLER SKIN PROX WIDE 3.9 (STAPLE) ×2 IMPLANT
STRIP CLOSURE SKIN 1/2X4 (GAUZE/BANDAGES/DRESSINGS) IMPLANT
SUT NURALON 4 0 TR CR/8 (SUTURE) IMPLANT
SUT PDS AB 1 CTX 36 (SUTURE) IMPLANT
SUT PROLENE 6 0 BV (SUTURE) IMPLANT
SUT SILK 3 0 TIES 17X18 (SUTURE)
SUT SILK 3-0 18XBRD TIE BLK (SUTURE) IMPLANT
SUT VIC AB 1 CT1 18XBRD ANBCTR (SUTURE) ×1 IMPLANT
SUT VIC AB 1 CT1 8-18 (SUTURE) ×2
SUT VIC AB 2-0 CP2 18 (SUTURE) ×2 IMPLANT
SUT VIC AB 3-0 SH 8-18 (SUTURE) ×1 IMPLANT
SYR 20ML ECCENTRIC (SYRINGE) ×2 IMPLANT
SYR CONTROL 10ML LL (SYRINGE) ×2 IMPLANT
TIP SONASTAR STD MISONIX 1.9 (TRAY / TRAY PROCEDURE) IMPLANT
TOWEL OR 17X24 6PK STRL BLUE (TOWEL DISPOSABLE) ×2 IMPLANT
TOWEL OR 17X26 10 PK STRL BLUE (TOWEL DISPOSABLE) ×2 IMPLANT
TRAY FOLEY CATH 14FRSI W/METER (CATHETERS) IMPLANT
WATER STERILE IRR 1000ML POUR (IV SOLUTION) ×2 IMPLANT

## 2014-07-18 NOTE — Op Note (Signed)
Date of surgery: 07/18/2014 Preoperative diagnosis: Lumbar spondylosis with right lumbar radiculopathy in L5 distribution secondary to synovial cyst Postoperative diagnosis: Lumbar spondylosis with right lumbar radiculopathy in L5 distribution secondary to synovial cyst Procedure: Laminotomy foraminotomy decompression of synovial cyst L4-5 right with operating microscope microdissection technique Surgeon: Kristeen Miss First assistant: Dr. Newman Pies M.D. Anesthesia: Gen. Endotracheal Indications: [name] is a 63 year old individual who has had significant back and right lower extremity pain with weakness in tibialis anterior on the right side. She has evidence of a synovial cyst on the right side causing compression of the exiting L5 nerve root. She's been advised regarding the need for surgical intervention to decompress this region. She is failed efforts at conservative management.  Procedure: The patient was brought to the operating room supine on a stretcher. After the smooth induction of general endotracheal anesthesia, she was turned prone. Back was prepped with alcohol DuraPrep and draped in sterile fashion. A vertical incision was created over the region of L4-L5 and the lumbar dorsal fascia was opened on the right side to expose the interlaminar space at L4-L5 on the right. The lamina of L4 was exposed at the medial wall the facet. After all the soft tissue was cleared and radiographic confirmation was obtained, a laminotomy was created removing the inferior margin lamina of L4 out to and including the mesial wall the facet. The inferior fifth of the facet joints was removed. They'll ligament was taken up and the interlaminar space. A 2 mm and then a 3 mm Kerrison punch was used to remove the inferior margin lamina of L4 with the Helwig and undercut the facet substantially. This yielded the mass which was in the a synovial cyst extending from the superior aspect of the facet joint at  L4-5 on that right side. This tissue was resected until all the cyst was removed. Dissection was then carried inferiorly around dural tube and the take off the L5 nerve root which is also found to be free and clear. The disc space was noted to have a slight bulge in this region but no tears were noted within the disc. Once this area was decompressed with a laminotomy and foraminotomies is felt that no further dissection should be continued. With hemostasis being achieved in this space the microscope was removed the retractor was removed lumbar dorsal fascia was closed with #1 Vicryl in interrupted fashion and 2-0 Vicryl was used in the subcutaneous cane is tissues and 3-0 Vicryl is used to close the subcutaneous take her skin. Patient tolerated the procedure well. Blood loss is estimated 25 mL.

## 2014-07-18 NOTE — Evaluation (Signed)
Occupational Therapy Evaluation Patient Details Name: Regina Shaw MRN: [id number] DOB: 06-07-1951 Today's Date: 07/18/2014    History of Present Illness 63 y.o. s/p Right Lumbar Four to Five Laminectomy for Synovial cyst.   Clinical Impression   Pt s/p above. Education provided to pt and daughter. Daughter will be able to assist pt at home as needed. OT signing off.    Follow Up Recommendations  No OT follow up;Supervision/Assistance - 24 hour    Equipment Recommendations  3 in 1 bedside comode    Recommendations for Other Services       Precautions / Restrictions Precautions Precautions: Back;Fall Precaution Booklet Issued: Yes (comment) Precaution Comments: educated on precautions Restrictions Weight Bearing Restrictions: No      Mobility Bed Mobility Overal bed mobility: Needs Assistance Bed Mobility: Rolling;Sidelying to Sit;Sit to Sidelying Rolling: Supervision Sidelying to sit: Supervision     Sit to sidelying: Supervision General bed mobility comments: cues for log roll technique.  Transfers Overall transfer level: Needs assistance   Transfers: Sit to/from Stand Sit to Stand: Min guard         General transfer comment: Min guard for safety. Pt nauseous/dizzy.    Balance                                            ADL Overall ADL's : Needs assistance/impaired     Grooming: Wash/dry hands;Set up;Supervision/safety;Standing               Lower Body Dressing: Sit to/from stand;Min guard   Toilet Transfer: Min guard;Ambulation;Regular Toilet   Toileting- Water quality scientist and Hygiene: Sit to/from stand;Min guard       Functional mobility during ADLs: Min guard General ADL Comments: Educated on LB dressing techniques. Pt able to cross legs over knees. Educated on use of cup for teeth care and placement of grooming items to avoid bending/twisting. Educated on incorporating back precautions in functional  activities. Discussed positioning of pillows. Educated that AE is available if LB ADLs become an issue and explained use of reacher to retrieve items. Educated on what pt could use for toilet hygiene and to wash buttocks as she says it was difficult to reach prior to surgery. Educated on tub transfer techniques. Educated on safety (rugs, safe shoewear).  explained what pt could use for shower chair.     Vision                     Perception     Praxis      Pertinent Vitals/Pain Pain Assessment: Faces Faces Pain Scale: Hurts a little bit Pain Location: incision Pain Descriptors / Indicators: Burning Pain Intervention(s): Monitored during session;Repositioned     Hand Dominance Right   Extremity/Trunk Assessment Upper Extremity Assessment Upper Extremity Assessment: Overall WFL for tasks assessed   Lower Extremity Assessment Lower Extremity Assessment: Defer to PT evaluation       Communication Communication Communication: No difficulties   Cognition Arousal/Alertness: Awake/alert Behavior During Therapy: WFL for tasks assessed/performed Overall Cognitive Status: Within Functional Limits for tasks assessed                     General Comments       Exercises       Shoulder Instructions      Home Living Family/patient expects to be discharged to:: Private residence Living  Arrangements: Alone Available Help at Discharge: Family;Available 24 hours/day (daughter staying next three weeks) Type of Home: Other(Comment) (townhome)       Home Layout: Two level Alternate Level Stairs-Number of Steps: flight Alternate Level Stairs-Rails: Right;Left Bathroom Shower/Tub: Teacher, early years/pre: Standard                Prior Functioning/Environment Level of Independence: Independent             OT Diagnosis: Acute pain   OT Problem List:     OT Treatment/Interventions:      OT Goals(Current goals can be found in the care plan  section)    OT Frequency:     Barriers to D/C:            Co-evaluation              End of Session Equipment Utilized During Treatment: Gait belt Nurse Communication: Mobility status  Activity Tolerance: Other (comment) (nausea/dizzy) Patient left: in bed;with call bell/phone within reach;with family/visitor present   Time: 1650-1714 OT Time Calculation (min): 24 min Charges:  OT General Charges $OT Visit: 1 Procedure OT Evaluation $Initial OT Evaluation Tier I: 1 Procedure OT Treatments $Self Care/Home Management : 8-22 mins G-Codes: OT G-codes **NOT FOR INPATIENT CLASS** Functional Assessment Tool Used: clinical judgment Functional Limitation: Self care Self Care Current Status (P0051): At least 1 percent but less than 20 percent impaired, limited or restricted Self Care Goal Status (T0211): At least 1 percent but less than 20 percent impaired, limited or restricted Self Care Discharge Status [phone]): At least 1 percent but less than 20 percent impaired, limited or restricted  Benito Mccreedy OTR/L [phone] 07/18/2014, 5:54 PM

## 2014-07-18 NOTE — Plan of Care (Signed)
Problem: Consults Goal: Spinal Surgery Patient Education See Patient Education Module for education specifics. Outcome: Completed/Met Date Met:  07/18/14 Goal: Diagnosis - Spinal Surgery Outcome: Completed/Met Date Met:  07/18/14 L4-5 Lami for Synovial Cyst

## 2014-07-18 NOTE — Transfer of Care (Signed)
Immediate Anesthesia Transfer of Care Note  Patient: Regina Shaw  Procedure(s) Performed: Procedure(s) with comments: Right Lumbar Four to Five Laminectomy for Synovial cyst (Right) - Right L4-5 Laminectomy for Synovial cyst  Patient Location: PACU  Anesthesia Type:General  Level of Consciousness: awake, alert  and oriented  Airway & Oxygen Therapy: Patient Spontanous Breathing  Post-op Assessment: Report given to PACU RN  Post vital signs: Reviewed and stable  Complications: No apparent anesthesia complications

## 2014-07-18 NOTE — Anesthesia Preprocedure Evaluation (Addendum)
Anesthesia Evaluation    Airway Mallampati: II  TM Distance: >3 FB Neck ROM: Full    Dental  (+) Chipped, Caps, Dental Advisory Given   Pulmonary former smoker (quit '07),  breath sounds clear to auscultation        Cardiovascular hypertension (borderline, no meds), - anginaRhythm:Regular Rate:Normal     Neuro/Psych Chronic back pain: narcotics    GI/Hepatic negative GI ROS, Neg liver ROS,   Endo/Other  negative endocrine ROS  Renal/GU negative Renal ROS     Musculoskeletal   Abdominal   Peds  Hematology   Anesthesia Other Findings Breast cancer  Reproductive/Obstetrics                            Anesthesia Physical Anesthesia Plan  ASA: II  Anesthesia Plan: General   Post-op Pain Management:    Induction: Intravenous  Airway Management Planned: Oral ETT  Additional Equipment:   Intra-op Plan:   Post-operative Plan: Extubation in OR  Informed Consent: I have reviewed the patients History and Physical, chart, labs and discussed the procedure including the risks, benefits and alternatives for the proposed anesthesia with the patient or authorized representative who has indicated his/her understanding and acceptance.   Dental advisory given  Plan Discussed with: CRNA and Surgeon  Anesthesia Plan Comments: (Plan routine monitors, GETA)        Anesthesia Quick Evaluation

## 2014-07-18 NOTE — Anesthesia Postprocedure Evaluation (Signed)
  Anesthesia Post-op Note  Patient: Regina Shaw  Procedure(s) Performed: Procedure(s) with comments: Right Lumbar Four to Five Laminectomy for Synovial cyst (Right) - Right L4-5 Laminectomy for Synovial cyst  Patient Location: PACU  Anesthesia Type:General  Level of Consciousness: awake, alert , oriented and patient cooperative  Airway and Oxygen Therapy: Patient Spontanous Breathing and Patient connected to nasal cannula oxygen  Post-op Pain: mild  Post-op Assessment: Post-op Vital signs reviewed, Patient's Cardiovascular Status Stable, Respiratory Function Stable, Patent Airway, No signs of Nausea or vomiting and Pain level controlled  Post-op Vital Signs: Reviewed and stable  Last Vitals:  Filed Vitals:   07/18/14 1516  BP: 144/57  Pulse: 51  Temp: 36.7 C  Resp: 16    Complications: No apparent anesthesia complications

## 2014-07-18 NOTE — H&P (Signed)
Regina Shaw is an 63 y.o. female.   Chief Complaint: back and right leg pain HPI: Patient is a 63 yo individua lwith back and right leg pain for months. She has failed conservative treatment. There is a synovial cyst at L4-5 on the right. She has been advised of the need for surgery.   Past Medical History  Diagnosis Date  . DCIS (ductal carcinoma in situ) of breast 04/29/2012  . Hyperlipidemia   . Vitamin D deficiency   . Cancer     Past Surgical History  Procedure Laterality Date  . Tubal ligation    . Breast lumpectomy  09/2009    [facility]  . Lumbar disc surgery      [facility]  . Tonsillectomy and adenoidectomy  1960  . Cesarean section      Family History  Problem Relation Age of Onset  . Liver disease Mother     d/c at 41, acute liver failure  . COPD Father     d/c at 17  . Heart attack Brother 80  . Stroke Brother 32    same brother  . Alcohol abuse Brother     other brother   Social History:  reports that she quit smoking about 8 years ago. She does not have any smokeless tobacco history on file. She reports that she does not drink alcohol or use illicit drugs.  Allergies:  Allergies  Allergen Reactions  . Red Yeast Rice [Cholestin]     Dizzy  . Tape Other (See Comments)    Pt states that skin burns, and it hurts severely.  . Darvon [Propoxyphene] Nausea And Vomiting and Anxiety    Medications Prior to Admission  Medication Sig Dispense Refill  . cholecalciferol (VITAMIN D) 1000 UNITS tablet Take 4,000 Units by mouth daily after lunch.    . diazepam (VALIUM) 10 MG tablet Take 10 mg by mouth once.    . gabapentin (NEURONTIN) 300 MG capsule Take 300 mg by mouth at bedtime.    Marland Kitchen LORazepam (ATIVAN) 0.5 MG tablet Take 1/2 to 1 tablet as  Needed for anxiety (Patient taking differently: Take 0.5 mg by mouth at bedtime. Take 1/2 to 1 tablet as  Needed for anxiety) 60 tablet 0  . Multiple Vitamin (MULTIVITAMIN) tablet Take 1 tablet by mouth daily.    Marland Kitchen  oxyCODONE-acetaminophen (PERCOCET/ROXICET) 5-325 MG per tablet Take 1 tablet by mouth every 4 (four) hours as needed for severe pain.     Marland Kitchen ondansetron (ZOFRAN) 4 MG tablet Take 1 tablet (4 mg total) by mouth every 8 (eight) hours as needed for nausea or vomiting. (Patient not taking: Reported on 07/11/2014) 20 tablet 1  . pregabalin (LYRICA) 75 MG capsule Take 1 capsule (75 mg total) by mouth 3 (three) times daily. (Patient not taking: Reported on 07/11/2014) 45 capsule 0    No results found for this or any previous visit (from the past 48 hour(s)). No results found.  Review of Systems  HENT: Negative.   Eyes: Negative.   Respiratory: Negative.   Cardiovascular: Negative.   Gastrointestinal: Negative.   Genitourinary: Negative.   Musculoskeletal: Positive for back pain.  Skin: Negative.   Neurological: Positive for focal weakness and weakness.  Endo/Heme/Allergies: Negative.   Psychiatric/Behavioral: Negative.     Blood pressure 139/70, pulse 73, temperature 98.1 F (36.7 C), temperature source Oral, resp. rate 18, height 5\' 2"  (1.575 m), weight 69.854 kg (154 lb), SpO2 95 %. Physical Exam  Constitutional: She appears  well-developed and well-nourished.  HENT:  Head: Normocephalic and atraumatic.  Eyes: Conjunctivae and EOM are normal. Pupils are equal, round, and reactive to light.  Neck: Normal range of motion. Neck supple.  Cardiovascular: Normal rate and regular rhythm.   Respiratory: Effort normal and breath sounds normal.  GI: Soft. Bowel sounds are normal.  Musculoskeletal:  Centralized back pain  Neurological:  Weakness in right tibialis anterior to 4/5     Assessment/Plan L4-5 Synovial cyst with right L 5 radiculopathy.   Laminotomy and foraminotomy L4-5 on the right.  Analaya Hoey J 07/18/2014, 12:28 PM

## 2014-07-18 NOTE — Discharge Summary (Signed)
Physician Discharge Summary  Patient ID: Regina Shaw MRN: [id number] DOB/AGE: April 25, 1951 63 y.o.  Admit date: 07/18/2014 Discharge date: 07/18/2014  Admission Diagnoses:lumbar spondylosis with radiculopathy L4-5 right secondary to synovial cyst  Discharge Diagnoses:lumbar spondylosis with radiculopathy L4-5 right secondary to synovial cyst  Active Problems:   Lumbar radiculopathy   Discharged Condition: good  Hospital Course: patient underwent surgical decompression of L4-5 right and tolerated the surgery well  Consults: None  Significant Diagnostic Studies: none  Treatments: surgery: laminotomy and foraminotomies with operating microscope L4-5 right decompression of right L5 nerve root  Discharge Exam: Blood pressure 144/57, pulse 51, temperature 98 F (36.7 C), temperature source Oral, resp. rate 16, height 5\' 2"  (1.575 m), weight 69.854 kg (154 lb), SpO2 95 %. incision is clean and dry motor function is intact station and gait are intact.  Disposition: 01-Home or Self Care  Discharge Instructions    Call MD for:  redness, tenderness, or signs of infection (pain, swelling, redness, odor or green/yellow discharge around incision site)    Complete by:  As directed      Call MD for:  severe uncontrolled pain    Complete by:  As directed      Call MD for:  temperature >100.4    Complete by:  As directed      Diet - low sodium heart healthy    Complete by:  As directed      Discharge instructions    Complete by:  As directed   Okay to shower. Do not apply salves or appointments to incision. No heavy lifting with the upper extremities greater than 15 pounds. May resume driving when not requiring pain medication and patient feels comfortable with doing so.     Increase activity slowly    Complete by:  As directed             Medication List    TAKE these medications        cholecalciferol 1000 UNITS tablet  Commonly known as:  VITAMIN D  Take 4,000 Units by  mouth daily after lunch.     diazepam 10 MG tablet  Commonly known as:  VALIUM  Take 10 mg by mouth once.     diazepam 5 MG tablet  Commonly known as:  VALIUM  Take 1 tablet (5 mg total) by mouth every 6 (six) hours as needed for muscle spasms.     gabapentin 300 MG capsule  Commonly known as:  NEURONTIN  Take 300 mg by mouth at bedtime.     LORazepam 0.5 MG tablet  Commonly known as:  ATIVAN  Take 1/2 to 1 tablet as  Needed for anxiety     multivitamin tablet  Take 1 tablet by mouth daily.     ondansetron 4 MG tablet  Commonly known as:  ZOFRAN  Take 1 tablet (4 mg total) by mouth every 8 (eight) hours as needed for nausea or vomiting.     oxyCODONE-acetaminophen 5-325 MG per tablet  Commonly known as:  PERCOCET/ROXICET  Take 1 tablet by mouth every 4 (four) hours as needed for severe pain.     pregabalin 75 MG capsule  Commonly known as:  LYRICA  Take 1 capsule (75 mg total) by mouth 3 (three) times daily.         SignedEarleen Newport 07/18/2014, 4:17 PM

## 2014-07-18 NOTE — Progress Notes (Signed)
Utilization review completed.  

## 2014-07-19 ENCOUNTER — Encounter (HOSPITAL_COMMUNITY): Payer: Self-pay | Admitting: Neurological Surgery

## 2014-07-19 NOTE — Evaluation (Signed)
Physical Therapy Evaluation Patient Details Name: Regina Shaw MRN: [id number] DOB: Apr 13, 1951 Today's Date: 07/19/2014   History of Present Illness  63 y.o. s/p Right Lumbar Four to Five Laminectomy for Synovial cyst.  Clinical Impression  Pt admitted for above procedure.  She is mod independent to supervision for all functional mobility.  Stairs assessed.  No f/u PT services or home equipment needed.  All education completed. PT signing off.      Follow Up Recommendations No PT follow up    Equipment Recommendations  None recommended by PT    Recommendations for Other Services       Precautions / Restrictions Precautions Precautions: Back Precaution Comments: Pt educated on 3/3 back precautions. Handout provided.      Mobility  Bed Mobility     Rolling: Modified independent (Device/Increase time) Sidelying to sit: Modified independent (Device/Increase time)     Sit to sidelying: Modified independent (Device/Increase time)    Transfers   Equipment used: None   Sit to Stand: Supervision            Ambulation/Gait Ambulation/Gait assistance: Supervision Ambulation Distance (Feet): 250 Feet Assistive device: None Gait Pattern/deviations: Step-through pattern;Decreased stride length Gait velocity: decreased      Stairs Stairs: Yes Stairs assistance: Min guard Stair Management: One rail Left;Step to pattern;Forwards Number of Stairs: 10    Wheelchair Mobility    Modified Rankin (Stroke Patients Only)       Balance Overall balance assessment: No apparent balance deficits (not formally assessed)                                           Pertinent Vitals/Pain Pain Assessment: 0-10 Pain Score: 2  Pain Location: incision Pain Descriptors / Indicators: Burning Pain Intervention(s): Monitored during session    Home Living Family/patient expects to be discharged to:: Private residence Living Arrangements: Alone Available  Help at Discharge: Family;Available 24 hours/day Type of Home: Other(Comment) (townhouse) Home Access: Level entry     Home Layout: Two level Home Equipment: None      Prior Function Level of Independence: Independent               Hand Dominance   Dominant Hand: Right    Extremity/Trunk Assessment   Upper Extremity Assessment: Defer to OT evaluation           Lower Extremity Assessment: Overall WFL for tasks assessed      Cervical / Trunk Assessment: Normal  Communication   Communication: No difficulties  Cognition Arousal/Alertness: Awake/alert Behavior During Therapy: WFL for tasks assessed/performed Overall Cognitive Status: Within Functional Limits for tasks assessed                      General Comments      Exercises        Assessment/Plan    PT Assessment Patent does not need any further PT services  PT Diagnosis Acute pain   PT Problem List    PT Treatment Interventions     PT Goals (Current goals can be found in the Care Plan section) Acute Rehab PT Goals Patient Stated Goal: home PT Goal Formulation: All assessment and education complete, DC therapy    Frequency     Barriers to discharge        Co-evaluation  End of Session   Activity Tolerance: Patient tolerated treatment well Patient left: in bed;with call bell/phone within reach;with family/visitor present Nurse Communication: Mobility status         Time: 5573-2202 PT Time Calculation (min) (ACUTE ONLY): 12 min   Charges:   PT Evaluation $Initial PT Evaluation Tier I: 1 Procedure PT Treatments $Gait Training: 8-22 mins   PT G Codes:          Lorriane Shire 07/19/2014, 9:26 AM

## 2014-07-19 NOTE — Progress Notes (Signed)
Pt and family given D/C instructions with Rx, verbal understanding was provided. Pt's incision is open to air and has no sign of infection. Pt's IV was removed prior to D/C. Pt D/C'd home via wheelchair @ [phone] per MD order. Pt is stable @ D/C and has no other needs at this time. Holli Humbles, RN

## 2014-07-19 NOTE — Discharge Instructions (Signed)
Wound Care °Leave incision open to air. °You may shower. °Do not scrub directly on incision.  °Do not put any creams, lotions, or ointments on incision. °Activity °Walk each and every day, increasing distance each day. °No lifting greater than 5 lbs.  Avoid excessive neck motion. °No driving for 2 weeks; may ride as a passenger locally. °Wear neck brace at all times except when showering.  If provided soft collar, may wear for comfort unless otherwise instructed. °Diet °Resume your normal diet.  °Return to Work °Will be discussed at you follow up appointment. °Call Your Doctor If Any of These Occur °Redness, drainage, or swelling at the wound.  °Temperature greater than 101 degrees. °Severe pain not relieved by pain medication. °Increased difficulty swallowing. °Incision starts to come apart. °Follow Up Appt °Call today for appointment in 4 weeks ([phone]) or for problems.  If you have any hardware placed in your spine, you will need an x-ray before your appointment. ° ° ° °Laminectomy - Laminotomy - Discectomy °Your surgeon has decided that a laminectomy (entire lamina removal) or laminotomy (partial lamina removal) is the best treatment for your back problem. These procedures involve removal of bone to relieve pressure on nerve roots. It allows the surgeon access to parts of the spine where other problems are located. This could be an injured disc (the cartilage-like structures located between the bones of the back). In this surgery your surgeon removes a part of the boney arch that surrounds your spinal canal. This may be compressing nerve roots. In some cases, the surgeon will remove the disc and fuse (stick together) vertebral bodies (the bones of your back) to make the spine more stable. The type of procedure you will need is usually decided prior to surgery, however modifications may be necessary. The time in surgery depends on the findings in surgery and the procedure necessary to correct the  problems. °DISCECTOMY °For people with disc problems, the surgeon removes the portion of the disc that is causing the pressure on the nerve root. Some surgeons perform a micro (small) discectomy, which may require removal of only a small portion of the lamina. A disc nucleus (center) may also be removed either through a needle (percutaneous discectomy) or by injecting an enzyme called chymopapain into the disc. Chymopapain is an enzyme that dissolves the disc. For people with back instability, the surgeon fuses vertebrae that are next to each other with tiny pieces of bone. These are used as bone grafts on the facets, or between the vertebrae. When this heals, the bones will no longer be able to move. These bone chips are often taken from the pelvic bones. Bones and bone grafts grow into one unit, stabilizing the segments of the spinal column. °LET YOUR CAREGIVER KNOW ABOUT: °· Allergies. °· Medicines taken including herbs, eyedrops, over-the-counter medicines, and creams. °· Use of steroids (by mouth or creams). °· Previous problems with anesthetics or numbing medicine. °· Possibility of pregnancy, if this applies. °· History of blood clots (thrombophlebitis). °· History of bleeding or blood problems. °· Previous surgery. °· Other health problems. °RISKS AND COMPLICATIONS °Your caregiver will discuss possible risks and complications with you before surgery. In addition to the usual risks of anesthesia, other common risks and complications include: °· Blood loss and replacement. °· Temporary increase in pain due to surgery. °· Uncorrected back pain. °· Infection. °· New nerve damage (tingling, numbness, and pain). °BEFORE THE PROCEDURE °· Stop smoking at least 1 week prior to surgery. This lowers   risk during surgery. °· Your caregiver may advise that you stop taking certain medicines that may affect the outcome of the surgery and your ability to heal. For example, you may need to stop taking anti-inflammatories,  such as aspirin, because of possible bleeding problems. Other medicines may have interactions with anesthesia. °· Tell your caregiver if you have been on steroids for long periods of time. Often, additional steroids are administered intravenously before and during the procedure to prevent complications. °· You should be present 60 minutes prior to your procedure or as directed. °AFTER THE PROCEDURE °After surgery, you will be taken to the recovery area where a nurse will watch and check your progress. Generally, you will be allowed to go home within 1 week barring other problems. °HOME CARE INSTRUCTIONS  °· Check the surgical cut (incision) twice a day for signs of infection. Some signs may include a bad smelling, greenish or yellowish discharge from the wound; increased pain or increased redness over the incision site; an opening of the incision; flu-like symptoms; or a temperature above 101.5° F (38.6° C). °· Change your bandages in about 24 to 36 hours following surgery or as directed. °· You may shower once the bandage is removed or as directed. Avoid bathtubs, swimming pools, and hot tubs for 3 weeks or until your incision has healed completely. If you have stitches (sutures) or staples they may be removed 2 to 3 weeks after surgery, or as directed by your caregiver. °· Follow your caregiver's instructions for activities, exercises, and physical therapy. °· Weight reduction may be beneficial if you are overweight. °· Walking is permitted. You may use a treadmill without an incline. Cut down on activities if you have discomfort. You may also go up and down stairs as tolerated. °· Do not lift anything heavier than 10 to 15 pounds. Avoid bending or twisting at the waist. Always bend your knees. °· Maintain strength and range of motion as instructed. °· No driving is permitted for 2 to 3 weeks, or as directed by your caregiver. You may be a passenger for 20 to 30 minute trips. Lying back in the passenger seat may  be more comfortable for you. °· Limit your sitting to 20 to 30 minute intervals. You should lie down or walk in between sitting periods. There are no limitations for sitting in a recliner chair. °· Only take over-the-counter or prescription medicines for pain, discomfort, or fever as directed by your caregiver. °SEEK MEDICAL CARE IF:  °· There is increased bleeding (more than a small spot) from the wound. °· You notice redness, swelling, or increasing pain in the wound. °· Pus is coming from the wound. °· An unexplained oral temperature above 102° F (38.9° C) develops. °· You notice a bad smell coming from the wound or dressing. °SEEK IMMEDIATE MEDICAL CARE IF:  °· You develop a rash. °· You have difficulty breathing. °· You have any allergic problems. °Document Released: 08/16/2000 Document Revised: 01/03/2014 Document Reviewed: 06/14/2008 °[employer]® Patient Information ©2015 [employer]. This information is not intended to replace advice given to you by your health care provider. Make sure you discuss any questions you have with your health care provider. ° °

## 2014-08-16 ENCOUNTER — Ambulatory Visit: Payer: Self-pay | Admitting: Internal Medicine

## 2014-09-07 ENCOUNTER — Encounter: Payer: Self-pay | Admitting: Internal Medicine

## 2014-09-07 ENCOUNTER — Telehealth: Payer: Self-pay | Admitting: Physician Assistant

## 2014-09-07 NOTE — Telephone Encounter (Signed)
Error, printed M[employer] and DEXA from [facility].

## 2014-09-09 ENCOUNTER — Telehealth: Payer: Self-pay | Admitting: Physician Assistant

## 2014-09-20 ENCOUNTER — Encounter: Payer: Self-pay | Admitting: Internal Medicine

## 2014-09-20 ENCOUNTER — Ambulatory Visit (INDEPENDENT_AMBULATORY_CARE_PROVIDER_SITE_OTHER): Payer: Medicare Other | Admitting: Internal Medicine

## 2014-09-20 VITALS — BP 108/66 | HR 76 | Temp 98.2°F | Resp 16 | Ht 62.25 in | Wt 157.4 lb

## 2014-09-20 DIAGNOSIS — M5442 Lumbago with sciatica, left side: Secondary | ICD-10-CM

## 2014-09-20 DIAGNOSIS — M5441 Lumbago with sciatica, right side: Secondary | ICD-10-CM

## 2014-09-20 MED ORDER — SERTRALINE HCL 50 MG PO TABS: ORAL_TABLET | ORAL | Status: DC

## 2014-09-20 NOTE — Progress Notes (Signed)
   Subjective:    Patient ID: Regina Shaw, female    DOB: July 28, 1951, 64 y.o.   MRN: [id number]  HPI  Pt presents today to discuss ongoing LBP related to back surgery by Dr Ellene Route on Nov 16,2015. Reports there has beena gradual impprovement to the point her pain is controlled with tylenol alone & she's had no sciatica or perceived weakness.    Also patient relates she's been somewhat depressed since he daughter moved to [place] she feels very lonely altho they talk on the phone every day. Relates her daughter has invited her to come live with her & husband, but patient is reluctant to move. She admits tearful episodes and denies any self destructive ideations. She does relate that she would like to restart taking Zoloft again.   Medication Sig  . VITAMIN D  Take 4,000 Units by mouth daily after lunch.  . MULTIVITAMIN tablet Take 1 tablet by mouth daily.   b ASA 81 mg   daily  . Motrin 200 mg  prn   Allergies  Allergen Reactions  . Red Yeast Rice [Cholestin]     Dizzy  . Tape Other (See Comments)    Pt states that skin burns, and it hurts severely.  . Darvon [Propoxyphene] Nausea And Vomiting and Anxiety   Past Medical History  Diagnosis Date  . DCIS (ductal carcinoma in situ) of breast 04/29/2012  . Hyperlipidemia   . Vitamin D deficiency   . Cancer    Past Surgical History  Procedure Laterality Date  . Tubal ligation    . Breast lumpectomy  09/2009    [facility]  . Lumbar disc surgery      [facility]  . Tonsillectomy and adenoidectomy  1960  . Cesarean section    . Laminectomy Right 07/18/2014    Procedure: Right Lumbar Four to Five Laminectomy for Synovial cyst;  Surgeon: Kristeen Miss, MD;  Location: [facility];  Service: Neurosurgery;  Laterality: Right;  Right L4-5 Laminectomy for Synovial cyst   Review of Systems Neg 10 pt systems review except as above.    Objective:   Physical Exam   BP 108/66 mmHg  Pulse 76  Temp(Src) 98.2 F (36.8 C)  Resp  16  Ht 5' 2.25" (1.581 m)  Wt 157 lb 6.4 oz (71.396 kg)  BMI 28.56 kg/m2   HEENT - Eac's patent. TM's Nl. EOM's full. PERRLA. NasoOroPharynx clear. Neck - supple. Nl Thyroid. Carotids 2+ & No bruits, nodes, JVD Chest - Clear equal BS w/o Rales, rhonchi, wheezes. Cor - Nl HS. RRR w/o sig MGR. PP 1(+). No edema. Abd - No palpable organomegaly, masses or tenderness. BS nl. MS- FROM w/o deformities. Muscle power, tone and bulk Nl. Gait Nl. Neuro - No obvious Cr N abnormalities. Sensory, motor and Cerebellar functions appear Nl w/o focal abnormalities. Psyche - Mental status Depressed affect.  No delusions, ideations or obvious mood abnormalities.    Assessment & Plan:   1. Bilateral low back pain with sciatica, sciatica laterality unspecified  2. Depression, reactive  - Rx - Sertraline 50 mg # 50 - start 1/2 tab x 8 days then increase to 1 whole tab.  - due back in ~ 1 month for CPE

## 2014-10-18 ENCOUNTER — Other Ambulatory Visit: Payer: Self-pay | Admitting: Internal Medicine

## 2014-10-18 ENCOUNTER — Encounter: Payer: Self-pay | Admitting: Internal Medicine

## 2014-10-18 ENCOUNTER — Ambulatory Visit (INDEPENDENT_AMBULATORY_CARE_PROVIDER_SITE_OTHER): Payer: Medicare Other | Admitting: Internal Medicine

## 2014-10-18 VITALS — BP 118/62 | HR 80 | Temp 98.1°F | Resp 16 | Ht 62.25 in | Wt 158.2 lb

## 2014-10-18 DIAGNOSIS — I10 Essential (primary) hypertension: Secondary | ICD-10-CM

## 2014-10-18 DIAGNOSIS — Z1331 Encounter for screening for depression: Secondary | ICD-10-CM

## 2014-10-18 DIAGNOSIS — Z9181 History of falling: Secondary | ICD-10-CM

## 2014-10-18 DIAGNOSIS — Z0000 Encounter for general adult medical examination without abnormal findings: Secondary | ICD-10-CM

## 2014-10-18 DIAGNOSIS — Z79899 Other long term (current) drug therapy: Secondary | ICD-10-CM

## 2014-10-18 DIAGNOSIS — R7303 Prediabetes: Secondary | ICD-10-CM

## 2014-10-18 DIAGNOSIS — F32A Depression, unspecified: Secondary | ICD-10-CM

## 2014-10-18 DIAGNOSIS — F329 Major depressive disorder, single episode, unspecified: Secondary | ICD-10-CM

## 2014-10-18 DIAGNOSIS — F324 Major depressive disorder, single episode, in partial remission: Secondary | ICD-10-CM | POA: Insufficient documentation

## 2014-10-18 DIAGNOSIS — D0510 Intraductal carcinoma in situ of unspecified breast: Secondary | ICD-10-CM

## 2014-10-18 DIAGNOSIS — Z1212 Encounter for screening for malignant neoplasm of rectum: Secondary | ICD-10-CM

## 2014-10-18 DIAGNOSIS — E785 Hyperlipidemia, unspecified: Secondary | ICD-10-CM

## 2014-10-18 DIAGNOSIS — Z23 Encounter for immunization: Secondary | ICD-10-CM

## 2014-10-18 DIAGNOSIS — E559 Vitamin D deficiency, unspecified: Secondary | ICD-10-CM

## 2014-10-18 DIAGNOSIS — F325 Major depressive disorder, single episode, in full remission: Secondary | ICD-10-CM | POA: Insufficient documentation

## 2014-10-18 DIAGNOSIS — R5383 Other fatigue: Secondary | ICD-10-CM

## 2014-10-18 LAB — HEMOGLOBIN A1C
Hgb A1c MFr Bld: 5.8 % — ABNORMAL HIGH (ref <5.7)
Mean Plasma Glucose: 120 mg/dL — ABNORMAL HIGH (ref <117)

## 2014-10-18 LAB — CBC WITH DIFFERENTIAL/PLATELET
Basophils Absolute: 0.1 10*3/uL (ref 0.0–0.1)
Basophils Relative: 1 % (ref 0–1)
Eosinophils Absolute: 0.2 10*3/uL (ref 0.0–0.7)
Eosinophils Relative: 3 % (ref 0–5)
HCT: 41.7 % (ref 36.0–46.0)
Hemoglobin: 13.8 g/dL (ref 12.0–15.0)
Lymphocytes Relative: 46 % (ref 12–46)
Lymphs Abs: 3 10*3/uL (ref 0.7–4.0)
MCH: 29.1 pg (ref 26.0–34.0)
MCHC: 33.1 g/dL (ref 30.0–36.0)
MCV: 88 fL (ref 78.0–100.0)
MPV: 11 fL (ref 8.6–12.4)
Monocytes Absolute: 0.5 10*3/uL (ref 0.1–1.0)
Monocytes Relative: 8 % (ref 3–12)
Neutro Abs: 2.7 10*3/uL (ref 1.7–7.7)
Neutrophils Relative %: 42 % — ABNORMAL LOW (ref 43–77)
Platelets: 200 10*3/uL (ref 150–400)
RBC: 4.74 MIL/uL (ref 3.87–5.11)
RDW: 13.4 % (ref 11.5–15.5)
WBC: 6.5 10*3/uL (ref 4.0–10.5)

## 2014-10-18 MED ORDER — PNEUMOCOCCAL VAC POLYVALENT 25 MCG/0.5ML IJ INJ: 0.5000 mL | INJECTION | Freq: Once | INTRAMUSCULAR | Status: DC

## 2014-10-18 MED ORDER — TETANUS-DIPHTH-ACELL PERTUSSIS 5-2.5-18.5 LF-MCG/0.5 IM SUSP: 0.5000 mL | Freq: Once | INTRAMUSCULAR | Status: DC

## 2014-10-18 NOTE — Progress Notes (Signed)
Patient ID: Regina Shaw, female   DOB: 1951/09/01, 64 y.o.   MRN: [id number]  [facility] VISIT AND CPE  Assessment:   1. Essential hypertension  - Microalbumin / creatinine urine ratio - EKG 12-Lead - [place], RETROPERITNL ABD,  LTD  2. Hyperlipidemia  - Lipid panel  3. Prediabetes  - Hemoglobin A1c - Insulin, fasting  4. Vitamin D deficiency  - Vit D  25 hydroxy (rtn osteoporosis monitoring)  5. DCIS (ductal carcinoma in situ) of breast, unspecified laterality   6. Screening for rectal cancer  - POC Hemoccult Bld/Stl (3-Cd Home Screen); Future  7. Depression, controlled   8. Depression screen  - Negative Screen   9. At low risk for fall   10. Other fatigue  - Iron and TIBC - TSH  11. Medication management  - Urine Microscopic - CBC with Differential/Platelet - BASIC METABOLIC PANEL WITH GFR - Hepatic function panel - Magnesium  12. Routine general medical examination at a health care facility   13. Need for prophylactic vaccination against Streptococcus pneumoniae (pneumococcus)  - Pneumococcal polysaccharide vaccine 23-valent greater than or equal to 2yo subcutaneous/IM   14. Need for prophylactic vaccination with combined diphtheria-tetanus-pertussis (DTP) vaccine  - Tdap vaccine greater than or equal to 7yo IM   Plan:   During the course of the visit the patient was educated and counseled about appropriate screening and preventive services including:    Pneumococcal vaccine   Influenza vaccine  Td vaccine  Screening electrocardiogram  Bone densitometry screening  Colorectal cancer screening  Diabetes screening  Glaucoma screening  Nutrition counseling   Advanced directives: requested  Screening recommendations, referrals: Vaccinations:  Immunization History  Administered Date(s) Administered  . Influenza, High Dose Seasonal PF 05/05/2014  . PPD Test 10/18/2013  . Pneumococcal Conjugate-13 05/05/2014   . Pneumococcal Polysaccharide-23 10/18/2014  . Tdap 10/18/2014  . Zoster 11/06/2009   Hep B vaccine not indicated  Nutrition assessed and recommended  Colonoscopy 2006 - Dr Fuller Plan recc 10 yr f/u (2016)  Recommended yearly ophthalmology/optometry visit for glaucoma screening and checkup Recommended yearly dental visit for hygiene and checkup Advanced directives - no - offered forms  Conditions/risks identified: BMI: Discussed weight loss, diet, and increase physical activity.  Increase physical activity: AHA recommends 150 minutes of physical activity a week.  Medications reviewed PreDiabetes is not at goal, ACE/ARB therapy: not indicated Urinary Incontinence is not an issue: discussed non pharmacology and pharmacology options.  Fall risk: low- discussed PT, home fall assessment, medications.   Subjective:   Regina Shaw is a 64 y.o. WWF who presents for Medicare Annual Wellness Visit and complete physical.  This is pt's 1st  medicare wellness visit.  She has had labile elevated blood pressure since 2004 and has been monitored expectantly. Her blood pressure has been controlled at home & today her  BP is BP: 118/62 mmHg She does not workout. She denies chest pain, shortness of breath, dizziness.  She is not on cholesterol medication. Her cholesterol is not at goal. The cholesterol last visit was:  Lab Results  Component Value Date   CHOL 222* 01/18/2014   HDL 66 01/18/2014   LDLCALC 116* 01/18/2014   LDLDIRECT 139.2 04/29/2012   TRIG 201* 01/18/2014   CHOLHDL 3.4 01/18/2014    She has had prediabetes for 5 years (2011 with A1c 6.3%). She has not been working on diet and exercise for prediabetes and denies diabetic polys, paresthesias or visual blurring. Last A1C in the  office was:  Lab Results  Component Value Date   HGBA1C 6.0* 01/18/2014   Patient is on Vitamin D supplement.   (Vit D 28 in 2008)  Lab Results  Component Value Date   VD25OH 79 01/18/2014      Names  of Other Physician/Practitioners you currently use: 1.  [facility] here for primary care 2. Dr at the [facility], eye doctor, last visit Dec 2015 3. Dr Mariea Clonts, [place], dentist, last visit annual next due in July 2016  Patient Care Team: Unk Pinto, MD as PCP - General (Internal Medicine) Ladene Artist, MD as Consulting Physician (Gastroenterology) Rozetta Nunnery, MD as Consulting Physician (Otolaryngology) Johnn Hai, MD as Consulting Physician (Orthopedic Surgery)  Medication Review: Current Outpatient Prescriptions on File Prior to Visit  Medication Sig Dispense Refill  . aspirin EC 81 MG tablet Take 81 mg by mouth daily.    . cholecalciferol (VITAMIN D) 1000 UNITS tablet Take 4,000 Units by mouth daily after lunch.    . Multiple Vitamin (MULTIVITAMIN) tablet Take 1 tablet by mouth daily.    . sertraline (ZOLOFT) 50 MG tablet Take 1 tablet daily for mood 90 tablet 99   No current facility-administered medications on file prior to visit.    Current Problems (verified) Patient Active Problem List   Diagnosis Date Noted  . Depression, controlled 10/18/2014  . Lumbar radiculopathy 07/18/2014  . Medication management 10/17/2013  . Essential hypertension 09/07/2013  . Prediabetes 09/07/2013  . Vitamin D deficiency 09/07/2013  . Hyperlipidemia   . DCIS (ductal carcinoma in situ) of breast 04/29/2012    Screening Tests Health Maintenance  Topic Date Due  . TETANUS/TDAP  02/26/1968  . MAMMOGRAM  08/02/2013  . PNEUMOCOCCAL POLYSACCHARIDE VACCINE AGE 67 AND OVER  02/25/2014  . INFLUENZA VACCINE  04/03/2015  . COLONOSCOPY  04/02/2016  . DEXA SCAN  Completed  . ZOSTAVAX  Completed    Immunization History  Administered Date(s) Administered  . Influenza, High Dose Seasonal PF 05/05/2014  . PPD Test 10/18/2013  . Pneumococcal Conjugate-13 05/05/2014  . Pneumococcal Polysaccharide-23 10/18/2014  . Tdap 10/18/2014  .  Zoster 11/06/2009    Preventative care: Last colonoscopy: 2006 and 10 yr f/u due this yr - Dr Fuller Plan  History reviewed: allergies, current medications, past family history, past medical history, past social history, past surgical history and problem list  Risk Factors: Tobacco History  Substance Use Topics  . Smoking status: Former Smoker    Quit date: 09/02/2005  . Smokeless tobacco: Not on file  . Alcohol Use: No   She does not smoke.  Patient is a former smoker. Are there smokers in your home (other than you)?  No Alcohol Current alcohol use: none  Caffeine Current caffeine use: caffeinated soft drinks 1 can /day  Exercise Current exercise: housecleaning, walking and yard work  Nutrition/Diet Current diet: in general, a "healthy" diet    Cardiac risk factors: advanced age (older than 79 for men, 82 for women), dyslipidemia, hypertension, sedentary lifestyle and smoking/ tobacco exposure.  Depression Screen (Note: if answer to either of the following is "Yes", a more complete depression screening is indicated)   Q1: Over the past two weeks, have you felt down, depressed or hopeless? No  Q2: Over the past two weeks, have you felt little interest or pleasure in doing things? No  Have you lost interest or pleasure in daily life? No  Do you often feel hopeless? No  Do  you cry easily over simple problems? No  Activities of Daily Living In your present state of health, do you have any difficulty performing the following activities?:  Driving? No Managing money?  No Feeding yourself? No Getting from bed to chair? No Climbing a flight of stairs? No Preparing food and eating?: No Bathing or showering? No Getting dressed: No Getting to the toilet? No Using the toilet:No Moving around from place to place: No In the past year have you fallen or had a near fall?:No   Are you sexually active?  No  Do you have more than one partner?  No  Vision  Difficulties: No  Hearing Difficulties: No Do you often ask people to speak up or repeat themselves? No Do you experience ringing or noises in your ears? No Do you have difficulty understanding soft or whispered voices? Sometimes.  Cognition  Do you feel that you have a problem with memory?No  Do you often misplace items? No  Do you feel safe at home?  Yes  Advanced directives Does patient have a [place]? No - offered forms Does patient have a Living Will? No - offered forms  Past Medical History  Diagnosis Date  . DCIS (ductal carcinoma in situ) of breast 04/29/2012  . Hyperlipidemia   . Vitamin D deficiency   . Cancer    Past Surgical History  Procedure Laterality Date  . Tubal ligation    . Breast lumpectomy  09/2009    [facility]  . Lumbar disc surgery      [facility]  . Tonsillectomy and adenoidectomy  1960  . Cesarean section    . Laminectomy Right 07/18/2014    Procedure: Right Lumbar Four to Five Laminectomy for Synovial cyst;  Surgeon: Kristeen Miss, MD;  Location: [place] [facility];  Service: Neurosurgery;  Laterality: Right;  Right L4-5 Laminectomy for Synovial cyst    ROS: Constitutional: Denies fever, chills, weight loss/gain, headaches, insomnia, fatigue, night sweats, and change in appetite. Eyes: Denies redness, blurred vision, diplopia, discharge, itchy, watery eyes.  ENT: Denies discharge, congestion, post nasal drip, epistaxis, sore throat, earache, hearing loss, dental pain, Tinnitus, Vertigo, Sinus pain, snoring.  Cardio: Denies chest pain, palpitations, irregular heartbeat, syncope, dyspnea, diaphoresis, orthopnea, PND, claudication, edema Respiratory: denies cough, dyspnea, DOE, pleurisy, hoarseness, laryngitis, wheezing.  Gastrointestinal: Denies dysphagia, heartburn, reflux, water brash, pain, cramps, nausea, vomiting, bloating, diarrhea, constipation, hematemesis, melena, hematochezia, jaundice, hemorrhoids Genitourinary:  Denies dysuria, frequency, urgency, nocturia, hesitancy, discharge, hematuria, flank pain Breast: Breast lumps, nipple discharge, bleeding.  Musculoskeletal: Denies arthralgia, myalgia, stiffness, Jt. Swelling, pain, limp, and strain/sprain. Denies falls. Skin: Denies puritis, rash, hives, warts, acne, eczema, changing in skin lesion Neuro: No weakness, tremor, incoordination, spasms, paresthesia, pain Psychiatric: Denies confusion, memory loss, sensory loss. Denies Depression. Endocrine: Denies change in weight, skin, hair change, nocturia, and paresthesia, diabetic polys, visual blurring, hyper / hypo glycemic episodes.  Heme/Lymph: No excessive bleeding, bruising, enlarged lymph nodes  Objective:     BP 118/62   Pulse 80  Temp 98.1 F   Resp 16  Ht 5' 2.25"   Wt 158 lb 3.2 oz     BMI 28.71   General Appearance: Well nourished, alert, WD/WN, femaleand in no apparent distress. Eyes: PERRLA, EOMs, conjunctiva no swelling or erythema, normal fundi and vessels. Sinuses: No frontal/maxillary tenderness ENT/Mouth: EACs patent / TMs  nl. Nares clear without erythema, swelling, mucoid exudates. Oral hygiene is good. No erythema, swelling, or exudate. Tongue normal,  non-obstructing. Tonsils not swollen or erythematous. Hearing normal.  Neck: Supple, thyroid normal. No bruits, nodes or JVD. Respiratory: Respiratory effort normal.  BS equal and clear bilateral without rales, rhonci, wheezing or stridor. Cardio: Heart sounds are normal with regular rate and rhythm and no murmurs, rubs or gallops. Peripheral pulses are normal and equal bilaterally without edema. No aortic or femoral bruits. Chest: symmetric with normal excursions and percussion. Breasts: Symmetric, without lumps, nipple discharge, retractions, or fibrocystic changes.  Abdomen: Flat, soft  with nl bowel sounds. Nontender, no guarding, rebound, hernias, masses, or organomegaly.  Lymphatics: Non tender without lymphadenopathy.   Genitourinary:  Musculoskeletal: Full ROM all peripheral extremities, joint stability, 5/5 strength, and normal gait. Skin: Warm and dry without rashes, lesions, cyanosis, clubbing or  ecchymosis.  Neuro: Cranial nerves intact, reflexes equal bilaterally. Normal muscle tone, no cerebellar symptoms. Sensation intact.  Pysch: Awake and oriented X 3, normal affect, Insight and Judgment appropriate.   Cognitive Testing  Alert? Yes  Normal Appearance?Yes  Oriented to person? Yes  Place? Yes   Time? Yes  Recall of three objects?  Yes  Can perform simple calculations? Yes  Displays appropriate judgment? Yes  Can read the correct time from a watch/clock?Yes  Medicare Attestation I have personally reviewed: The patient's medical and social history Their use of alcohol, tobacco or illicit drugs Their current medications and supplements The patient's functional ability including ADLs,fall risks, home safety risks, cognitive, and hearing and visual impairment Diet and physical activities Evidence for depression or mood disorders  The patient's weight, height, BMI, and visual acuity have been recorded in the chart.  I have made referrals, counseling, and provided education to the patient based on review of the above and I have provided the patient with a written personalized care plan for preventive services.    Lecil Tapp DAVID, MD   10/18/2014

## 2014-10-18 NOTE — Patient Instructions (Signed)

## 2014-10-19 LAB — URINALYSIS, MICROSCOPIC ONLY
Bacteria, UA: NONE SEEN
Casts: NONE SEEN

## 2014-10-19 LAB — HEPATIC FUNCTION PANEL
ALT: 13 U/L (ref 0–35)
AST: 17 U/L (ref 0–37)
Albumin: 4.1 g/dL (ref 3.5–5.2)
Alkaline Phosphatase: 76 U/L (ref 39–117)
Bilirubin, Direct: 0.1 mg/dL (ref 0.0–0.3)
Indirect Bilirubin: 0.4 mg/dL (ref 0.2–1.2)
Total Bilirubin: 0.5 mg/dL (ref 0.2–1.2)
Total Protein: 6.6 g/dL (ref 6.0–8.3)

## 2014-10-19 LAB — LIPID PANEL
Cholesterol: 236 mg/dL — ABNORMAL HIGH (ref 0–200)
HDL: 66 mg/dL (ref >39)
LDL Cholesterol: 127 mg/dL — ABNORMAL HIGH (ref 0–99)
Total CHOL/HDL Ratio: 3.6 Ratio
Triglycerides: 215 mg/dL — ABNORMAL HIGH (ref <150)
VLDL: 43 mg/dL — ABNORMAL HIGH (ref 0–40)

## 2014-10-19 LAB — BASIC METABOLIC PANEL WITH GFR
BUN: 15 mg/dL (ref 6–23)
CO2: 27 mEq/L (ref 19–32)
Calcium: 9.3 mg/dL (ref 8.4–10.5)
Chloride: 102 mEq/L (ref 96–112)
Creat: 0.65 mg/dL (ref 0.50–1.10)
GFR, Est African American: >89 mL/min
GFR, Est Non African American: >89 mL/min
Glucose, Bld: 87 mg/dL (ref 70–99)
Potassium: 4.1 mEq/L (ref 3.5–5.3)
Sodium: 140 mEq/L (ref 135–145)

## 2014-10-19 LAB — IRON AND TIBC
%SAT: 27 % (ref 20–55)
Iron: 94 ug/dL (ref 42–145)
TIBC: 354 ug/dL (ref 250–470)
UIBC: 260 ug/dL (ref 125–400)

## 2014-10-19 LAB — MICROALBUMIN / CREATININE URINE RATIO
Creatinine, Urine: 239.9 mg/dL
Microalb Creat Ratio: 3.3 mg/g (ref 0.0–30.0)
Microalb, Ur: 0.8 mg/dL (ref <2.0)

## 2014-10-19 LAB — MAGNESIUM: Magnesium: 1.8 mg/dL (ref 1.5–2.5)

## 2014-10-19 LAB — INSULIN, FASTING: Insulin fasting, serum: 6.9 u[IU]/mL (ref 2.0–19.6)

## 2014-10-19 LAB — VITAMIN D 25 HYDROXY (VIT D DEFICIENCY, FRACTURES): Vit D, 25-Hydroxy: 46 ng/mL (ref 30–100)

## 2014-10-19 LAB — TSH: TSH: 0.458 u[IU]/mL (ref 0.350–4.500)

## 2014-10-20 ENCOUNTER — Other Ambulatory Visit: Payer: Self-pay | Admitting: Internal Medicine

## 2014-11-09 LAB — COLOGUARD

## 2014-11-11 ENCOUNTER — Encounter: Payer: Self-pay | Admitting: Internal Medicine

## 2014-11-11 ENCOUNTER — Ambulatory Visit (INDEPENDENT_AMBULATORY_CARE_PROVIDER_SITE_OTHER): Payer: Medicare Other | Admitting: Internal Medicine

## 2014-11-11 VITALS — BP 112/68 | HR 76 | Temp 97.6°F | Resp 16 | Ht 62.25 in | Wt 152.4 lb

## 2014-11-11 DIAGNOSIS — M7061 Trochanteric bursitis, right hip: Secondary | ICD-10-CM

## 2014-11-11 DIAGNOSIS — M5441 Lumbago with sciatica, right side: Secondary | ICD-10-CM

## 2014-11-11 DIAGNOSIS — M7071 Other bursitis of hip, right hip: Secondary | ICD-10-CM

## 2014-11-11 DIAGNOSIS — I10 Essential (primary) hypertension: Secondary | ICD-10-CM

## 2014-11-11 MED ORDER — DEXAMETHASONE SODIUM PHOSPHATE 100 MG/10ML IJ SOLN
10.0000 mg | Freq: Once | INTRAMUSCULAR | Status: AC
  Administered 2014-11-11: 10 mg via INTRAMUSCULAR

## 2014-11-11 MED ORDER — GABAPENTIN 100 MG PO CAPS: ORAL_CAPSULE | ORAL | Status: AC

## 2014-11-11 MED ORDER — PREDNISONE 20 MG PO TABS: ORAL_TABLET | ORAL | Status: DC

## 2014-11-13 ENCOUNTER — Encounter: Payer: Self-pay | Admitting: Internal Medicine

## 2014-11-13 DIAGNOSIS — M545 Low back pain, unspecified: Secondary | ICD-10-CM | POA: Insufficient documentation

## 2014-11-13 DIAGNOSIS — M7061 Trochanteric bursitis, right hip: Secondary | ICD-10-CM | POA: Insufficient documentation

## 2014-11-13 NOTE — Progress Notes (Signed)
Subjective:    Patient ID: Regina Shaw, female    DOB: 1951-01-11, 64 y.o.   MRN: [id number]  HPI  Patient has controlled HTN, HLD, & Prediabetes and has had right sciatica persisting since her lumbar laminectomy  surg in Nov 2015. Sciatica is still very limiting - essentially continuous 24/7 and occasionally awakens her at night. Has had recent increase in pain in the right hip area as well as her pre-existing right sciatica.  Medication Sig  . aspirin EC 81 MG tablet Take 81 mg by mouth daily.  . cholecalciferol (VITAMIN D) 1000 UNITS tablet Take 4,000 Units by mouth daily after lunch.  . Multiple Vitamin (MULTIVITAMIN) tablet Take 1 tablet by mouth daily.  . sertraline (ZOLOFT) 50 MG tablet Take 1 tablet daily for mood   Allergies  Allergen Reactions  . Red Yeast Rice [Cholestin]     Dizzy  . Tape Other (See Comments)    Pt states that skin burns, and it hurts severely.  . Darvon [Propoxyphene] Nausea And Vomiting and Anxiety   PLAN: Past Surgical History  Procedure Laterality Date  . Tubal ligation    . Breast lum[facility]omy  09/2009    [facility]  . Lumbar disc surgery      [facility]  . Tonsillectomy and adenoidectomy  1960  . Cesarean section    . Laminectomy Right 07/18/2014    Procedure: Right Lumbar Four to Five Laminectomy for Synovial cyst;  Surgeon: Kristeen Miss, MD;  Location: [place] [facility];  Service: Neurosurgery;  Laterality: Right;  Right L4-5 Laminectomy for Synovial cyst   Review of Systems 10 point systems review negative except as above.    Objective:   Physical Exam BP 112/68   Pulse 76  Temp 97.6 F   Resp 16  Ht 5' 2.25"  Wt 152 lb 6.4 oz     BMI 27.66   Appears in moderate discomfort with walking.   HEENT - Eac's patent. TM's Nl. EOM's full. PERRLA. NasoOroPharynx clear. Neck - supple. Nl Thyroid. Carotids 2+ & No bruits, nodes, JVD Chest - Clear equal BS w/o Rales, rhonchi, wheezes. Cor - Nl HS. RRR w/o sig MGR. PP 1(+). No  edema. Abd - No palpable organomegaly, masses or tenderness. BS nl. MS- FROM w/o deformities. Muscle power, tone and bulk Nl. Sl limping Gait. (+) SLR on the right. Also has exquisite trigger point tenderness over the right hip greater trochanteric bursae.  Neuro - No obvious Cr N abnormalities. Sensory, motor and Cerebellar functions appear Nl w/o focal abnormalities. Psyche - Mental status normal & appropriate.  No delusions, ideations or obvious mood abnormalities.   With informed consent the area of the rt hip Gr troch bursae was preppy aseptic technique with alcohol ant then 1 cc of dexamethasone 10 mg admixed ewith 1% lidocaine was infiltrated into the bursae w/o difficulty and sterile dsg was applied.     Assessment & Plan:   1. Essential hypertension - controlled    2. Right-sided low back pain with right-sided sciatica  - predniSONE (DELTASONE) 20 MG tablet; 1 tab 3 x day for 3 days, then 1 tab 2 x day for 3 days, then 1 tab 1 x day for 5 days  Dispense: 20 tablet; Refill: 0 - gabapentin (NEURONTIN) 100 MG capsule; Take 1 tablet 3 x day for sciatica pain  Dispense: 90 capsule; Refill: 3 for neuropathic pain  3. Trochanteric bursitis of right hip (Mar 2016)  - predniSONE (DELTASONE) 20  MG tablet; 1 tab 3 x day for 3 days, then 1 tab 2 x day for 3 days, then 1 tab 1 x day for 5 days  Dispense: 20 tablet; Refill: 0 - gabapentin (NEURONTIN) 100 MG capsule; Take 1 tablet 3 x day for sciatica pain  Dispense: 90 capsule; Refill: 3 - dexamethasone (DECADRON) injection 10 mg; Inject 1 mL (10 mg total) into the muscle once.  - Meds/SE's discussed.  - ROV prn

## 2014-11-20 ENCOUNTER — Encounter: Payer: Self-pay | Admitting: *Deleted

## 2014-11-29 ENCOUNTER — Ambulatory Visit: Payer: Self-pay | Admitting: Internal Medicine

## 2014-12-07 ENCOUNTER — Ambulatory Visit (INDEPENDENT_AMBULATORY_CARE_PROVIDER_SITE_OTHER): Payer: Medicare Other | Admitting: Internal Medicine

## 2014-12-07 ENCOUNTER — Encounter: Payer: Self-pay | Admitting: Internal Medicine

## 2014-12-07 VITALS — BP 122/66 | HR 72 | Temp 97.3°F | Resp 16 | Ht 62.5 in | Wt 156.2 lb

## 2014-12-07 DIAGNOSIS — E785 Hyperlipidemia, unspecified: Secondary | ICD-10-CM

## 2014-12-07 DIAGNOSIS — I10 Essential (primary) hypertension: Secondary | ICD-10-CM

## 2014-12-07 NOTE — Progress Notes (Signed)
   Subjective:    Patient ID: Regina Shaw, female    DOB: Oct 23, 1950, 64 y.o.   MRN: [id number]  HPIPatient is seen in 1 month f/u of Rt sciatica & hip pain treated with a decadron injection followed by a pulse/taper of prednisone an trial on bedtime Gabapentin to help with her pain and she reports dramatic improvement. Today she is reporting some burning paresthesias of the right anterolateral thigh suspect for Meralgia Paresthetica. Also last OV labs found lipids very high.  Lab Results  Component Value Date   CHOL 236* 10/18/2014   HDL 66 10/18/2014   LDLCALC 127* 10/18/2014   LDLDIRECT 139.2 04/29/2012   TRIG 215* 10/18/2014   CHOLHDL 3.6 10/18/2014    Patient prefers to try harder with diet as she has had intolerant reactions in the past to Pravastatin,Gemfibrozil and Zetia. She does admit that she has been very lax with her diet.  Medication Sig  . aspirin EC 81 MG tablet Take 81 mg by mouth daily.  . cholecalciferol (VITAMIN D) 1000 UNITS tablet Take 4,000 Units by mouth daily after lunch.  . gabapentin (NEURONTIN) 100 MG capsule Take 1 tablet 3 x day for sciatica pain  . gabapentin (NEURONTIN) 300 MG capsule Take 300 mg by mouth at bedtime.  . Multiple Vitamin (MULTIVITAMIN) tablet Take 1 tablet by mouth daily.  . sertraline (ZOLOFT) 50 MG tablet Take 1 tablet daily for mood  . ibuprofen (ADVIL,MOTRIN) 200 MG tablet Take 200 mg by mouth. Patient has been taking 6 daily for hip pain.   Allergies  Allergen Reactions  . Red Yeast Rice [Cholestin]     Dizzy  . Tape Other (See Comments)    Pt states that skin burns, and it hurts severely.  . Darvon [Propoxyphene] Nausea And Vomiting and Anxiety   Past Medical History  Diagnosis Date  . DCIS (ductal carcinoma in situ) of breast 04/29/2012  . Hyperlipidemia   . Vitamin D deficiency   . Cancer    Review of Systems 10 point systems review negative except as above.    Objective:   Physical Exam  BP 122/66 mmHg  Pulse 72   Temp(Src) 97.3 F (36.3 C)  Resp 16  Ht 5' 2.5" (1.588 m)  Wt 156 lb 3.2 oz (70.852 kg)  BMI 28.10 kg/m2  HEENT - Eac's patent. TM's Nl. EOM's full. PERRLA. NasoOroPharynx clear. Neck - supple. Nl Thyroid. Carotids 2+ & No bruits, nodes, JVD Chest - Clear equal BS w/o Rales, rhonchi, wheezes. Cor - Nl HS. RRR w/o sig MGR. PP 1(+). No edema. Abd - No palpable organomegaly, masses or tenderness. BS nl. MS- FROM w/o deformities. Muscle power, tone and bulk Nl. Gait Nl. Neuro - No obvious Cr N abnormalities. Sensory, motor and Cerebellar functions appear Nl w/o focal abnormalities. (-) SLR. Psyche - Mental status normal & appropriate.  No delusions, ideations or obvious mood abnormalities. Skin - clear exposed.    Assessment & Plan:   1. Hyperlipidemia  - defer labs til f/u OV  2. Essential hypertension

## 2015-02-09 DIAGNOSIS — M4310 Spondylolisthesis, site unspecified: Secondary | ICD-10-CM | POA: Insufficient documentation

## 2015-02-09 DIAGNOSIS — M48061 Spinal stenosis, lumbar region without neurogenic claudication: Secondary | ICD-10-CM | POA: Insufficient documentation

## 2015-04-12 ENCOUNTER — Encounter: Payer: Self-pay | Admitting: Internal Medicine

## 2015-04-12 ENCOUNTER — Ambulatory Visit (INDEPENDENT_AMBULATORY_CARE_PROVIDER_SITE_OTHER): Payer: Medicare Other | Admitting: Internal Medicine

## 2015-04-12 ENCOUNTER — Ambulatory Visit: Payer: Self-pay | Admitting: Internal Medicine

## 2015-04-12 VITALS — BP 106/66 | HR 72 | Temp 97.3°F | Resp 16 | Ht 62.5 in | Wt 162.6 lb

## 2015-04-12 DIAGNOSIS — Z6829 Body mass index (BMI) 29.0-29.9, adult: Secondary | ICD-10-CM | POA: Diagnosis not present

## 2015-04-12 DIAGNOSIS — E559 Vitamin D deficiency, unspecified: Secondary | ICD-10-CM

## 2015-04-12 DIAGNOSIS — Z79899 Other long term (current) drug therapy: Secondary | ICD-10-CM

## 2015-04-12 DIAGNOSIS — E785 Hyperlipidemia, unspecified: Secondary | ICD-10-CM

## 2015-04-12 DIAGNOSIS — M5442 Lumbago with sciatica, left side: Secondary | ICD-10-CM | POA: Diagnosis not present

## 2015-04-12 DIAGNOSIS — M5441 Lumbago with sciatica, right side: Secondary | ICD-10-CM | POA: Diagnosis not present

## 2015-04-12 DIAGNOSIS — I10 Essential (primary) hypertension: Secondary | ICD-10-CM | POA: Diagnosis not present

## 2015-04-12 DIAGNOSIS — R7303 Prediabetes: Secondary | ICD-10-CM

## 2015-04-12 DIAGNOSIS — R7309 Other abnormal glucose: Secondary | ICD-10-CM | POA: Diagnosis not present

## 2015-04-12 LAB — BASIC METABOLIC PANEL WITH GFR
BUN: 16 mg/dL (ref 7–25)
CO2: 19 mmol/L — ABNORMAL LOW (ref 20–31)
Calcium: 8.9 mg/dL (ref 8.6–10.4)
Chloride: 107 mmol/L (ref 98–110)
Creat: 0.63 mg/dL (ref 0.50–0.99)
GFR, Est African American: >89 mL/min (ref >=60)
GFR, Est Non African American: >89 mL/min (ref >=60)
Glucose, Bld: 95 mg/dL (ref 65–99)
Potassium: 4.2 mmol/L (ref 3.5–5.3)
Sodium: 137 mmol/L (ref 135–146)

## 2015-04-12 LAB — CBC WITH DIFFERENTIAL/PLATELET
Basophils Absolute: 0.1 10*3/uL (ref 0.0–0.1)
Basophils Relative: 1 % (ref 0–1)
Eosinophils Absolute: 0.2 10*3/uL (ref 0.0–0.7)
Eosinophils Relative: 3 % (ref 0–5)
HCT: 41.4 % (ref 36.0–46.0)
Hemoglobin: 14.1 g/dL (ref 12.0–15.0)
Lymphocytes Relative: 40 % (ref 12–46)
Lymphs Abs: 2.4 10*3/uL (ref 0.7–4.0)
MCH: 29.6 pg (ref 26.0–34.0)
MCHC: 34.1 g/dL (ref 30.0–36.0)
MCV: 87 fL (ref 78.0–100.0)
MPV: 10.8 fL (ref 8.6–12.4)
Monocytes Absolute: 0.4 10*3/uL (ref 0.1–1.0)
Monocytes Relative: 6 % (ref 3–12)
Neutro Abs: 3 10*3/uL (ref 1.7–7.7)
Neutrophils Relative %: 50 % (ref 43–77)
Platelets: 202 10*3/uL (ref 150–400)
RBC: 4.76 MIL/uL (ref 3.87–5.11)
RDW: 13.7 % (ref 11.5–15.5)
WBC: 6 10*3/uL (ref 4.0–10.5)

## 2015-04-12 LAB — LIPID PANEL
Cholesterol: 227 mg/dL — ABNORMAL HIGH (ref 125–200)
HDL: 66 mg/dL (ref >=46)
LDL Cholesterol: 124 mg/dL (ref <130)
Total CHOL/HDL Ratio: 3.4 Ratio (ref <=5.0)
Triglycerides: 186 mg/dL — ABNORMAL HIGH (ref <150)
VLDL: 37 mg/dL — ABNORMAL HIGH (ref <30)

## 2015-04-12 LAB — HEPATIC FUNCTION PANEL
ALT: 11 U/L (ref 6–29)
AST: 19 U/L (ref 10–35)
Albumin: 3.9 g/dL (ref 3.6–5.1)
Alkaline Phosphatase: 69 U/L (ref 33–130)
Bilirubin, Direct: 0.1 mg/dL (ref <=0.2)
Indirect Bilirubin: 0.3 mg/dL (ref 0.2–1.2)
Total Bilirubin: 0.4 mg/dL (ref 0.2–1.2)
Total Protein: 6.4 g/dL (ref 6.1–8.1)

## 2015-04-12 LAB — MAGNESIUM: Magnesium: 1.8 mg/dL (ref 1.5–2.5)

## 2015-04-12 LAB — TSH: TSH: 0.723 u[IU]/mL (ref 0.350–4.500)

## 2015-04-12 MED ORDER — GABAPENTIN 300 MG PO CAPS: ORAL_CAPSULE | ORAL | Status: DC

## 2015-04-12 NOTE — Progress Notes (Signed)
Patient ID: Regina Shaw, female   DOB: 19-May-1951, 64 y.o.   MRN: [id number]    This very nice 64 y.o. [facility] presents for follow up with Labile Hypertension, Hyperlipidemia, Pre-Diabetes and Vitamin D Deficiency.     Patient also shares concerns wrt ongoing LBP & Rt sciatica predating approx 1 year to surg by Dr Ellene Route for a spinal synovial cyst. MRI in May was reported non revealing for expklanations of her pain. She recently had a 2sd opinion by a Dr Redmond Pulling in June at the [facility] in [place] and felt no solutions were entertained. Dr Ellene Route apparently has recommended a Myelogram to evaluate for recurrent synovial cyst. She relates her pain is worst with recumbancy during the night and less severe during the daytime when she is up & active. She reports some relief with Gabapentin 1 dose at bedtime.    Patient is followed expectantly for Labile HTN & BP has been controlled and today's BP: 106/66 mmHg. Patient has had no complaints of any cardiac type chest pain, palpitations, dyspnea/orthopnea/PND, dizziness, claudication, or dependent edema.    Hyperlipidemia is controlled with diet. Patient denies myalgias or other med SE's. Last Lipids were not at goal - Cholesterol 236; HDL 66; LDL 127; and elevated Triglycerides 215 on  10/18/2014.     Also, the patient has history of PreDiabetes predating since Mar 2011 with A1c 6.3% and has had no symptoms of reactive hypoglycemia, diabetic polys, paresthesias or visual blurring.  Last A1c was  5.8% on  10/18/2014.    Further, the patient also has history of Vitamin D Deficiency of 24 in 2008  and supplements vitamin D without any suspected side-effects. Last vitamin D was still relatively low at 46 on 10/18/2014.    Medication Sig  . aspirin EC 81 MG tablet Take 81 mg by mouth daily.  Marland Kitchen VITAMIN D 1000 UNITS tablet Take 4,000 Units by mouth daily after lunch.  . gabapentin  300 MG capsule Take 300 mg by mouth at bedtime.  . MULTIVITAMIN Take 1 tablet by mouth daily.    Allergies  Allergen Reactions  . Red Yeast Rice [Cholestin]     Dizzy  . Tape Other (See Comments)    Pt states that skin burns, and it hurts severely.  . Darvon [Propoxyphene] Nausea And Vomiting and Anxiety   PMHx:   Past Medical History  Diagnosis Date  . DCIS (ductal carcinoma in situ) of breast 04/29/2012  . Hyperlipidemia   . Vitamin D deficiency   . Cancer    Immunization History  Administered Date(s) Administered  . Influenza, High Dose Seasonal PF 05/05/2014  . PPD Test 10/18/2013  . Pneumococcal Conjugate-13 05/05/2014  . Pneumococcal Polysaccharide-23 10/18/2014  . Tdap 10/18/2014  . Zoster 11/06/2009   Past Surgical History  Procedure Laterality Date  . Tubal ligation    . Breast lumpectomy  09/2009    [facility]  . Lumbar disc surgery      [facility]  . Tonsillectomy and adenoidectomy  1960  . Cesarean section    . Laminectomy Right 07/18/2014    Procedure: Right Lumbar Four to Five Laminectomy for Synovial cyst;  Surgeon: Kristeen Miss, MD;  Location: [place] [facility];  Service: Neurosurgery;  Laterality: Right;  Right L4-5 Laminectomy for Synovial cyst   FHx:    Reviewed / unchanged  SHx:    Reviewed / unchanged  Systems Review:  Constitutional: Denies fever, chills, wt changes, headaches, insomnia, fatigue, night sweats, change in appetite.  Eyes: Denies redness, blurred vision, diplopia, discharge, itchy, watery eyes.  ENT: Denies discharge, congestion, post nasal drip, epistaxis, sore throat, earache, hearing loss, dental pain, tinnitus, vertigo, sinus pain, snoring.  CV: Denies chest pain, palpitations, irregular heartbeat, syncope, dyspnea, diaphoresis, orthopnea, PND, claudication or edema. Respiratory: denies cough, dyspnea, DOE, pleurisy, hoarseness, laryngitis, wheezing.  Gastrointestinal: Denies dysphagia, odynophagia, heartburn, reflux, water brash, abdominal pain or cramps, nausea, vomiting, bloating, diarrhea, constipation, hematemesis,  melena, hematochezia  or hemorrhoids. Genitourinary: Denies dysuria, frequency, urgency, nocturia, hesitancy, discharge, hematuria or flank pain. Musculoskeletal: Denies arthralgias, myalgias, stiffness, jt. swelling, pain, limping or strain/sprain.  Skin: Denies pruritus, rash, hives, warts, acne, eczema or change in skin lesion(s). Neuro: No weakness, tremor, incoordination, spasms, paresthesia or pain. Psychiatric: Denies confusion, memory loss or sensory loss. Endo: Denies change in weight, skin or hair change.  Heme/Lymph: No excessive bleeding, bruising or enlarged lymph nodes.  Physical Exam  BP 106/66  Pulse 72  Temp 97.3 F   Resp 16  Ht 5' 2.5"   Wt 162 lb 9.6 oz     BMI 29.25  Appears well nourished and in no distress. Eyes: PERRLA, EOMs, conjunctiva no swelling or erythema. Sinuses: No frontal/maxillary tenderness ENT/Mouth: EAC's clear, TM's nl w/o erythema, bulging. Nares clear w/o erythema, swelling, exudates. Oropharynx clear without erythema or exudates. Oral hygiene is good. Tongue normal, non obstructing. Hearing intact.  Neck: Supple. Thyroid nl. Car 2+/2+ without bruits, nodes or JVD. Chest: Respirations nl with BS clear & equal w/o rales, rhonchi, wheezing or stridor.  Cor: Heart sounds normal w/ regular rate and rhythm without sig. murmurs, gallops, clicks, or rubs. Peripheral pulses normal and equal  without edema.  Abdomen: Soft & bowel sounds normal. Non-tender w/o guarding, rebound, hernias, masses, or organomegaly.  Lymphatics: Unremarkable.  Musculoskeletal: Full ROM all peripheral extremities, joint stability, 5/5 strength, and normal gait.  Skin: Warm, dry without exposed rashes, lesions or ecchymosis apparent.  Neuro: Cranial nerves intact, reflexes equal bilaterally. Sensory-motor testing grossly intact. Tendon reflexes grossly intact. (-) SLR. Pysch: Alert & oriented x 3.  Insight and judgement nl & appropriate. No ideations.  Assessment and  Plan:  1. Essential hypertension  - TSH  2. Hyperlipidemia  - Lipid panel  3. Prediabetes  - Hemoglobin A1c - Insulin, random  4. Vitamin D deficiency  - Vit D  25 hydroxy   5. Bilateral low back pain with sciatica  - gabapentin (NEURONTIN) 300 MG capsule; Take 2 - 3 capsules day as directed for pain  Dispense: 90 capsule; Refill: 99  -Advised to try 1 dose a suppertime and 1-2 at bedtime.  6. Medication management  - CBC with Differential/Platelet - BASIC METABOLIC PANEL WITH GFR - Hepatic function panel - Magnesium  7. BMI 29.0-29.9,adult   Recommended regular exercise, BP monitoring, weight control, and discussed med and SE's. Recommended labs to assess and monitor clinical status. Further disposition pending results of labs. Over 30 minutes of exam, counseling, chart review was performed

## 2015-04-12 NOTE — Patient Instructions (Signed)

## 2015-04-13 ENCOUNTER — Other Ambulatory Visit: Payer: Self-pay | Admitting: Internal Medicine

## 2015-04-13 DIAGNOSIS — E782 Mixed hyperlipidemia: Secondary | ICD-10-CM

## 2015-04-13 LAB — VITAMIN D 25 HYDROXY (VIT D DEFICIENCY, FRACTURES): Vit D, 25-Hydroxy: 52 ng/mL (ref 30–100)

## 2015-04-13 LAB — HEMOGLOBIN A1C
Hgb A1c MFr Bld: 5.9 % — ABNORMAL HIGH (ref <5.7)
Mean Plasma Glucose: 123 mg/dL — ABNORMAL HIGH (ref <117)

## 2015-04-13 LAB — INSULIN, RANDOM: Insulin: 8.5 u[IU]/mL (ref 2.0–19.6)

## 2015-04-13 MED ORDER — ATORVASTATIN CALCIUM 80 MG PO TABS: ORAL_TABLET | ORAL | Status: DC

## 2015-04-25 ENCOUNTER — Telehealth: Payer: Self-pay | Admitting: *Deleted

## 2015-04-25 ENCOUNTER — Encounter: Payer: Self-pay | Admitting: Gastroenterology

## 2015-04-25 NOTE — Telephone Encounter (Signed)
Patient called and states she started the Atorvastatin and is having pain in her legs and joints, to the extent she feels like she cannot walk.  Per Dr Melford Aase, stop the med for 1 week and call back in 1 week to report on her pain.

## 2015-04-29 ENCOUNTER — Encounter: Payer: Self-pay | Admitting: *Deleted

## 2015-06-15 ENCOUNTER — Ambulatory Visit (INDEPENDENT_AMBULATORY_CARE_PROVIDER_SITE_OTHER): Payer: Medicare Other

## 2015-06-15 DIAGNOSIS — Z23 Encounter for immunization: Secondary | ICD-10-CM | POA: Diagnosis not present

## 2015-07-20 ENCOUNTER — Encounter: Payer: Self-pay | Admitting: Internal Medicine

## 2015-07-20 ENCOUNTER — Ambulatory Visit (INDEPENDENT_AMBULATORY_CARE_PROVIDER_SITE_OTHER): Payer: Medicare Other | Admitting: Internal Medicine

## 2015-07-20 VITALS — BP 146/74 | HR 68 | Temp 98.2°F | Resp 18 | Ht 62.5 in | Wt 156.0 lb

## 2015-07-20 DIAGNOSIS — E559 Vitamin D deficiency, unspecified: Secondary | ICD-10-CM

## 2015-07-20 DIAGNOSIS — I10 Essential (primary) hypertension: Secondary | ICD-10-CM | POA: Diagnosis not present

## 2015-07-20 DIAGNOSIS — J309 Allergic rhinitis, unspecified: Secondary | ICD-10-CM

## 2015-07-20 DIAGNOSIS — R7303 Prediabetes: Secondary | ICD-10-CM | POA: Diagnosis not present

## 2015-07-20 DIAGNOSIS — Z79899 Other long term (current) drug therapy: Secondary | ICD-10-CM | POA: Diagnosis not present

## 2015-07-20 DIAGNOSIS — E785 Hyperlipidemia, unspecified: Secondary | ICD-10-CM | POA: Diagnosis not present

## 2015-07-20 LAB — BASIC METABOLIC PANEL WITH GFR
BUN: 9 mg/dL (ref 7–25)
CO2: 24 mmol/L (ref 20–31)
Calcium: 9.3 mg/dL (ref 8.6–10.4)
Chloride: 104 mmol/L (ref 98–110)
Creat: 0.62 mg/dL (ref 0.50–0.99)
GFR, Est African American: >89 mL/min (ref >=60)
GFR, Est Non African American: >89 mL/min (ref >=60)
Glucose, Bld: 95 mg/dL (ref 65–99)
Potassium: 3.6 mmol/L (ref 3.5–5.3)
Sodium: 139 mmol/L (ref 135–146)

## 2015-07-20 LAB — HEPATIC FUNCTION PANEL
ALT: 15 U/L (ref 6–29)
AST: 16 U/L (ref 10–35)
Albumin: 4.1 g/dL (ref 3.6–5.1)
Alkaline Phosphatase: 66 U/L (ref 33–130)
Bilirubin, Direct: 0.1 mg/dL (ref <=0.2)
Indirect Bilirubin: 0.5 mg/dL (ref 0.2–1.2)
Total Bilirubin: 0.6 mg/dL (ref 0.2–1.2)
Total Protein: 6.5 g/dL (ref 6.1–8.1)

## 2015-07-20 LAB — CBC WITH DIFFERENTIAL/PLATELET
Basophils Absolute: 0 10*3/uL (ref 0.0–0.1)
Basophils Relative: 0 % (ref 0–1)
Eosinophils Absolute: 0.1 10*3/uL (ref 0.0–0.7)
Eosinophils Relative: 2 % (ref 0–5)
HCT: 42.2 % (ref 36.0–46.0)
Hemoglobin: 14.5 g/dL (ref 12.0–15.0)
Lymphocytes Relative: 35 % (ref 12–46)
Lymphs Abs: 2.1 10*3/uL (ref 0.7–4.0)
MCH: 29.8 pg (ref 26.0–34.0)
MCHC: 34.4 g/dL (ref 30.0–36.0)
MCV: 86.7 fL (ref 78.0–100.0)
MPV: 10.1 fL (ref 8.6–12.4)
Monocytes Absolute: 0.4 10*3/uL (ref 0.1–1.0)
Monocytes Relative: 7 % (ref 3–12)
Neutro Abs: 3.4 10*3/uL (ref 1.7–7.7)
Neutrophils Relative %: 56 % (ref 43–77)
Platelets: 181 10*3/uL (ref 150–400)
RBC: 4.87 MIL/uL (ref 3.87–5.11)
RDW: 14.8 % (ref 11.5–15.5)
WBC: 6.1 10*3/uL (ref 4.0–10.5)

## 2015-07-20 LAB — LIPID PANEL
Cholesterol: 235 mg/dL — ABNORMAL HIGH (ref 125–200)
HDL: 77 mg/dL (ref >=46)
LDL Cholesterol: 139 mg/dL — ABNORMAL HIGH (ref <130)
Total CHOL/HDL Ratio: 3.1 Ratio (ref <=5.0)
Triglycerides: 95 mg/dL (ref <150)
VLDL: 19 mg/dL (ref <30)

## 2015-07-20 MED ORDER — PREDNISONE 20 MG PO TABS: ORAL_TABLET | ORAL | Status: DC

## 2015-07-20 NOTE — Progress Notes (Signed)
Assessment and Plan:  Hypertension:  -Continue medication,  -monitor blood pressure at home.  -Continue DASH diet.   -Reminder to go to the ER if any CP, SOB, nausea, dizziness, severe HA, changes vision/speech, left arm numbness and tingling, and jaw pain.  Cholesterol: -Continue diet and exercise.  -Check cholesterol.   Pre-diabetes: -Continue diet and exercise.  -Check A1C  Vitamin D Def: -check level -continue medications.   Allergic rhinitis -antihistamin -intranasal steroid -prednisone in event that neither of these work  [place] and meds as discussed. Further disposition pending results of labs.  HPI 64 y.o. female  presents for 3 month follow up with hypertension, hyperlipidemia, prediabetes and vitamin D.   Her blood pressure has been controlled at home, today their BP is BP: (!) 146/74 mmHg.   She does workout. She denies chest pain, shortness of breath, dizziness.   She is not on cholesterol medication and denies myalgias. Her cholesterol is not at goal. The cholesterol last visit was:   Lab Results  Component Value Date   CHOL 227* 04/12/2015   HDL 66 04/12/2015   LDLCALC 124 04/12/2015   LDLDIRECT 139.2 04/29/2012   TRIG 186* 04/12/2015   CHOLHDL 3.4 04/12/2015  She feels that she cannot tolerate statin drugs or red yeast rice.  She did start taking fish oil which she feels is helping with her vision and also to help her cholesterol.    She has been working on diet and exercise for prediabetes, and denies foot ulcerations, hyperglycemia, hypoglycemia , increased appetite, nausea, paresthesia of the feet, polydipsia, polyuria, visual disturbances, vomiting and weight loss. Last A1C in the office was:  Lab Results  Component Value Date   HGBA1C 5.9* 04/12/2015    Patient is on Vitamin D supplement.  Lab Results  Component Value Date   VD25OH 52 04/12/2015     Patient reports mild coughing and nasal drainage x 1 week.  She reports that she hasn't  done anything for it.  .   Current Medications:  Current Outpatient Prescriptions on File Prior to Visit  Medication Sig Dispense Refill  . aspirin EC 81 MG tablet Take 81 mg by mouth daily.    . cholecalciferol (VITAMIN D) 1000 UNITS tablet Take 5,000 Units by mouth 2 (two) times daily.     . Multiple Vitamin (MULTIVITAMIN) tablet Take 1 tablet by mouth daily.     No current facility-administered medications on file prior to visit.    Medical History:  Past Medical History  Diagnosis Date  . DCIS (ductal carcinoma in situ) of breast 04/29/2012  . Hyperlipidemia   . Vitamin D deficiency   . Cancer ([place])     Allergies:  Allergies  Allergen Reactions  . Red Yeast Rice [Cholestin]     Dizzy  . Tape Other (See Comments)    Pt states that skin burns, and it hurts severely.  . Darvon [Propoxyphene] Nausea And Vomiting and Anxiety     Review of Systems:  Review of Systems  Constitutional: Negative for fever, chills and malaise/fatigue.  HENT: Positive for congestion. Negative for ear pain and sore throat.   Eyes: Negative for blurred vision.  Respiratory: Positive for cough. Negative for shortness of breath and wheezing.   Cardiovascular: Negative for chest pain, palpitations and leg swelling.  Gastrointestinal: Negative for heartburn, diarrhea, constipation, blood in stool and melena.  Genitourinary: Negative.   Skin: Negative.   Neurological: Negative for dizziness, sensory change, loss of consciousness and headaches.  Psychiatric/Behavioral: Negative for depression. The patient is not nervous/anxious and does not have insomnia.     Family history- Review and unchanged  Social history- Review and unchanged  Physical Exam: BP 146/74 mmHg  Pulse 68  Temp(Src) 98.2 F (36.8 C) (Temporal)  Resp 18  Ht 5' 2.5" (1.588 m)  Wt 156 lb (70.761 kg)  BMI 28.06 kg/m2 Wt Readings from Last 3 Encounters:  07/20/15 156 lb (70.761 kg)  04/12/15 162 lb 9.6 oz (73.755 kg)   12/07/14 156 lb 3.2 oz (70.852 kg)    General Appearance: Well nourished well developed, in no apparent distress. Eyes: PERRLA, EOMs, conjunctiva no swelling or erythema ENT/Mouth: Ear canals normal without obstruction, swelling, erythma, discharge.  TMs normal bilaterally.  Oropharynx moist, clear, without exudate, or postoropharyngeal swelling. Neck: Supple, thyroid normal,no cervical adenopathy  Respiratory: Respiratory effort normal, Breath sounds clear A&P without rhonchi, wheeze, or rale.  No retractions, no accessory usage. Cardio: RRR with no MRGs. Brisk peripheral pulses without edema.  Abdomen: Soft, + BS,  Non tender, no guarding, rebound, hernias, masses. Musculoskeletal: Full ROM, 5/5 strength, Normal gait Skin: Warm, dry without rashes, lesions, ecchymosis.  Neuro: Awake and oriented X 3, Cranial nerves intact. Normal muscle tone, no cerebellar symptoms. Psych: Normal affect, Insight and Judgment appropriate.    Starlyn Skeans, PA-C 9:43 AM [facility]

## 2015-07-24 ENCOUNTER — Other Ambulatory Visit: Payer: Self-pay | Admitting: Internal Medicine

## 2015-07-24 MED ORDER — BENZONATATE 200 MG PO CAPS: 200.0000 mg | ORAL_CAPSULE | Freq: Three times a day (TID) | ORAL | Status: DC | PRN

## 2015-11-07 ENCOUNTER — Encounter: Payer: Self-pay | Admitting: Internal Medicine

## 2015-11-07 ENCOUNTER — Ambulatory Visit (INDEPENDENT_AMBULATORY_CARE_PROVIDER_SITE_OTHER): Payer: Medicare Other | Admitting: Internal Medicine

## 2015-11-07 VITALS — BP 142/84 | HR 76 | Temp 97.8°F | Resp 16 | Ht 62.0 in | Wt 158.4 lb

## 2015-11-07 DIAGNOSIS — Z79899 Other long term (current) drug therapy: Secondary | ICD-10-CM

## 2015-11-07 DIAGNOSIS — E559 Vitamin D deficiency, unspecified: Secondary | ICD-10-CM

## 2015-11-07 DIAGNOSIS — I10 Essential (primary) hypertension: Secondary | ICD-10-CM

## 2015-11-07 DIAGNOSIS — Z0000 Encounter for general adult medical examination without abnormal findings: Secondary | ICD-10-CM

## 2015-11-07 DIAGNOSIS — Z1212 Encounter for screening for malignant neoplasm of rectum: Secondary | ICD-10-CM

## 2015-11-07 DIAGNOSIS — Z0001 Encounter for general adult medical examination with abnormal findings: Secondary | ICD-10-CM

## 2015-11-07 DIAGNOSIS — R7303 Prediabetes: Secondary | ICD-10-CM

## 2015-11-07 DIAGNOSIS — E785 Hyperlipidemia, unspecified: Secondary | ICD-10-CM

## 2015-11-07 LAB — CBC WITH DIFFERENTIAL/PLATELET
Basophils Absolute: 0.1 10*3/uL (ref 0.0–0.1)
Basophils Relative: 1 % (ref 0–1)
Eosinophils Absolute: 0.1 10*3/uL (ref 0.0–0.7)
Eosinophils Relative: 2 % (ref 0–5)
HCT: 41.5 % (ref 36.0–46.0)
Hemoglobin: 13.9 g/dL (ref 12.0–15.0)
Lymphocytes Relative: 40 % (ref 12–46)
Lymphs Abs: 2.6 10*3/uL (ref 0.7–4.0)
MCH: 29.2 pg (ref 26.0–34.0)
MCHC: 33.5 g/dL (ref 30.0–36.0)
MCV: 87.2 fL (ref 78.0–100.0)
MPV: 10.6 fL (ref 8.6–12.4)
Monocytes Absolute: 0.4 10*3/uL (ref 0.1–1.0)
Monocytes Relative: 6 % (ref 3–12)
Neutro Abs: 3.3 10*3/uL (ref 1.7–7.7)
Neutrophils Relative %: 51 % (ref 43–77)
Platelets: 179 10*3/uL (ref 150–400)
RBC: 4.76 MIL/uL (ref 3.87–5.11)
RDW: 13.1 % (ref 11.5–15.5)
WBC: 6.4 10*3/uL (ref 4.0–10.5)

## 2015-11-07 LAB — LIPID PANEL
Cholesterol: 223 mg/dL — ABNORMAL HIGH (ref 125–200)
HDL: 72 mg/dL (ref >=46)
LDL Cholesterol: 125 mg/dL (ref <130)
Total CHOL/HDL Ratio: 3.1 Ratio (ref <=5.0)
Triglycerides: 130 mg/dL (ref <150)
VLDL: 26 mg/dL (ref <30)

## 2015-11-07 LAB — BASIC METABOLIC PANEL WITH GFR
BUN: 12 mg/dL (ref 7–25)
CO2: 26 mmol/L (ref 20–31)
Calcium: 9.5 mg/dL (ref 8.6–10.4)
Chloride: 102 mmol/L (ref 98–110)
Creat: 0.81 mg/dL (ref 0.50–0.99)
GFR, Est African American: 88 mL/min (ref >=60)
GFR, Est Non African American: 76 mL/min (ref >=60)
Glucose, Bld: 102 mg/dL — ABNORMAL HIGH (ref 65–99)
Potassium: 3.7 mmol/L (ref 3.5–5.3)
Sodium: 139 mmol/L (ref 135–146)

## 2015-11-07 LAB — MAGNESIUM: Magnesium: 1.8 mg/dL (ref 1.5–2.5)

## 2015-11-07 LAB — HEPATIC FUNCTION PANEL
ALT: 12 U/L (ref 6–29)
AST: 15 U/L (ref 10–35)
Albumin: 4 g/dL (ref 3.6–5.1)
Alkaline Phosphatase: 73 U/L (ref 33–130)
Bilirubin, Direct: 0.1 mg/dL (ref <=0.2)
Indirect Bilirubin: 0.4 mg/dL (ref 0.2–1.2)
Total Bilirubin: 0.5 mg/dL (ref 0.2–1.2)
Total Protein: 6.5 g/dL (ref 6.1–8.1)

## 2015-11-07 LAB — TSH: TSH: 0.68 mIU/L

## 2015-11-07 NOTE — Patient Instructions (Signed)

## 2015-11-07 NOTE — Progress Notes (Signed)
Patient ID: Regina Shaw, female   DOB: May 03, 1951, 65 y.o.   MRN: [id number]  Annual Screening/Preventative Visit And Comprehensive Evaluation &  Examination  This very nice 65 y.o. [facility] presents for a Wellness/Preventative Visit & comprehensive evaluation and management of multiple medical co-morbidities.  Patient has been followed for HTN, T2_NIDDM  Prediabetes, Hyperlipidemia and Vitamin D Deficiency. In 2011 Patient was found to have a L breast CIS tx'd by lumpectomy and subsequent Radiation and has her annual f/u [facility] at [facility] in [place].     Patient has been followed expectantly for labile HTN predates since 2009. Patient's BP has been controlled at home and patient denies any cardiac symptoms as chest pain, palpitations, shortness of breath, dizziness or ankle swelling. Today's BP is 142/84.     Patient's hyperlipidemia is not controlled with diet as patient is very adverse to taking meds for Cholesterol. Patient denies myalgias or other medication SE's. Last lipids were not at goal with Cholesterol 235; HDL 77; LDL 139; and Triglycerides 95 on 07/20/2015.   Patient has prediabetes predating since 2011 with A1c 6.3%  and patient denies reactive hypoglycemic symptoms, visual blurring, diabetic polys, or paresthesias. Last A1c was  5.9% on 04/12/2015.   Finally, patient has history of Vitamin D Deficiency of "24" in 2008 and last Vitamin D was 52 on 04/12/2015.    Medication Sig  . aspirin EC 81 MG tablet Take 81 mg by mouth daily.  . cholecalciferol (VITAMIN D) 1000 UNITS tablet Take 5,000 Units by mouth 2 (two) times daily.   . Multiple Vitamin (MULTIVITAMIN) tablet Take 1 tablet by mouth daily.  . Omega-3 Fatty Acids (FISH OIL PO) Take 2,000 mg by mouth daily.   Allergies  Allergen Reactions  . Red Yeast Rice [Cholestin]     Dizzy  . Tape Other (See Comments)    Pt states that skin burns, and it hurts severely.  . Darvon [Propoxyphene] Nausea And Vomiting and  Anxiety   Past Medical History  Diagnosis Date  . DCIS (ductal carcinoma in situ) of breast 04/29/2012  . Hyperlipidemia   . Vitamin D deficiency   . Cancer [facility])    Health Maintenance  Topic Date Due  . MAMMOGRAM  08/02/2013  . INFLUENZA VACCINE  04/02/2016  . TETANUS/TDAP  10/18/2024  . COLONOSCOPY  11/15/2024  . DEXA SCAN  Completed  . ZOSTAVAX  Completed  . Hepatitis C Screening  Completed  . PNA vac Low Risk Adult  Completed   Immunization History  Administered Date(s) Administered  . Influenza, High Dose Seasonal PF 05/05/2014, 06/15/2015  . PPD Test 10/18/2013  . Pneumococcal Conjugate-13 05/05/2014  . Pneumococcal Polysaccharide-23 10/18/2014  . Tdap 10/18/2014  . Zoster 11/06/2009   Past Surgical History  Procedure Laterality Date  . Tubal ligation    . Breast lum[facility]  09/2009    [facility]  . Lumbar disc surgery      [facility]  . Tonsillectomy and adenoidectomy  1960  . Cesarean section    . Laminectomy Right 07/18/2014    Procedure: Right Lumbar Four to Five Laminectomy for Synovial cyst;  Surgeon: Kristeen Miss, MD;  Location: [place] [facility];  Service: Neurosurgery;  Laterality: Right;  Right L4-5 Laminectomy for Synovial cyst   Family History  Problem Relation Age of Onset  . Liver disease Mother     d/c at 2, acute liver failure  . COPD Father     d/c at 58  .  Heart attack Brother 60  . Stroke Brother 48    same brother  . Alcohol abuse Brother     other brother   Social History  Substance Use Topics  . Smoking status: Former Smoker    Quit date: 09/02/2005  . Smokeless tobacco: None  . Alcohol Use: No    ROS Constitutional: Denies fever, chills, weight loss/gain, headaches, insomnia,  night sweats, and change in appetite. Does c/o fatigue. Eyes: Denies redness, blurred vision, diplopia, discharge, itchy, watery eyes.  ENT: Denies discharge, congestion, post nasal drip, epistaxis, sore throat, earache, hearing loss, dental pain,  Tinnitus, Vertigo, Sinus pain, snoring.  Cardio: Denies chest pain, palpitations, irregular heartbeat, syncope, dyspnea, diaphoresis, orthopnea, PND, claudication, edema Respiratory: denies cough, dyspnea, DOE, pleurisy, hoarseness, laryngitis, wheezing.  Gastrointestinal: Denies dysphagia, heartburn, reflux, water brash, pain, cramps, nausea, vomiting, bloating, diarrhea, constipation, hematemesis, melena, hematochezia, jaundice, hemorrhoids Genitourinary: Denies dysuria, frequency, urgency, nocturia, hesitancy, discharge, hematuria, flank pain Breast: Breast lumps, nipple discharge, bleeding.  Musculoskeletal: Denies arthralgia, myalgia, stiffness, Jt. Swelling, pain, limp, and strain/sprain. Denies falls. Skin: Denies puritis, rash, hives, warts, acne, eczema, changing in skin lesion Neuro: No weakness, tremor, incoordination, spasms, paresthesia, pain Psychiatric: Denies confusion, memory loss, sensory loss. Denies Depression. Endocrine: Denies change in weight, skin, hair change, nocturia, and paresthesia, diabetic polys, visual blurring, hyper / hypo glycemic episodes.  Heme/Lymph: No excessive bleeding, bruising, enlarged lymph nodes.  Physical Exam  BP 142/84 mmHg  Pulse 76  Temp(Src) 97.8 F (36.6 C)  Resp 16  Ht 5\' 2"  (1.575 m)  Wt 158 lb 6.4 oz (71.85 kg)  BMI 28.96 kg/m2  General Appearance: Well nourished and in no apparent distress. Eyes: PERRLA, EOMs, conjunctiva no swelling or erythema, normal fundi and vessels. Sinuses: No frontal/maxillary tenderness ENT/Mouth: EACs patent / TMs  nl. Nares clear without erythema, swelling, mucoid exudates. Oral hygiene is good. No erythema, swelling, or exudate. Tongue normal, non-obstructing. Tonsils not swollen or erythematous. Hearing normal.  Neck: Supple, thyroid normal. No bruits, nodes or JVD. Respiratory: Respiratory effort normal.  BS equal and clear bilateral without rales, rhonci, wheezing or stridor. Cardio: Heart sounds  are normal with regular rate and rhythm and no murmurs, rubs or gallops. Peripheral pulses are normal and equal bilaterally without edema. No aortic or femoral bruits. Chest: symmetric with normal excursions and percussion. Breasts: Symmetric, without lumps, nipple discharge, retractions, or fibrocystic changes.  Abdomen: Flat, soft, with bowl sounds. Nontender, no guarding, rebound, hernias, masses, or organomegaly.  Lymphatics: Non tender without lymphadenopathy.  Musculoskeletal: Full ROM all peripheral extremities, joint stability, 5/5 strength, and normal gait. Skin: Warm and dry without rashes, lesions, cyanosis, clubbing or  ecchymosis.  Neuro: Cranial nerves intact, reflexes equal bilaterally. Normal muscle tone, no cerebellar symptoms. Sensation intact.  Pysch: Alert and oriented X 3, normal affect, Insight and Judgment appropriate.   Assessment and Plan  1. Annual Preventative Screening Examination  - Microalbumin / creatinine urine ratio - EKG 12-Lead - [place], RETROPERITNL ABD,  LTD - POC Hemoccult Bld/Stl  - Urinalysis, Routine w reflex microscopic  - CBC with Differential/Platelet - BASIC METABOLIC PANEL WITH GFR - Hepatic function panel - Magnesium - Lipid panel - TSH - Hemoglobin A1c - Insulin, random - VITAMIN D 25 Hydroxy   2. Essential hypertension  - Microalbumin / creatinine urine ratio - EKG 12-Lead - [place], RETROPERITNL ABD,  LTD - TSH  3. Hyperlipidemia  - Lipid panel - TSH  4. Prediabetes  - Hemoglobin A1c - Insulin,  random  5. Vitamin D deficiency  - VITAMIN D 25 Hydroxy  6. Screening for rectal cancer  - POC Hemoccult Bld/Stl   7. Medication management  - Urinalysis, Routine w reflex microscopic  - CBC with Differential/Platelet - BASIC METABOLIC PANEL WITH GFR - Hepatic function panel - Magnesium   Continue prudent diet as discussed, weight control, BP monitoring, regular exercise, and medications. Discussed med's effects and SE's.  Screening labs and tests as requested with regular follow-up as recommended. Over 40 minutes of exam, counseling, chart review and high complex critical decision making was performed.

## 2015-11-08 LAB — URINALYSIS, ROUTINE W REFLEX MICROSCOPIC
Bilirubin Urine: NEGATIVE
Glucose, UA: NEGATIVE
Hgb urine dipstick: NEGATIVE
Ketones, ur: NEGATIVE
Leukocytes, UA: NEGATIVE
Nitrite: NEGATIVE
Protein, ur: NEGATIVE
Specific Gravity, Urine: 1.015 (ref 1.001–1.035)
pH: 5.5 (ref 5.0–8.0)

## 2015-11-08 LAB — MICROALBUMIN / CREATININE URINE RATIO
Creatinine, Urine: 133 mg/dL (ref 20–320)
Microalb Creat Ratio: 3 mcg/mg creat (ref <30)
Microalb, Ur: 0.4 mg/dL

## 2015-11-08 LAB — INSULIN, RANDOM: Insulin: 25 u[IU]/mL — ABNORMAL HIGH (ref 2.0–19.6)

## 2015-11-08 LAB — VITAMIN D 25 HYDROXY (VIT D DEFICIENCY, FRACTURES): Vit D, 25-Hydroxy: 90 ng/mL (ref 30–100)

## 2015-11-08 LAB — HEMOGLOBIN A1C
Hgb A1c MFr Bld: 5.9 % — ABNORMAL HIGH (ref <5.7)
Mean Plasma Glucose: 123 mg/dL — ABNORMAL HIGH (ref <117)

## 2015-11-16 DIAGNOSIS — M8580 Other specified disorders of bone density and structure, unspecified site: Secondary | ICD-10-CM | POA: Insufficient documentation

## 2016-01-10 ENCOUNTER — Encounter: Payer: Self-pay | Admitting: Internal Medicine

## 2016-01-10 ENCOUNTER — Ambulatory Visit (INDEPENDENT_AMBULATORY_CARE_PROVIDER_SITE_OTHER): Payer: Medicare Other | Admitting: Internal Medicine

## 2016-01-10 VITALS — BP 108/66 | HR 88 | Temp 97.5°F | Resp 16 | Ht 62.0 in | Wt 161.0 lb

## 2016-01-10 DIAGNOSIS — L723 Sebaceous cyst: Secondary | ICD-10-CM | POA: Diagnosis not present

## 2016-01-10 NOTE — Progress Notes (Signed)
  Subjective:    Patient ID: Regina Shaw, female    DOB: 09/12/1950, 65 y.o.   MRN: [id number]  HPI  Patient presents for evaluation of a white lesion of her Left cheek that has been increasing in size.   Medication Sig  . aspirin EC 81 MG tablet Take 81 mg by mouth daily.  . cholecalciferol (VITAMIN D) 1000 UNITS tablet Take 5,000 Units by mouth 2 (two) times daily.   . Multiple Vitamin (MULTIVITAMIN) tablet Take 1 tablet by mouth daily.  . Omega-3 Fatty Acids (FISH OIL PO) Take 2,000 mg by mouth daily.   No facility-administered medications prior to visit.   Allergies  Allergen Reactions  . Red Yeast Rice [Cholestin]     Dizzy  . Tape Other (See Comments)    Pt states that skin burns, and it hurts severely.  . Darvon [Propoxyphene] Nausea And Vomiting and Anxiety   Past Medical History  Diagnosis Date  . DCIS (ductal carcinoma in situ) of breast 04/29/2012  . Hyperlipidemia   . Vitamin D deficiency   . Cancer [facility])    Past Surgical History  Procedure Laterality Date  . Tubal ligation    . Breast lumpectomy  09/2009    [facility]  . Lumbar disc surgery      [facility]  . Tonsillectomy and adenoidectomy  1960  . Cesarean section    . Laminectomy Right 07/18/2014    Procedure: Right Lumbar Four to Five Laminectomy for Synovial cyst;  Surgeon: Kristeen Miss, MD;  Location: [place] [facility];  Service: Neurosurgery;  Laterality: Right;  Right L4-5 Laminectomy for Synovial cyst    Review of Systems  10 point systems review negative except as above.    Objective:   Physical Exam  BP 108/66 mmHg  Pulse 88  Temp(Src) 97.5 F (36.4 C)  Resp 16  Ht 5\' 2"  (1.575 m)  Wt 161 lb (73.029 kg)  BMI 29.44 kg/m2  There is a cystic 4 x 5 mm subcutaneous movable mass in the L cheek area over the zugoma.   Procedure (CPT: X1222033)   After informed consent and aseptic prep and local anesthesia with 0.5 ml  0.5% Marcaine w/epi, the area was sharply incised  With a #10 scalpel and  a pearly white cystic structure was sharply dissected free and delivered. The wound cavity was inspected and free of reminant cysts and the wound edges were approximated with  Steri- strips. Patient was instructed in post-op wound care.     Assessment & Plan:   1. Sebaceous cyst, face   - Excised

## 2016-02-15 ENCOUNTER — Ambulatory Visit: Payer: Self-pay | Admitting: Internal Medicine

## 2016-06-04 ENCOUNTER — Ambulatory Visit (INDEPENDENT_AMBULATORY_CARE_PROVIDER_SITE_OTHER): Payer: Medicare Other | Admitting: *Deleted

## 2016-06-04 DIAGNOSIS — Z23 Encounter for immunization: Secondary | ICD-10-CM

## 2016-08-07 ENCOUNTER — Ambulatory Visit (INDEPENDENT_AMBULATORY_CARE_PROVIDER_SITE_OTHER): Payer: Medicare Other | Admitting: Internal Medicine

## 2016-08-07 ENCOUNTER — Encounter: Payer: Self-pay | Admitting: Internal Medicine

## 2016-08-07 VITALS — BP 120/64 | HR 70 | Temp 98.2°F | Resp 16 | Ht 62.0 in | Wt 161.0 lb

## 2016-08-07 DIAGNOSIS — R7303 Prediabetes: Secondary | ICD-10-CM

## 2016-08-07 DIAGNOSIS — Z0001 Encounter for general adult medical examination with abnormal findings: Secondary | ICD-10-CM | POA: Diagnosis not present

## 2016-08-07 DIAGNOSIS — F329 Major depressive disorder, single episode, unspecified: Secondary | ICD-10-CM

## 2016-08-07 DIAGNOSIS — E782 Mixed hyperlipidemia: Secondary | ICD-10-CM | POA: Diagnosis not present

## 2016-08-07 DIAGNOSIS — J069 Acute upper respiratory infection, unspecified: Secondary | ICD-10-CM | POA: Diagnosis not present

## 2016-08-07 DIAGNOSIS — M4310 Spondylolisthesis, site unspecified: Secondary | ICD-10-CM

## 2016-08-07 DIAGNOSIS — M7061 Trochanteric bursitis, right hip: Secondary | ICD-10-CM | POA: Diagnosis not present

## 2016-08-07 DIAGNOSIS — M48061 Spinal stenosis, lumbar region without neurogenic claudication: Secondary | ICD-10-CM

## 2016-08-07 DIAGNOSIS — M5441 Lumbago with sciatica, right side: Secondary | ICD-10-CM | POA: Diagnosis not present

## 2016-08-07 DIAGNOSIS — E559 Vitamin D deficiency, unspecified: Secondary | ICD-10-CM

## 2016-08-07 DIAGNOSIS — Z79899 Other long term (current) drug therapy: Secondary | ICD-10-CM | POA: Diagnosis not present

## 2016-08-07 DIAGNOSIS — I10 Essential (primary) hypertension: Secondary | ICD-10-CM | POA: Diagnosis not present

## 2016-08-07 DIAGNOSIS — Z0000 Encounter for general adult medical examination without abnormal findings: Secondary | ICD-10-CM

## 2016-08-07 DIAGNOSIS — D0510 Intraductal carcinoma in situ of unspecified breast: Secondary | ICD-10-CM

## 2016-08-07 DIAGNOSIS — R6889 Other general symptoms and signs: Secondary | ICD-10-CM

## 2016-08-07 DIAGNOSIS — L710 Perioral dermatitis: Secondary | ICD-10-CM

## 2016-08-07 DIAGNOSIS — Z1212 Encounter for screening for malignant neoplasm of rectum: Secondary | ICD-10-CM

## 2016-08-07 DIAGNOSIS — F32A Depression, unspecified: Secondary | ICD-10-CM

## 2016-08-07 MED ORDER — AZITHROMYCIN 250 MG PO TABS: ORAL_TABLET | ORAL | 0 refills | Status: DC

## 2016-08-07 MED ORDER — PREDNISONE 20 MG PO TABS: ORAL_TABLET | ORAL | 0 refills | Status: DC

## 2016-08-07 MED ORDER — FLUTICASONE PROPIONATE 50 MCG/ACT NA SUSP: 2.0000 | Freq: Every day | NASAL | 0 refills | Status: DC

## 2016-08-07 NOTE — Patient Instructions (Signed)
Please start taking prednisone in the morning with your breakfast.  Please complete the course.   Please take 25-50 mg of benadryl at bedtime to help with both itching and nasal congestion.  Please use flonase 2 sprays per nostril each evening before bedtime to reduce congestion and sinus pressure.  Please take claritin, zyrtec or allegra nightly.  You can use hydrocortisone cream 2-3 times per day to help with rash and itching.  Please use eucrisa before bedtime to help with the rash on your face.  Small thin layer.    Please call the office if the rash does not improve.  Please change back to your original tooth paste.

## 2016-08-07 NOTE — Progress Notes (Signed)
MEDICARE ANNUAL WELLNESS VISIT AND FOLLOW UP  Assessment:    1. Acute URI  - predniSONE (DELTASONE) 20 MG tablet; 3 tabs po daily x 3 days, then 2 tabs x 3 days, then 1.5 tabs x 3 days, then 1 tab x 3 days, then 0.5 tabs x 3 days  Dispense: 27 tablet; Refill: 0 - azithromycin (ZITHROMAX Z-PAK) 250 MG tablet; 2 po day one, then 1 daily x 4 days  Dispense: 6 tablet; Refill: 0 - fluticasone (FLONASE) 50 MCG/ACT nasal spray; Place 2 sprays into both nostrils daily.  Dispense: 16 g; Refill: 0  2. Perioral dermatitis -hydrocortisone cream OTC -stop toothpaste  - predniSONE (DELTASONE) 20 MG tablet; 3 tabs po daily x 3 days, then 2 tabs x 3 days, then 1.5 tabs x 3 days, then 1 tab x 3 days, then 0.5 tabs x 3 days  Dispense: 27 tablet; Refill: 0  3. Essential hypertension -well controlled -dash diet -exercise as tolerated  4. Degenerative spondylolisthesis -follows with Dr. Ellene Route  5. Trochanteric bursitis of right hip (Mar 2016) -followed by ortho -resolved  6. Ductal carcinoma in situ (DCIS) of breast, unspecified laterality -follows with oncology and general surgery  7. Depression, controlled -not currently on meds  8. Mixed hyperlipidemia -cont diet and exercise  9. Right-sided low back pain with right-sided sciatica, unspecified chronicity -followed by Dr. Ellene Route  10. Spinal stenosis of lumbar region without neurogenic claudication -followed by Dr. Ellene Route  11. Medication management -due at next regular visit  12. Prediabetes -well controlled  13. Screening for rectal cancer -done yearly -cologuard last year  65. Vitamin D deficiency -cont VIt D   Over 30 minutes of exam, counseling, chart review, and critical decision making was performed  Future Appointments Date Time Provider Aristes  11/11/2016 2:00 PM Unk Pinto, MD [id number] None    Plan:   During the course of the visit the patient was educated and counseled about appropriate  screening and preventive services including:    Pneumococcal vaccine   Influenza vaccine  Td vaccine  Prevnar 13  Screening electrocardiogram  Screening mammography  Bone densitometry screening  Colorectal cancer screening  Diabetes screening  Glaucoma screening  Nutrition counseling   Advanced directives: given info/requested copies   Subjective:   Regina Shaw is a 65 y.o. female who presents for Medicare Annual Wellness Visit and 3 month follow up on hypertension, prediabetes, hyperlipidemia, vitamin D def.   Her blood pressure has been controlled at home, today their BP is BP: 120/64 She does not workout. She denies chest pain, shortness of breath, dizziness.  She is not on cholesterol medication and denies myalgias. Her cholesterol is at goal. The cholesterol last visit was:  Lab Results  Component Value Date   CHOL 223 (H) 11/07/2015   HDL 72 11/07/2015   LDLCALC 125 11/07/2015   LDLDIRECT 139.2 04/29/2012   TRIG 130 11/07/2015   CHOLHDL 3.1 11/07/2015   She has been working on diet and exercise for prediabetes, and denies foot ulcerations, hyperglycemia, hypoglycemia , increased appetite, nausea, paresthesia of the feet, polydipsia, polyuria, visual disturbances, vomiting and weight loss. Last A1C in the office was: Lab Results  Component Value Date   HGBA1C 5.9 (H) 11/07/2015   Last GFR Lab Results  Component Value Date   GFRNONAA 76 11/07/2015   Lab Results  Component Value Date   GFRAA 88 11/07/2015   Patient is on Vitamin D supplement. Lab Results  Component Value Date  VD25OH 90 11/07/2015     She has been having a rash for the past couple weeks.  She reports that it is mostly concentrated around her mouth.  It is dry and itchy.  It has woken her up from sleep.  She has not changed, soaps lotions, detergents, or personal products other than a new tooth paste a couple weeks ago.    She has also had sinus congestion, sore throat, right ear  pain, and dry cough x 1 week.     Medication Review Current Outpatient Prescriptions on File Prior to Visit  Medication Sig Dispense Refill  . aspirin EC 81 MG tablet Take 81 mg by mouth daily.    . cholecalciferol (VITAMIN D) 1000 UNITS tablet Take 5,000 Units by mouth 2 (two) times daily.     . Multiple Vitamin (MULTIVITAMIN) tablet Take 1 tablet by mouth daily.    . Omega-3 Fatty Acids (FISH OIL PO) Take 2,000 mg by mouth daily.     No current facility-administered medications on file prior to visit.     Allergies: Allergies  Allergen Reactions  . Red Yeast Rice [Cholestin]     Dizzy  . Tape Other (See Comments)    Pt states that skin burns, and it hurts severely.  . Darvon [Propoxyphene] Nausea And Vomiting and Anxiety    Current Problems (verified) has DCIS (ductal carcinoma in situ) of breast; Hyperlipidemia; Essential hypertension; Prediabetes; Vitamin D deficiency; Medication management; Depression, controlled; Lumbago with Right Sciatica; Trochanteric bursitis of right hip (Mar 2016); Lumbar canal stenosis; Degenerative spondylolisthesis; and Screening for rectal cancer on her problem list.  Screening Tests Immunization History  Administered Date(s) Administered  . Influenza, High Dose Seasonal PF 05/05/2014, 06/15/2015, 06/04/2016  . PPD Test 10/18/2013  . Pneumococcal Conjugate-13 05/05/2014  . Pneumococcal Polysaccharide-23 10/18/2014  . Tdap 10/18/2014  . Zoster 11/06/2009    Preventative care: Last colonoscopy: 2006 Last mammogram: 1/17 DEXA:2010  Names of Other Physician/Practitioners you currently use: 1. [facility]- here for primary care 2. Dr. Gwynn Burly, eye doctor, last visit 2017 3. Dr. Mariea Clonts, dentist, last [phone] Patient Care Team: Unk Pinto, MD as PCP - General (Internal Medicine) Ladene Artist, MD as Consulting Physician (Gastroenterology) Rozetta Nunnery, MD as Consulting Physician  (Otolaryngology) Susa Day, MD as Consulting Physician (Orthopedic Surgery)  Surgical: She  has a past surgical history that includes Tubal ligation; Breast lumpectomy (09/2009); Lumbar disc surgery; Tonsillectomy and adenoidectomy (1960); Cesarean section; and Laminectomy (Right, 07/18/2014). Family Her family history includes Alcohol abuse in her brother; COPD in her father; Heart attack (age of onset: 35) in her brother; Liver disease in her mother; Stroke (age of onset: 72) in her brother. Social history  She reports that she quit smoking about 10 years ago. She does not have any smokeless tobacco history on file. She reports that she does not drink alcohol or use drugs.  MEDICARE WELLNESS OBJECTIVES: Physical activity:   Cardiac risk factors:   Depression/mood screen:   Depression screen [facility] 2/9 11/07/2015  Decreased Interest 0  Down, Depressed, Hopeless 0  PHQ - 2 Score 0    ADLs:  In your present state of health, do you have any difficulty performing the following activities: 11/07/2015  Hearing? N  Vision? N  Difficulty concentrating or making decisions? N  Walking or climbing stairs? N  Dressing or bathing? N  Doing errands, shopping? N  Some recent data might be hidden     Cognitive Testing  Alert? Yes  Normal Appearance?Yes  Oriented to person? Yes  Place? Yes   Time? Yes  Recall of three objects?  Yes  Can perform simple calculations? Yes  Displays appropriate judgment?Yes  Can read the correct time from a watch face?Yes  EOL planning:     Objective:   Today's Vitals   08/07/16 1559  BP: 120/64  Pulse: 70  Resp: 16  Temp: 98.2 F (36.8 C)  TempSrc: Temporal  Weight: 161 lb (73 kg)  Height: 5\' 2"  (1.575 m)   Body mass index is 29.45 kg/m.  General appearance: alert, no distress, WD/WN,  female HEENT: normocephalic, sclerae anicteric, TMs pearly, nares patent, no discharge or erythema, pharynx normal Oral cavity: MMM, no lesions Neck: supple, no  lymphadenopathy, no thyromegaly, no masses Heart: RRR, normal S1, S2, no murmurs Lungs: CTA bilaterally, no wheezes, rhonchi, or rales Abdomen: +bs, soft, non tender, non distended, no masses, no hepatomegaly, no splenomegaly Musculoskeletal: nontender, no swelling, no obvious deformity Extremities: no edema, no cyanosis, no clubbing Pulses: 2+ symmetric, upper and lower extremities, normal cap refill Neurological: alert, oriented x 3, CN2-12 intact, strength normal upper extremities and lower extremities, sensation normal throughout, DTRs 2+ throughout, no cerebellar signs, gait normal Psychiatric: normal affect, behavior normal, pleasant

## 2016-10-29 ENCOUNTER — Encounter: Payer: Self-pay | Admitting: Internal Medicine

## 2016-10-29 ENCOUNTER — Ambulatory Visit (INDEPENDENT_AMBULATORY_CARE_PROVIDER_SITE_OTHER): Payer: Medicare Other | Admitting: Internal Medicine

## 2016-10-29 VITALS — BP 110/68 | HR 92 | Temp 97.3°F | Resp 16 | Ht 62.0 in | Wt 163.6 lb

## 2016-10-29 DIAGNOSIS — L243 Irritant contact dermatitis due to cosmetics: Secondary | ICD-10-CM

## 2016-10-29 MED ORDER — MOMETASONE FUROATE 0.1 % EX CREA: TOPICAL_CREAM | CUTANEOUS | 1 refills | Status: DC

## 2016-10-29 NOTE — Patient Instructions (Signed)
Contact Dermatitis Dermatitis is redness, soreness, and swelling (inflammation) of the skin. Contact dermatitis is a reaction to certain substances that touch the skin. There are two types of contact dermatitis:  Irritant contact dermatitis. This type is caused by something that irritates your skin, such as dry hands from washing them too much. This type does not require previous exposure to the substance for a reaction to occur. This type is more common.  Allergic contact dermatitis. This type is caused by a substance that you are allergic to, such as a nickel allergy or poison ivy. This type only occurs if you have been exposed to the substance (allergen) before. Upon a repeat exposure, your body reacts to the substance. This type is less common. What are the causes? Many different substances can cause contact dermatitis. Irritant contact dermatitis is most commonly caused by exposure to:  Makeup.  Soaps.  Detergents.  Bleaches.  Acids.  Metal salts, such as nickel. Allergic contact dermatitis is most commonly caused by exposure to:  Poisonous plants.  Chemicals.  Jewelry.  Latex.  Medicines.  Preservatives in products, such as clothing. What increases the risk? This condition is more likely to develop in:  People who have jobs that expose them to irritants or allergens.  People who have certain medical conditions, such as asthma or eczema. What are the signs or symptoms? Symptoms of this condition may occur anywhere on your body where the irritant has touched you or is touched by you. Symptoms include:  Dryness or flaking.  Redness.  Cracks.  Itching.  Pain or a burning feeling.  Blisters.  Drainage of small amounts of blood or clear fluid from skin cracks. With allergic contact dermatitis, there may also be swelling in areas such as the eyelids, mouth, or genitals. How is this diagnosed? This condition is diagnosed with a medical history and physical exam.  A patch skin test may be performed to help determine the cause. If the condition is related to your job, you may need to see an occupational medicine specialist. How is this treated? Treatment for this condition includes figuring out what caused the reaction and protecting your skin from further contact. Treatment may also include:  Steroid creams or ointments. Oral steroid medicines may be needed in more severe cases.  Antibiotics or antibacterial ointments, if a skin infection is present.  Antihistamine lotion or an antihistamine taken by mouth to ease itching.  A bandage (dressing). Follow these instructions at home: Skin Care   Moisturize your skin as needed.  Apply cool compresses to the affected areas.  Try taking a bath with:  Epsom salts. Follow the instructions on the packaging. You can get these at your local pharmacy or grocery store.  Baking soda. Pour a small amount into the bath as directed by your health care provider.  Colloidal oatmeal. Follow the instructions on the packaging. You can get this at your local pharmacy or grocery store.  Try applying baking soda paste to your skin. Stir water into baking soda until it reaches a paste-like consistency.  Do not scratch your skin.  Bathe less frequently, such as every other day.  Bathe in lukewarm water. Avoid using hot water. Medicines   Take or apply over-the-counter and prescription medicines only as told by your health care provider.  If you were prescribed an antibiotic medicine, take or apply your antibiotic as told by your health care provider. Do not stop using the antibiotic even if your condition starts to improve. General   instructions   Keep all follow-up visits as told by your health care provider. This is important.  Avoid the substance that caused your reaction. If you do not know what caused it, keep a journal to try to track what caused it. Write down:  What you eat.  What cosmetic products  you use.  What you drink.  What you wear in the affected area. This includes jewelry.  If you were given a dressing, take care of it as told by your health care provider. This includes when to change and remove it. Contact a health care provider if:  Your condition does not improve with treatment.  Your condition gets worse.  You have signs of infection such as swelling, tenderness, redness, soreness, or warmth in the affected area.  You have a fever.  You have new symptoms. Get help right away if:  You have a severe headache, neck pain, or neck stiffness.  You vomit.  You feel very sleepy.  You notice red streaks coming from the affected area.  Your bone or joint underneath the affected area becomes painful after the skin has healed.  The affected area turns darker.  You have difficulty breathing. This information is not intended to replace advice given to you by your health care provider. Make sure you discuss any questions you have with your health care provider. Document Released: 08/16/2000 Document Revised: 01/25/2016 Document Reviewed: 01/04/2015 Elsevier Interactive Patient Education  2017 Elsevier Inc.  

## 2016-10-29 NOTE — Progress Notes (Signed)
  Subjective:    Patient ID: Regina Shaw, female    DOB: Aug 30, 1951, 66 y.o.   MRN: [id number]  HPI   Patient presents with a 3-4 week hx/o rash of the NL fold chin and neck and is located in the area of her daily cosmetic that she has used for years. She was seen by a Dermatologist and treated with a HC 2.5% cream & prednisone taper and continued to use her cosmetics and the rash has persisted.  No other med or contact issues are identified.   Medication Sig  . aspirin EC 81 MG tablet Take  daily.  Marland Kitchen VITAMIN D  5,000 Units Take 2  times daily.   . Multiple Vitamin  Take 1 tab daily.  . Omega-3 FISH OIL Take 2,000 mg  daily.  Marland Kitchen FLONASE  nasal spray 2 sprays into nostrils daily.   Allergies  Allergen Reactions  . Red Yeast Rice [Cholestin]     Dizzy  . Tape Other (See Comments)    Pt states that skin burns, and it hurts severely.  . Darvon [Propoxyphene] Nausea And Vomiting and Anxiety   Past Medical History:  Diagnosis Date  . Cancer ([place])   . DCIS (ductal carcinoma in situ) of breast 04/29/2012  . Hyperlipidemia   . Vitamin D deficiency    Review of Systems  10 point systems review negative except as above.    Objective:   Physical Exam  BP 110/68   Pulse 92   Temp 97.3 F (36.3 C)   Resp 16   Ht 5\' 2"  (1.575 m)   Wt 163 lb 9.6 oz (74.2 kg)   BMI 29.92 kg/m   Exam of the skin finds an eczematous erythematous dry scaly/flaky rash over the nose , NL folds, chin & ant neck. No ulcerations or vessiculation or signs of infection is evident      Assessment & Plan:    1. Irritant contact dermatitis due to cosmetics  - advised stop ALL facial cosmetics/soaps til rash resolves   - Rx Elocon 0.1% crm (45 gms) - apply 2 x / day.   Recc Cetafil lotion

## 2016-11-11 ENCOUNTER — Encounter: Payer: Self-pay | Admitting: Internal Medicine

## 2016-11-11 NOTE — Progress Notes (Signed)
[facility] Unk Pinto, M.D.    Uvaldo Bristle. Silverio Lay, P.A.-C      Starlyn Skeans, P.A.-C  [facility]                [address] [place] [address] Telephone [phone] Telefax [phone]  Annual Screening/Preventative Visit & Comprehensive Evaluation &  Examination     This very nice 66 y.o.  [facility] presents for a Screening/Preventative Visit & comprehensive evaluation and management of multiple medical co-morbidities.  Patient has been followed for HTN, T2_NIDDM  Prediabetes, Hyperlipidemia and Vitamin D Deficiency.     In 2011 Patient was dx'd with a L breast CIS tx'd by lumpectomy and underwent Radiation and has her annual f/u [facility] at [facility] in [place].       Patient has hx/o labile HTN monitored expectantly predates since 2009. Patient's BP has been controlled at home and patient denies any cardiac symptoms as chest pain, palpitations, shortness of breath, dizziness or ankle swelling. Today's BP is borderline elevated - 140/72 and rechecked at 163/90.      Patient's hyperlipidemia is controlled with diet and medications. Patient denies myalgias or other medication SE's. Last lipids were not at goal:  Lab Results  Component Value Date   CHOL 223 (H) 11/07/2015   HDL 72 11/07/2015   LDLCALC 125 11/07/2015   TRIG 130 11/07/2015   CHOLHDL 3.1 11/07/2015      Patient has prediabetes (A1c 7.3% in 2011) and patient denies reactive hypoglycemic symptoms, visual blurring, diabetic polys, or paresthesias. Last A1c was still not at at goal: Lab Results  Component Value Date   HGBA1C 5.9 (H) 11/07/2015      Finally, patient has history of Vitamin D Deficiency ("24" in 2008) and last Vitamin D was at goal: Lab Results  Component Value Date   VD25OH 90 11/07/2015   Current Outpatient Prescriptions on File Prior to Visit  Medication Sig  . aspirin EC 81 MG  Take   daily.  Marland Kitchen VITAMIN D  5,000 Units Take   2 x daily.   . mometasone (ELOCON) 0.1 % cream Apply to affected area daily  . Multiple Vitamin  Take 1 tab daily.  . Omega-3 FISH OIL Take 2,000 mg  daily.   Allergies  Allergen Reactions  . Red Yeast Rice [Cholestin] Dizzy  . Tape skin burns, and it hurts severely  . Darvon [Propoxyphene] Nausea And Vomiting and Anxiety   Past Medical History:  Diagnosis Date  . Cancer ([place])   . DCIS (ductal carcinoma in situ) of breast 04/29/2012  . Hyperlipidemia   . Vitamin D deficiency    Health Maintenance  Topic Date Due  . MAMMOGRAM  08/02/2013  . TETANUS/TDAP  10/18/2024  . COLONOSCOPY  11/15/2024  . INFLUENZA VACCINE  Completed  . DEXA SCAN  Completed  . Hepatitis C Screening  Completed  . PNA vac Low Risk Adult  Completed   Immunization History  Administered Date(s) Administered  . Influenza, High Dose Seasonal PF 05/05/2014, 06/15/2015, 06/04/2016  . PPD Test 10/18/2013  . Pneumococcal Conjugate-13 05/05/2014  . Pneumococcal Polysaccharide-23 10/18/2014  . Tdap 10/18/2014  . Zoster 11/06/2009   Past Surgical History:  Procedure Laterality Date  . BREAST LUMPECTOMY  09/2009   [place]    . LAMINECTOMY Right 07/18/2014   Procedure:  Right Lumbar Four to Five Laminectomy for Synovial cyst;  Surgeon: Kristeen Miss, MD;  Location: [facility];  Service: Neurosurgery;  Laterality: Right;  Right L4-5 Laminectomy for Synovial cyst  . LUMBAR [facility]  . TONSILLECTOMY AND ADENOIDECTOMY  1960  . TUBAL LIGATION     Family History  Problem Relation Age of Onset  . Liver disease Mother     d/c at 59, acute liver failure  . COPD Father     d/c at 70  . Heart attack Brother 66  . Stroke Brother 8    same brother  . Alcohol abuse Brother     other brother   Social History  Substance Use Topics  . Smoking status: Former Smoker    Quit date: 09/02/2005  . Smokeless tobacco: Never Used  .  Alcohol use No    ROS Constitutional: Denies fever, chills, weight loss/gain, headaches, insomnia,  night sweats, and change in appetite. Does c/o fatigue. Eyes: Denies redness, blurred vision, diplopia, discharge, itchy, watery eyes.  ENT: Denies discharge, congestion, post nasal drip, epistaxis, sore throat, earache, hearing loss, dental pain, Tinnitus, Vertigo, Sinus pain, snoring.  Cardio: Denies chest pain, palpitations, irregular heartbeat, syncope, dyspnea, diaphoresis, orthopnea, PND, claudication, edema Respiratory: denies cough, dyspnea, DOE, pleurisy, hoarseness, laryngitis, wheezing.  Gastrointestinal: Denies dysphagia, heartburn, reflux, water brash, pain, cramps, nausea, vomiting, bloating, diarrhea, constipation, hematemesis, melena, hematochezia, jaundice, hemorrhoids Genitourinary: Denies dysuria, frequency, urgency, nocturia, hesitancy, discharge, hematuria, flank pain Breast: Breast lumps, nipple discharge, bleeding.  Musculoskeletal: Denies arthralgia, myalgia, stiffness, Jt. Swelling, pain, limp, and strain/sprain. Denies falls. Skin: Denies puritis, rash, hives, warts, acne, eczema, changing in skin lesion Neuro: No weakness, tremor, incoordination, spasms, paresthesia, pain Psychiatric: Denies confusion, memory loss, sensory loss. Denies Depression. Endocrine: Denies change in weight, skin, hair change, nocturia, and paresthesia, diabetic polys, visual blurring, hyper / hypo glycemic episodes.  Heme/Lymph: No excessive bleeding, bruising, enlarged lymph nodes.  Physical Exam  BP 140/72   Pulse 84   Temp 97.4 F (36.3 C)   Resp 16   Ht 5\' 2"  (1.575 m)   Wt 161 lb (73 kg)   BMI 29.45 kg/m   General Appearance: Well nourished and in no apparent distress.  Eyes: PERRLA, EOMs, conjunctiva no swelling or erythema, normal fundi and vessels. Sinuses: No frontal/maxillary tenderness ENT/Mouth: EACs patent / TMs  nl. Nares clear without erythema, swelling, mucoid  exudates. Oral hygiene is good. No erythema, swelling, or exudate. Tongue normal, non-obstructing. Tonsils not swollen or erythematous. Hearing normal.  Neck: Supple, thyroid normal. No bruits, nodes or JVD. Respiratory: Respiratory effort normal.  BS equal and clear bilateral without rales, rhonci, wheezing or stridor. Cardio: Heart sounds are normal with regular rate and rhythm and no murmurs, rubs or gallops. Peripheral pulses are normal and equal bilaterally without edema. No aortic or femoral bruits. Chest: symmetric with normal excursions and percussion. Breasts: Symmetric, without lumps, nipple discharge, retractions, or fibrocystic changes.  Abdomen: Flat, soft with bowel sounds active. Nontender, no guarding, rebound, hernias, masses, or organomegaly.  Lymphatics: Non tender without lymphadenopathy.  Genitourinary:  Musculoskeletal: Full ROM all peripheral extremities, joint stability, 5/5 strength, and normal gait. Skin: Warm and dry without rashes, lesions, cyanosis, clubbing or  ecchymosis.  Neuro: Cranial nerves intact, reflexes equal bilaterally. Normal muscle tone, no cerebellar symptoms. Sensation intact.  Pysch: Alert and oriented X 3, normal affect, Insight and Judgment appropriate.   Assessment and Plan  1. Annual Preventative  Screening Examination   2. Essential hypertension  - Advised monitor BP 2 x/day and call if remains elevated  - Microalbumin / creatinine urine ratio - EKG 12-Lead - Urinalysis, Routine w reflex microscopic - CBC with Differential/Platelet - BASIC METABOLIC PANEL WITH GFR - Magnesium - TSH  3. Mixed hyperlipidemia  - Hepatic function panel - Lipid panel - TSH  4. Prediabetes  - Hemoglobin A1c - Insulin, random  5. Vitamin D deficiency  - VITAMIN D 25 Hydroxy  6. Screening for rectal cancer  - POC Hemoccult Bld/Stl   7. Screening for ischemic heart disease   8. Medication management  - Urinalysis, Routine w reflex  microscopic - CBC with Differential/Platelet - BASIC METABOLIC PANEL WITH GFR - Hepatic function panel - Magnesium - Lipid panel - TSH - Hemoglobin A1c - Insulin, random - VITAMIN D 25 Hydroxy        Continue prudent diet as discussed, weight control, BP monitoring, regular exercise, and medications. Discussed med's effects and SE's. Screening labs and tests as requested with regular follow-up as recommended. Over 40 minutes of exam, counseling, chart review and high complex critical decision making was performed.

## 2016-11-11 NOTE — Patient Instructions (Signed)

## 2016-11-12 ENCOUNTER — Encounter: Payer: Self-pay | Admitting: Internal Medicine

## 2016-11-12 ENCOUNTER — Ambulatory Visit (INDEPENDENT_AMBULATORY_CARE_PROVIDER_SITE_OTHER): Payer: Medicare Other | Admitting: Internal Medicine

## 2016-11-12 VITALS — BP 140/72 | HR 84 | Temp 97.4°F | Resp 16 | Ht 62.0 in | Wt 161.0 lb

## 2016-11-12 DIAGNOSIS — E559 Vitamin D deficiency, unspecified: Secondary | ICD-10-CM

## 2016-11-12 DIAGNOSIS — Z136 Encounter for screening for cardiovascular disorders: Secondary | ICD-10-CM | POA: Diagnosis not present

## 2016-11-12 DIAGNOSIS — Z0001 Encounter for general adult medical examination with abnormal findings: Secondary | ICD-10-CM

## 2016-11-12 DIAGNOSIS — I10 Essential (primary) hypertension: Secondary | ICD-10-CM | POA: Diagnosis not present

## 2016-11-12 DIAGNOSIS — E782 Mixed hyperlipidemia: Secondary | ICD-10-CM

## 2016-11-12 DIAGNOSIS — Z0000 Encounter for general adult medical examination without abnormal findings: Secondary | ICD-10-CM

## 2016-11-12 DIAGNOSIS — R7303 Prediabetes: Secondary | ICD-10-CM

## 2016-11-12 DIAGNOSIS — Z79899 Other long term (current) drug therapy: Secondary | ICD-10-CM

## 2016-11-12 DIAGNOSIS — Z1212 Encounter for screening for malignant neoplasm of rectum: Secondary | ICD-10-CM

## 2016-11-12 LAB — CBC WITH DIFFERENTIAL/PLATELET
Basophils Absolute: 57 cells/uL (ref 0–200)
Basophils Relative: 1 %
Eosinophils Absolute: 171 cells/uL (ref 15–500)
Eosinophils Relative: 3 %
HCT: 42.2 % (ref 35.0–45.0)
Hemoglobin: 13.8 g/dL (ref 11.7–15.5)
Lymphocytes Relative: 45 %
Lymphs Abs: 2565 cells/uL (ref 850–3900)
MCH: 28.6 pg (ref 27.0–33.0)
MCHC: 32.7 g/dL (ref 32.0–36.0)
MCV: 87.4 fL (ref 80.0–100.0)
MPV: 10.7 fL (ref 7.5–12.5)
Monocytes Absolute: 513 cells/uL (ref 200–950)
Monocytes Relative: 9 %
Neutro Abs: 2394 cells/uL (ref 1500–7800)
Neutrophils Relative %: 42 %
Platelets: 191 10*3/uL (ref 140–400)
RBC: 4.83 MIL/uL (ref 3.80–5.10)
RDW: 13.9 % (ref 11.0–15.0)
WBC: 5.7 10*3/uL (ref 3.8–10.8)

## 2016-11-12 LAB — BASIC METABOLIC PANEL WITH GFR
BUN: 16 mg/dL (ref 7–25)
CO2: 27 mmol/L (ref 20–31)
Calcium: 9.2 mg/dL (ref 8.6–10.4)
Chloride: 104 mmol/L (ref 98–110)
Creat: 0.78 mg/dL (ref 0.50–0.99)
GFR, Est African American: >89 mL/min (ref >=60)
GFR, Est Non African American: 79 mL/min (ref >=60)
Glucose, Bld: 102 mg/dL — ABNORMAL HIGH (ref 65–99)
Potassium: 4.4 mmol/L (ref 3.5–5.3)
Sodium: 141 mmol/L (ref 135–146)

## 2016-11-12 LAB — HEPATIC FUNCTION PANEL
ALT: 12 U/L (ref 6–29)
AST: 16 U/L (ref 10–35)
Albumin: 4 g/dL (ref 3.6–5.1)
Alkaline Phosphatase: 75 U/L (ref 33–130)
Bilirubin, Direct: 0.1 mg/dL (ref <=0.2)
Indirect Bilirubin: 0.3 mg/dL (ref 0.2–1.2)
Total Bilirubin: 0.4 mg/dL (ref 0.2–1.2)
Total Protein: 6.4 g/dL (ref 6.1–8.1)

## 2016-11-12 LAB — LIPID PANEL
Cholesterol: 238 mg/dL — ABNORMAL HIGH (ref <200)
HDL: 72 mg/dL (ref >50)
LDL Cholesterol: 132 mg/dL — ABNORMAL HIGH (ref <100)
Total CHOL/HDL Ratio: 3.3 Ratio (ref <5.0)
Triglycerides: 171 mg/dL — ABNORMAL HIGH (ref <150)
VLDL: 34 mg/dL — ABNORMAL HIGH (ref <30)

## 2016-11-12 LAB — TSH: TSH: 0.55 mIU/L

## 2016-11-13 ENCOUNTER — Other Ambulatory Visit: Payer: Self-pay | Admitting: Internal Medicine

## 2016-11-13 LAB — URINALYSIS, ROUTINE W REFLEX MICROSCOPIC
Bilirubin Urine: NEGATIVE
Glucose, UA: NEGATIVE
Hgb urine dipstick: NEGATIVE
Ketones, ur: NEGATIVE
Nitrite: NEGATIVE
Protein, ur: NEGATIVE
Specific Gravity, Urine: 1.016 (ref 1.001–1.035)
pH: 6 (ref 5.0–8.0)

## 2016-11-13 LAB — MICROALBUMIN / CREATININE URINE RATIO
Creatinine, Urine: 119 mg/dL (ref 20–320)
Microalb Creat Ratio: 3 mcg/mg creat (ref <30)
Microalb, Ur: 0.4 mg/dL

## 2016-11-13 LAB — URINALYSIS, MICROSCOPIC ONLY
Bacteria, UA: NONE SEEN [HPF]
Casts: NONE SEEN [LPF]
Yeast: NONE SEEN [HPF]

## 2016-11-13 LAB — MAGNESIUM: Magnesium: 1.9 mg/dL (ref 1.5–2.5)

## 2016-11-13 LAB — INSULIN, RANDOM: Insulin: 23.6 u[IU]/mL — ABNORMAL HIGH (ref 2.0–19.6)

## 2016-11-13 LAB — HEMOGLOBIN A1C
Hgb A1c MFr Bld: 5.5 % (ref <5.7)
Mean Plasma Glucose: 111 mg/dL

## 2016-11-13 LAB — VITAMIN D 25 HYDROXY (VIT D DEFICIENCY, FRACTURES): Vit D, 25-Hydroxy: 42 ng/mL (ref 30–100)

## 2016-11-13 MED ORDER — ROSUVASTATIN CALCIUM 40 MG PO TABS: ORAL_TABLET | ORAL | 6 refills | Status: DC

## 2016-11-15 ENCOUNTER — Observation Stay (HOSPITAL_COMMUNITY)
Admission: EM | Admit: 2016-11-15 | Discharge: 2016-11-16 | Disposition: A | Discharge status: Home / Self Care | Payer: Medicare Other | Attending: Internal Medicine | Admitting: Internal Medicine

## 2016-11-15 ENCOUNTER — Emergency Department (HOSPITAL_COMMUNITY): Payer: Medicare Other

## 2016-11-15 ENCOUNTER — Encounter (HOSPITAL_COMMUNITY): Payer: Self-pay | Admitting: Neurology

## 2016-11-15 DIAGNOSIS — Z91040 Latex allergy status: Secondary | ICD-10-CM | POA: Insufficient documentation

## 2016-11-15 DIAGNOSIS — R202 Paresthesia of skin: Secondary | ICD-10-CM | POA: Diagnosis present

## 2016-11-15 DIAGNOSIS — E559 Vitamin D deficiency, unspecified: Secondary | ICD-10-CM | POA: Insufficient documentation

## 2016-11-15 DIAGNOSIS — I10 Essential (primary) hypertension: Secondary | ICD-10-CM | POA: Diagnosis present

## 2016-11-15 DIAGNOSIS — R079 Chest pain, unspecified: Secondary | ICD-10-CM

## 2016-11-15 DIAGNOSIS — R42 Dizziness and giddiness: Secondary | ICD-10-CM | POA: Diagnosis not present

## 2016-11-15 DIAGNOSIS — Z87891 Personal history of nicotine dependence: Secondary | ICD-10-CM | POA: Insufficient documentation

## 2016-11-15 DIAGNOSIS — I700 Atherosclerosis of aorta: Secondary | ICD-10-CM | POA: Insufficient documentation

## 2016-11-15 DIAGNOSIS — E876 Hypokalemia: Secondary | ICD-10-CM

## 2016-11-15 DIAGNOSIS — F329 Major depressive disorder, single episode, unspecified: Secondary | ICD-10-CM | POA: Insufficient documentation

## 2016-11-15 DIAGNOSIS — E782 Mixed hyperlipidemia: Secondary | ICD-10-CM

## 2016-11-15 DIAGNOSIS — R072 Precordial pain: Secondary | ICD-10-CM

## 2016-11-15 DIAGNOSIS — Z7982 Long term (current) use of aspirin: Secondary | ICD-10-CM | POA: Diagnosis not present

## 2016-11-15 DIAGNOSIS — Z923 Personal history of irradiation: Secondary | ICD-10-CM | POA: Diagnosis not present

## 2016-11-15 DIAGNOSIS — R0789 Other chest pain: Secondary | ICD-10-CM | POA: Diagnosis not present

## 2016-11-15 DIAGNOSIS — R7303 Prediabetes: Secondary | ICD-10-CM | POA: Diagnosis not present

## 2016-11-15 DIAGNOSIS — E785 Hyperlipidemia, unspecified: Secondary | ICD-10-CM | POA: Diagnosis not present

## 2016-11-15 DIAGNOSIS — Z8249 Family history of ischemic heart disease and other diseases of the circulatory system: Secondary | ICD-10-CM | POA: Diagnosis not present

## 2016-11-15 DIAGNOSIS — M5441 Lumbago with sciatica, right side: Secondary | ICD-10-CM | POA: Insufficient documentation

## 2016-11-15 DIAGNOSIS — Z853 Personal history of malignant neoplasm of breast: Secondary | ICD-10-CM | POA: Insufficient documentation

## 2016-11-15 DIAGNOSIS — Z823 Family history of stroke: Secondary | ICD-10-CM | POA: Insufficient documentation

## 2016-11-15 DIAGNOSIS — Z86000 Personal history of in-situ neoplasm of breast: Secondary | ICD-10-CM | POA: Diagnosis present

## 2016-11-15 HISTORY — DX: Essential (primary) hypertension: I10

## 2016-11-15 LAB — BASIC METABOLIC PANEL
Anion gap: 11 (ref 5–15)
BUN: 11 mg/dL (ref 6–20)
CO2: 23 mmol/L (ref 22–32)
Calcium: 9.7 mg/dL (ref 8.9–10.3)
Chloride: 104 mmol/L (ref 101–111)
Creatinine, Ser: 0.73 mg/dL (ref 0.44–1.00)
GFR calc Af Amer: >60 mL/min (ref >60)
GFR calc non Af Amer: >60 mL/min (ref >60)
Glucose, Bld: 139 mg/dL — ABNORMAL HIGH (ref 65–99)
Potassium: 3.4 mmol/L — ABNORMAL LOW (ref 3.5–5.1)
Sodium: 138 mmol/L (ref 135–145)

## 2016-11-15 LAB — CBC
HCT: 43.6 % (ref 36.0–46.0)
Hemoglobin: 14.5 g/dL (ref 12.0–15.0)
MCH: 29.3 pg (ref 26.0–34.0)
MCHC: 33.3 g/dL (ref 30.0–36.0)
MCV: 88.1 fL (ref 78.0–100.0)
Platelets: 173 10*3/uL (ref 150–400)
RBC: 4.95 MIL/uL (ref 3.87–5.11)
RDW: 13.6 % (ref 11.5–15.5)
WBC: 6.4 10*3/uL (ref 4.0–10.5)

## 2016-11-15 LAB — I-STAT TROPONIN, ED
Troponin i, poc: 0 ng/mL (ref 0.00–0.08)
Troponin i, poc: 0 ng/mL (ref 0.00–0.08)

## 2016-11-15 LAB — TROPONIN I: Troponin I: <0.03 ng/mL (ref <0.03)

## 2016-11-15 MED ORDER — ALPRAZOLAM 0.25 MG PO TABS: 0.2500 mg | ORAL_TABLET | Freq: Two times a day (BID) | ORAL | Status: DC | PRN

## 2016-11-15 MED ORDER — ASPIRIN EC 81 MG PO TBEC
81.0000 mg | DELAYED_RELEASE_TABLET | Freq: Every day | ORAL | Status: DC
  Administered 2016-11-16: 81 mg via ORAL
  Filled 2016-11-15: qty 1

## 2016-11-15 MED ORDER — MORPHINE SULFATE (PF) 2 MG/ML IV SOLN: 2.0000 mg | INTRAVENOUS | Status: DC | PRN

## 2016-11-15 MED ORDER — ONDANSETRON HCL 4 MG/2ML IJ SOLN: 4.0000 mg | Freq: Four times a day (QID) | INTRAMUSCULAR | Status: DC | PRN

## 2016-11-15 MED ORDER — ACETAMINOPHEN 325 MG PO TABS: 650.0000 mg | ORAL_TABLET | ORAL | Status: DC | PRN

## 2016-11-15 MED ORDER — ENOXAPARIN SODIUM 40 MG/0.4ML ~~LOC~~ SOLN
40.0000 mg | SUBCUTANEOUS | Status: DC
  Filled 2016-11-15: qty 0.4

## 2016-11-15 MED ORDER — POTASSIUM CHLORIDE CRYS ER 20 MEQ PO TBCR
40.0000 meq | EXTENDED_RELEASE_TABLET | Freq: Once | ORAL | Status: AC
  Administered 2016-11-15: 40 meq via ORAL
  Filled 2016-11-15: qty 2

## 2016-11-15 MED ORDER — ASPIRIN 81 MG PO CHEW
324.0000 mg | CHEWABLE_TABLET | Freq: Once | ORAL | Status: AC
  Administered 2016-11-15: 324 mg via ORAL
  Filled 2016-11-15: qty 4

## 2016-11-15 MED ORDER — SODIUM CHLORIDE 0.9 % IV SOLN
INTRAVENOUS | Status: DC
  Administered 2016-11-15: 22:00:00 via INTRAVENOUS

## 2016-11-15 MED ORDER — INSULIN ASPART 100 UNIT/ML ~~LOC~~ SOLN: 0.0000 [IU] | Freq: Every day | SUBCUTANEOUS | Status: DC

## 2016-11-15 MED ORDER — INSULIN ASPART 100 UNIT/ML ~~LOC~~ SOLN: 0.0000 [IU] | Freq: Three times a day (TID) | SUBCUTANEOUS | Status: DC

## 2016-11-15 NOTE — ED Provider Notes (Signed)
Emergency Department Provider Note   I have reviewed the triage vital signs and the nursing notes.   HISTORY  Chief Complaint Chest pain  HPI Regina Shaw is a 66 y.o. female with PMH of HLD, HTN, and former smoker presents to the emergency department for evaluation of sudden onset chest heaviness and tingling in the arms and legs. Patient notes him to onset approximately 11 AM with no inciting event. No history of similar. No past history of heart attack or stroke. She denies any speech changes, difficult swallowing, difficult walking. Symptoms persisted throughout the day and then spontaneously resolved around 2 PM. She presented to the emergency department with her family felt slightly better after drinking a Coke. No radiation of symptoms. She is currently CP free. No associated dyspnea. No fever or chills.    Past Medical History:  Diagnosis Date  . DCIS (ductal carcinoma in situ) of breast 04/29/2012  . HTN (hypertension)   . Hyperlipidemia   . Vitamin D deficiency     Patient Active Problem List   Diagnosis Date Noted  . Hypokalemia 11/15/2016  . Chest pain 11/15/2016  . Screening for rectal cancer 11/07/2015  . Lumbar canal stenosis 02/09/2015  . Degenerative spondylolisthesis 02/09/2015  . Lumbago with Right Sciatica 11/13/2014  . Trochanteric bursitis of right hip (Mar 2016) 11/13/2014  . Depression, controlled 10/18/2014  . Medication management 10/17/2013  . Essential hypertension 09/07/2013  . Prediabetes 09/07/2013  . Vitamin D deficiency 09/07/2013  . Hyperlipidemia   . DCIS (ductal carcinoma in situ) of breast 04/29/2012    Past Surgical History:  Procedure Laterality Date  . BREAST LUMPECTOMY  09/2009   [place]    . LAMINECTOMY Right 07/18/2014   Procedure: Right Lumbar Four to Five Laminectomy for Synovial cyst;  Surgeon: Kristeen Miss, MD;  Location: [facility];  Service: Neurosurgery;  Laterality: Right;  Right L4-5  Laminectomy for Synovial cyst  . LUMBAR [facility]  . TONSILLECTOMY AND ADENOIDECTOMY  1960  . TUBAL LIGATION        Allergies Red yeast rice [cholestin]; Tape; Aleve [naproxen sodium]; Darvon [propoxyphene]; and Latex  Family History  Problem Relation Age of Onset  . Liver disease Mother     d/c at 59, acute liver failure  . COPD Father     d/c at 64  . Heart attack Brother 73  . Stroke Brother 51    same brother  . Alcohol abuse Brother     other brother    Social History Social History  Substance Use Topics  . Smoking status: Former Smoker    Quit date: 09/02/2005  . Smokeless tobacco: Never Used  . Alcohol use No    Review of Systems  Constitutional: No fever/chills. Positive lightheadedness.  Eyes: No visual changes. ENT: No sore throat. Cardiovascular: Positive chest pain. Respiratory: Denies shortness of breath. Gastrointestinal: No abdominal pain.  No nausea, no vomiting.  No diarrhea.  No constipation. Genitourinary: Negative for dysuria. Musculoskeletal: Negative for back pain. Skin: Negative for rash. Neurological: Negative for headaches, focal weakness or numbness. Positive tingling in the arms/legs  10-point ROS otherwise negative.  ____________________________________________   PHYSICAL EXAM:  VITAL SIGNS: ED Triage Vitals  Enc Vitals Group     BP 11/15/16 1415 (!) 149/80     Pulse Rate 11/15/16 1415 91     Resp 11/15/16 1415 (!) 21     Temp 11/15/16 1415  97.8 F (36.6 C)     Temp Source 11/15/16 1415 Oral     SpO2 11/15/16 1415 100 %     Weight 11/15/16 1415 161 lb (73 kg)     Height 11/15/16 1415 5\' 2"  (1.575 m)     Pain Score 11/15/16 1420 5   Constitutional: Alert and oriented. Well appearing and in no acute distress. Eyes: Conjunctivae are normal.  Head: Atraumatic. Nose: No congestion/rhinnorhea. Mouth/Throat: Mucous membranes are moist. Neck: No stridor.   Cardiovascular: Normal rate, regular rhythm.  Good peripheral circulation. Grossly normal heart sounds.   Respiratory: Normal respiratory effort.  No retractions. Lungs CTAB. Gastrointestinal: Soft and nontender. No distention.  Musculoskeletal: No lower extremity tenderness nor edema. No gross deformities of extremities. Neurologic:  Normal speech and language. No gross focal neurologic deficits are appreciated.  Skin:  Skin is warm, dry and intact. No rash noted. Psychiatric: Mood and affect are normal. Speech and behavior are normal.  ____________________________________________   LABS (all labs ordered are listed, but only abnormal results are displayed)  Labs Reviewed  BASIC METABOLIC PANEL - Abnormal; Notable for the following:       Result Value   Potassium 3.4 (*)    Glucose, Bld 139 (*)    All other components within normal limits  GLUCOSE, CAPILLARY - Abnormal; Notable for the following:    Glucose-Capillary 105 (*)    All other components within normal limits  GLUCOSE, CAPILLARY - Abnormal; Notable for the following:    Glucose-Capillary 109 (*)    All other components within normal limits  CBC  TROPONIN I  TROPONIN I  TROPONIN I  I-STAT TROPOININ, ED  I-STAT TROPOININ, ED   ____________________________________________  EKG   EKG Interpretation  Date/Time:  Friday November 15 2016 14:29:57 EDT Ventricular Rate:  91 PR Interval:  162 QRS Duration: 82 QT Interval:  382 QTC Calculation: 469 R Axis:   82 Text Interpretation:  Normal sinus rhythm Normal ECG No STEMI.  Confirmed by Angelina Neece MD, Jennife Zaucha [phone]) on 11/15/2016 5:20:53 PM       ____________________________________________  RADIOLOGY  Dg Chest 2 View  Result Date: 11/15/2016 CLINICAL DATA:  Weakness, nausea, chest tightness and trembling since 10 a.m. this morning. EXAM: CHEST  2 VIEW COMPARISON:  PA and lateral chest 06/05/2014. FINDINGS: Lungs are clear. Heart size is normal. No pneumothorax or pleural fluid. Aortic atherosclerosis is seen. No  bony abnormality. IMPRESSION: No acute disease. Atherosclerosis. Electronically Signed   By: Inge Rise M.D.   On: 11/15/2016 15:04    ____________________________________________   PROCEDURES  Procedure(s) performed:   Procedures  None ____________________________________________   INITIAL IMPRESSION / ASSESSMENT AND PLAN / ED COURSE  Pertinent labs & imaging results that were available during my care of the patient were reviewed by me and considered in my medical decision making (see chart for details).  Patient resents the emergency department for evaluation of chest pressure, bilateral arm/leg tingling, and lightheadedness. Symptoms lasted for approximately 3 hours and resolved spontaneously. She is currently chest pain-free. She has a strong family history of coronary artery disease. She is a former Manjot Beumer-time smoker but stopped in 2007, with HTN, and HLD. HEART score 5. Initial troponin and EKG normal. Plan for observational admission for CP r/o and consideration of provocative testing. Discussed impression and plan with patient. Gave ASA.   Patient second troponin is negative. With multiple risk factors and concerning story I plan for admission for chest pain evaluation.   Discussed  patient's case with hospitalist. Patient and family (if present) updated with plan. Care transferred to hospitalist service.  I reviewed all nursing notes, vitals, pertinent old records, EKGs, labs, imaging (as available).  ____________________________________________  FINAL CLINICAL IMPRESSION(S) / ED DIAGNOSES  Final diagnoses:  Precordial chest pain  Tingling in extremities  Lightheadedness     MEDICATIONS GIVEN DURING THIS VISIT:  Medications  aspirin EC tablet 81 mg (not administered)  enoxaparin (LOVENOX) injection 40 mg (not administered)  morphine 2 MG/ML injection 2 mg (not administered)  acetaminophen (TYLENOL) tablet 650 mg (not administered)  ondansetron (ZOFRAN)  injection 4 mg (not administered)  0.9 %  sodium chloride infusion ( Intravenous New Bag/Given 11/15/16 2200)  ALPRAZolam (XANAX) tablet 0.25 mg (not administered)  insulin aspart (novoLOG) injection 0-5 Units (0 Units Subcutaneous Not Given 11/15/16 2345)  insulin aspart (novoLOG) injection 0-9 Units (0 Units Subcutaneous Not Given 11/16/16 0700)  aspirin chewable tablet 324 mg (324 mg Oral Given 11/15/16 1800)  potassium chloride SA (K-DUR,KLOR-CON) CR tablet 40 mEq (40 mEq Oral Given 11/15/16 2230)     NEW OUTPATIENT MEDICATIONS STARTED DURING THIS VISIT:  None   Note:  This document was prepared using Dragon voice recognition software and may include unintentional dictation errors.  Nanda Quinton, MD Emergency Medicine  Margette Fast, MD 11/16/16 [phone]

## 2016-11-15 NOTE — ED Triage Notes (Signed)
Pt reports tightness in chest, tingling in both arms and legs since 1030 today, h/a, feeling shaky. Tried to eat something, had peanut butter, but didn't help. Has been feeling nauseated. She went to a movie today, and sat through the movie, but continues to feel chest pressure, nauseated, weakness. Feels like she needs to undo her bra. Is a x 4. Denies any cardiac history.

## 2016-11-15 NOTE — H&P (Signed)
Regina Shaw:[id number] DOB: 07/16/1951 DOA: 11/15/2016     PCP: Alesia Richards, MD   Outpatient Specialists: none   Patient coming from:   home Lives alone,       Chief Complaint: Chest pain  HPI: Regina Shaw is a 66 y.o. female with medical history significant of Former Tobacco abuse,  ,HLD, prediabetes  breast cancer    Presented with chest pain lasting 3 h radiating to left arm with some tingling as discomfort described as tightness also tingling in both arms started at 10:30 today she felt that maybe her sugar was low she try to eat something like peanut butter but did not seem to help she fell some nausea associated this. She went to see mom be continued to have chest discomfort and feeling weak all over.She reports feeling like her bra is too tight.  Currently chest pain free. No associated neurological platelets. Symptoms results continuously at 2 PM states that she is feeling a bit better after she drank a Coke. No shortness of breath fevers or chills associated with this. She reports was seen recently by PCP and he told her her BP was elevated he wanted her to check her BP at home. She have had some recent back surgery and have been taking ALEVE she developed a rash. Once she stopped aleve her BP improved.  Reports heavy salt diet but not lately, feels light headed when she sitting up.  Reports does not tolerate statins only takes few times a week.   Regarding pertinent Chronic problems: History of hypertension well controlled patient has been told in the past she is prediabetic   IN ER:  Temp (24hrs), Avg:97.8 F (36.6 C), Min:97.8 F (36.6 C), Max:97.8 F (36.6 C)     RR 15 HR 63 BP 141/81 Trop 0.00 x2  K 3.4 Cr 0.73 WBC 6.4 Hg 14.5 CXR nonacute  Following Medications were ordered in ER: Medications  aspirin chewable tablet 324 mg (324 mg Oral Given 11/15/16 1800)      Hospitalist was called for admission for chest  pain evaluation  Review of  Systems:    Pertinent positives include:  chest pain, fatigue,   Constitutional:  No weight loss, night sweats, Fevers, chills, weight loss  HEENT:  No headaches, Difficulty swallowing,Tooth/dental problems,Sore throat,  No sneezing, itching, ear ache, nasal congestion, post nasal drip,  Cardio-vascular:  No Orthopnea, PND, anasarca, dizziness, palpitations.no Bilateral lower extremity swelling  GI:  No heartburn, indigestion, abdominal pain, nausea, vomiting, diarrhea, change in bowel habits, loss of appetite, melena, blood in stool, hematemesis Resp:  no shortness of breath at rest. No dyspnea on exertion, No excess mucus, no productive cough, No non-productive cough, No coughing up of blood.No change in color of mucus.No wheezing. Skin:  no rash or lesions. No jaundice GU:  no dysuria, change in color of urine, no urgency or frequency. No straining to urinate.  No flank pain.  Musculoskeletal:  No joint pain or no joint swelling. No decreased range of motion. No back pain.  Psych:  No change in mood or affect. No depression or anxiety. No memory loss.  Neuro: no localizing neurological complaints, no tingling, no weakness, no double vision, no gait abnormality, no slurred speech, no confusion  As per HPI otherwise 10 point review of systems negative.   Past Medical History: Past Medical History:  Diagnosis Date  . Cancer ([place])   . DCIS (ductal carcinoma in situ) of breast 04/29/2012  .  Hyperlipidemia   . Vitamin D deficiency    Past Surgical History:  Procedure Laterality Date  . BREAST LUMPECTOMY  09/2009   [place]    . LAMINECTOMY Right 07/18/2014   Procedure: Right Lumbar Four to Five Laminectomy for Synovial cyst;  Surgeon: Kristeen Miss, MD;  Location: [facility];  Service: Neurosurgery;  Laterality: Right;  Right L4-5 Laminectomy for Synovial cyst  . LUMBAR [facility]  . TONSILLECTOMY AND ADENOIDECTOMY  1960  . TUBAL  LIGATION       Social History:  Ambulatory   Independently     reports that she quit smoking about 11 years ago. She has never used smokeless tobacco. She reports that she does not drink alcohol or use drugs.  Allergies:   Allergies  Allergen Reactions  . Red Yeast Rice [Cholestin]     Dizzy  . Tape Other (See Comments)    Pt states that skin burns, and it hurts severely.  . Darvon [Propoxyphene] Nausea And Vomiting and Anxiety       Family History:   Family History  Problem Relation Age of Onset  . Liver disease Mother     d/c at 6, acute liver failure  . COPD Father     d/c at 65  . Heart attack Brother 38  . Stroke Brother 11    same brother  . Alcohol abuse Brother     other brother    Medications: Prior to Admission medications   Medication Sig Start Date End Date Taking? Authorizing Provider  aspirin EC 81 MG tablet Take 81 mg by mouth daily.    Historical Provider, MD  cholecalciferol (VITAMIN D) 1000 UNITS tablet Take 5,000 Units by mouth 2 (two) times daily.     Historical Provider, MD  mometasone (ELOCON) 0.1 % cream Apply to affected area daily 10/29/16 10/29/17  Unk Pinto, MD  Multiple Vitamin (MULTIVITAMIN) tablet Take 1 tablet by mouth daily.    Historical Provider, MD  Omega-3 Fatty Acids (FISH OIL PO) Take 2,000 mg by mouth daily.    Historical Provider, MD  rosuvastatin (CRESTOR) 40 MG tablet Take 1/2 to 1 tablet daily or as directed for Cholesterol 11/13/16 11/13/17  Unk Pinto, MD    Physical Exam: Patient Vitals for the past 24 hrs:  BP Temp Temp src Pulse Resp SpO2 Height Weight  11/15/16 1845 (!) 141/81 - - 63 15 95 % - -  11/15/16 1830 (!) 152/61 - - 80 16 98 % - -  11/15/16 1815 (!) 145/86 - - (!) 40 (!) 21 96 % - -  11/15/16 1800 (!) 142/87 - - (!) 37 15 98 % - -  11/15/16 1745 (!) 148/72 - - (!) 41 14 99 % - -  11/15/16 1716 (!) 146/76 - - 80 19 97 % - -  11/15/16 1614 139/69 - - 79 17 96 % - -  11/15/16 1415 (!) 149/80  97.8 F (36.6 C) Oral 91 (!) 21 100 % 5\' 2"  (1.575 m) 73 kg (161 lb)    1. General:  in No Acute distress 2. Psychological: Alert and   Oriented 3. Head/ENT:    Dry Mucous Membranes                          Head Non traumatic, neck supple  Normal   Dentition 4. SKIN:   decreased Skin turgor,  Skin clean Dry and intact no rash 5. Heart: Regular rate and rhythm no  Murmur, Rub or gallop 6. Lungs:  lear to auscultation bilaterally, no wheezes or crackles   7. Abdomen: Soft,  non-tender, Non distended 8. Lower extremities: no clubbing, cyanosis, or edema 9. Neurologically Grossly intact, moving all 4 extremities equally  10. MSK: Normal range of motion   body mass index is 29.45 kg/m.  Labs on Admission:   Labs on Admission: I have personally reviewed following labs and imaging studies  CBC:  Recent Labs Lab 11/12/16 1536 11/15/16 1423  WBC 5.7 6.4  NEUTROABS 2,394  --   HGB 13.8 14.5  HCT 42.2 43.6  MCV 87.4 88.1  PLT 191 676   Basic Metabolic Panel:  Recent Labs Lab 11/12/16 1536 11/15/16 1423  NA 141 138  K 4.4 3.4*  CL 104 104  CO2 27 23  GLUCOSE 102* 139*  BUN 16 11  CREATININE 0.78 0.73  CALCIUM 9.2 9.7  MG 1.9  --    GFR: Estimated Creatinine Clearance: 63.9 mL/min (by C-G formula based on SCr of 0.73 mg/dL). Liver Function Tests:  Recent Labs Lab 11/12/16 1536  AST 16  ALT 12  ALKPHOS 75  BILITOT 0.4  PROT 6.4  ALBUMIN 4.0   No results for input(s): LIPASE, AMYLASE in the last 168 hours. No results for input(s): AMMONIA in the last 168 hours. Coagulation Profile: No results for input(s): INR, PROTIME in the last 168 hours. Cardiac Enzymes: No results for input(s): CKTOTAL, CKMB, CKMBINDEX, TROPONINI in the last 168 hours. BNP (last 3 results) No results for input(s): PROBNP in the last 8760 hours. HbA1C: No results for input(s): HGBA1C in the last 72 hours. CBG: No results for input(s): GLUCAP in the last 168  hours. Lipid Profile: No results for input(s): CHOL, HDL, LDLCALC, TRIG, CHOLHDL, LDLDIRECT in the last 72 hours. Thyroid Function Tests: No results for input(s): TSH, T4TOTAL, FREET4, T3FREE, THYROIDAB in the last 72 hours. Anemia Panel: No results for input(s): VITAMINB12, FOLATE, FERRITIN, TIBC, IRON, RETICCTPCT in the last 72 hours. Urine analysis:    Component Value Date/Time   COLORURINE YELLOW 11/12/2016 1536   APPEARANCEUR CLEAR 11/12/2016 1536   LABSPEC 1.016 11/12/2016 1536   PHURINE 6.0 11/12/2016 1536   GLUCOSEU NEGATIVE 11/12/2016 1536   HGBUR NEGATIVE 11/12/2016 1536   BILIRUBINUR NEGATIVE 11/12/2016 1536   KETONESUR NEGATIVE 11/12/2016 1536   PROTEINUR NEGATIVE 11/12/2016 1536   UROBILINOGEN 0.2 06/27/2014 1318   NITRITE NEGATIVE 11/12/2016 1536   LEUKOCYTESUR 1+ (A) 11/12/2016 1536   Sepsis Labs: @LABRCNTIP (procalcitonin:4,lacticidven:4) )No results found for this or any previous visit (from the past 240 hour(s)).      UA  no evidence of UTI     Lab Results  Component Value Date   HGBA1C 5.5 11/12/2016    Estimated Creatinine Clearance: 63.9 mL/min (by C-G formula based on SCr of 0.73 mg/dL).  BNP (last 3 results) No results for input(s): PROBNP in the last 8760 hours.   ECG REPORT  Independently reviewed Rate: 91  Rhythm: NSR ST&T Change: No acute ischemic changes   QTC 469  Filed Weights   11/15/16 1415  Weight: 73 kg (161 lb)     Cultures: No results found for: SDES, SPECREQUEST, CULT, REPTSTATUS   Radiological Exams on Admission: Dg Chest 2 View  Result Date: 11/15/2016 CLINICAL DATA:  Weakness, nausea, chest tightness and trembling  since 10 a.m. this morning. EXAM: CHEST  2 VIEW COMPARISON:  PA and lateral chest 06/05/2014. FINDINGS: Lungs are clear. Heart size is normal. No pneumothorax or pleural fluid. Aortic atherosclerosis is seen. No bony abnormality. IMPRESSION: No acute disease. Atherosclerosis. Electronically Signed   By:  Inge Rise M.D.   On: 11/15/2016 15:04    Chart has been reviewed    Assessment/Plan  66 y.o. female with medical history significant of Former Tobacco abuse, HTN ,HLD, prediabetes  breast cancer admitted for evaluation of chest pain   Present on Admission:  . Chest pain - - given risk factors will admit, monitor on telemetry, cycle cardiac enzymes, obtain serial ECG. Further risk stratify with lipid panel, hgA1C, obtain TSH. Make sure patient is on Aspirin. Further treatment based on the currently pending results.   . Hyperlipidemia - stable continue home medications . Prediabetes - monitor BG, SSI . Hypokalemia - replace check magnesium   Other plan as per orders.  DVT prophylaxis:  Lovenox     Code Status:  FULL CODE as per patient    Family Communication:   Family not  at  Bedside    Disposition Plan:   To home once workup is complete and patient is stable                                               Consults called: email cardiology   Admission status:    obs   Level of care   tele           I have spent a total of 56 min on this admission  Raye Slyter 11/15/2016, 11:35 PM    [facility]  Pager [phone]   after 2 AM please page floor coverage PA If 7AM-7PM, please contact the day team taking care of the patient  [email or web address]  Password [email or web address]

## 2016-11-16 ENCOUNTER — Other Ambulatory Visit (HOSPITAL_COMMUNITY): Payer: Medicare Other

## 2016-11-16 ENCOUNTER — Other Ambulatory Visit: Payer: Self-pay | Admitting: Physician Assistant

## 2016-11-16 ENCOUNTER — Encounter (HOSPITAL_COMMUNITY): Payer: Self-pay | Admitting: Physician Assistant

## 2016-11-16 DIAGNOSIS — E785 Hyperlipidemia, unspecified: Secondary | ICD-10-CM | POA: Diagnosis not present

## 2016-11-16 DIAGNOSIS — I10 Essential (primary) hypertension: Secondary | ICD-10-CM | POA: Diagnosis not present

## 2016-11-16 DIAGNOSIS — R7303 Prediabetes: Secondary | ICD-10-CM | POA: Diagnosis not present

## 2016-11-16 DIAGNOSIS — R072 Precordial pain: Secondary | ICD-10-CM

## 2016-11-16 DIAGNOSIS — R079 Chest pain, unspecified: Secondary | ICD-10-CM

## 2016-11-16 DIAGNOSIS — R0789 Other chest pain: Secondary | ICD-10-CM | POA: Diagnosis not present

## 2016-11-16 LAB — GLUCOSE, CAPILLARY
Glucose-Capillary: 105 mg/dL — ABNORMAL HIGH (ref 65–99)
Glucose-Capillary: 109 mg/dL — ABNORMAL HIGH (ref 65–99)
Glucose-Capillary: 164 mg/dL — ABNORMAL HIGH (ref 65–99)

## 2016-11-16 LAB — TROPONIN I: Troponin I: <0.03 ng/mL (ref <0.03)

## 2016-11-16 NOTE — Progress Notes (Signed)
Iv removed. No issues at present. Reviewed discharge. Pt awaiting ride.   Amariya Liskey, Mervin Kung RN

## 2016-11-16 NOTE — Care Management Note (Signed)
Case Management Note  Patient Details  Name: Regina Shaw MRN: [id number] Date of Birth: [birth year]  Subjective/Objective:                  Chest pain Action/Plan: Discharge planning Expected Discharge Date:  11/16/16               Expected Discharge Plan:  Home/Self Care  In-House Referral:     Discharge planning Services  CM Consult  Post Acute Care Choice:  NA Choice offered to:  NA  DME Arranged:  N/A DME Agency:  NA  HH Arranged:  NA HH Agency:  NA  Status of Service:  Completed, signed off  If discussed at [place] of Stay Meetings, dates discussed:    Additional Comments: CM notes no home health services recommended nor ordered and no DME recommended nor ordered. Pt has insurance and PCP.  No other CM needs were communicated.  Dellie Catholic, RN 11/16/2016, 10:55 AM

## 2016-11-16 NOTE — Consult Note (Signed)
CARDIOLOGY CONSULT NOTE   Patient ID: [name] MRN: [id number] DOB/AGE: [birth year] 68 y.o.  Admit date: 11/15/2016  Requesting Physician: Dr. Maylene Roes Primary Physician:   Alesia Richards, MD Primary Cardiologist:  [place] Reason for Consultation:  Chest pain  HPI: Regina Shaw is a 66 y.o. female with a history of breast cancer s/p radiation and lumpectomy in remission, HTN, HLD, pre diabetes, and former tobacco abuse who presented to [facility] on 11/15/16 for evaluation of chest pain.  She has no past cardiac history and was recently seen by her PCP. Her LDL cholesterol was noted to be 130 and she was charted on low-dose Crestor twice a week. She has a long history of intolerance to statins due to severe myalgias. She is a family history of CAD in her brother who had a MI in his 3s but was a heavy drinker and smoker. She did smoke for about 15 years and has a 15 year pack history. She does exercise regularly and walks to participate in silver sneakers. She denies a history of exertional chest pain or shortness of breath.  She was in her usual state of health until this past Thursday when she had minimal PO intake due to being busy with her uncle's funeral. She started to feel weak and nauseated. She tried to go on with her today and went to a movie with some friends. After the film she started to feel really nauseated and weak. Her friend had to help her to her car. At that time she had 5 out of 10 chest tightness. There was no radiation or associated shortness of breath. She continued to feel nauseated. Her friends urged her to go to the ER to be seen. In the emergency department she was given a Coke which did provide some relief in her chest pain. She's been chest pain-free since. She denies lower extremity edema, orthopnea or PND. No syncope. She is currently feeling better and would like to go home.   Past Medical History:  Diagnosis Date  . DCIS (ductal carcinoma in  situ) of breast 04/29/2012  . HTN (hypertension)   . Hyperlipidemia   . Vitamin D deficiency      Past Surgical History:  Procedure Laterality Date  . BREAST LUMPECTOMY  09/2009   [facility]    . LAMINECTOMY Right 07/18/2014   Procedure: Right Lumbar Four to Five Laminectomy for Synovial cyst;  Surgeon: Kristeen Miss, MD;  Location: [facility];  Service: Neurosurgery;  Laterality: Right;  Right L4-5 Laminectomy for Synovial cyst  . LUMBAR [facility]  . TONSILLECTOMY AND ADENOIDECTOMY  1960  . TUBAL LIGATION      Allergies  Allergen Reactions  . Red Yeast Rice [Cholestin] Other (See Comments)    Dizzy  . Tape Other (See Comments)    Pt states that skin burns, and it hurts severely.  Tori Milks [Naproxen Sodium] Rash  . Darvon [Propoxyphene] Nausea And Vomiting and Anxiety  . Latex Rash    Contact makes the skin feel "burned"    I have reviewed the patient's current medications . aspirin EC  81 mg Oral Daily  . enoxaparin (LOVENOX) injection  40 mg Subcutaneous Q24H  . insulin aspart  0-5 Units Subcutaneous QHS  . insulin aspart  0-9 Units Subcutaneous TID WC    acetaminophen, ALPRAZolam, morphine injection, ondansetron (ZOFRAN) IV  Prior to Admission medications  Medication Sig Start Date End Date Taking? Authorizing Provider  aspirin EC 81 MG tablet Take 81 mg by mouth daily.   Yes Historical Provider, MD  cetaphil (CETAPHIL) cream Apply 1 application topically daily as needed (to facial irritation).   Yes Historical Provider, MD  cholecalciferol (VITAMIN D) 1000 UNITS tablet Take 5,000 Units by mouth daily.    Yes Historical Provider, MD  Multiple Vitamin (MULTIVITAMIN) tablet Take 1 tablet by mouth daily.   Yes Historical Provider, MD  Multiple Vitamins-Minerals (ICAPS LUTEIN & OMEGA-3 PO) Take 1 capsule by mouth daily.   Yes Historical Provider, MD  UNABLE TO FIND Herbal Life: Mix 3 scoops of powder into 8 ounces of water  (including ice, 1/2 strawberry, a slice of banana) and drink every morning   Yes Historical Provider, MD  UNABLE TO FIND Herbal Life: Mix 1 scoop of powder into 16 ounces of water and drink once a day   Yes Historical Provider, MD  mometasone (ELOCON) 0.1 % cream Apply to affected area daily Patient not taking: Reported on 11/15/2016 10/29/16 10/29/17  Unk Pinto, MD  rosuvastatin (CRESTOR) 40 MG tablet Take 1/2 to 1 tablet daily or as directed for Cholesterol Patient taking differently: Take 20 mg by mouth 2 (two) times a week. [place] 11/13/16 11/13/17  Unk Pinto, MD     Social History   Social History  . Marital status: Widowed    Spouse name: N/A  . Number of children: N/A  . Years of education: N/A   Occupational History  . Not on file.   Social History Main Topics  . Smoking status: Former Smoker    Quit date: 09/02/2005  . Smokeless tobacco: Never Used  . Alcohol use No  . Drug use: No  . Sexual activity: Not on file   Other Topics Concern  . Not on file   Social History Narrative  . No narrative on file    Family Status  Relation Status  . Mother   . Father   . Brother   . Brother   . Brother    Family History  Problem Relation Age of Onset  . Liver disease Mother     d/c at 38, acute liver failure  . COPD Father     d/c at 44  . Heart attack Brother 16  . Stroke Brother 53    same brother  . Alcohol abuse Brother     other brother    ROS:  Full 14 point review of systems complete and found to be negative unless listed above.  Physical Exam: Blood pressure (!) 117/58, pulse 75, temperature 98 F (36.7 C), temperature source Oral, resp. rate (!) 21, height 5\' 2"  (1.575 m), weight 159 lb 9.6 oz (72.4 kg), SpO2 97 %.  General: Well developed, well nourished, female in no acute distress Head: Eyes PERRLA, No xanthomas.   Normocephalic and atraumatic, oropharynx without edema or exudate.   Lungs: CTAB Heart: HRRR S1 S2, no rub/gallop,  Heart regular rate and rhythm with S1, S2  murmur. pulses are 2+ extrem.   Neck: No carotid bruits. No lymphadenopathy. No JVD. Abdomen: Bowel sounds present, abdomen soft and non-tender without masses or hernias noted. Msk:  No spine or cva tenderness. No weakness, no joint deformities or effusions. Extremities: No clubbing or cyanosis.  No LE edema.  Neuro: Alert and oriented X 3. No focal deficits noted. Psych:  Good affect, responds appropriately Skin: No rashes or lesions noted.  Labs:   Lab Results  Component Value Date   WBC 6.4 11/15/2016   HGB 14.5 11/15/2016   HCT 43.6 11/15/2016   MCV 88.1 11/15/2016   PLT 173 11/15/2016   No results for input(s): INR in the last 72 hours.   Recent Labs Lab 11/12/16 1536 11/15/16 1423  NA 141 138  K 4.4 3.4*  CL 104 104  CO2 27 23  BUN 16 11  CREATININE 0.78 0.73  CALCIUM 9.2 9.7  PROT 6.4  --   BILITOT 0.4  --   ALKPHOS 75  --   ALT 12  --   AST 16  --   GLUCOSE 102* 139*  ALBUMIN 4.0  --    Magnesium  Date Value Ref Range Status  11/12/2016 1.9 1.5 - 2.5 mg/dL Final    Recent Labs  11/15/16 2240 11/16/16 0545  TROPONINI <0.03 <0.03    Recent Labs  11/15/16 1454 11/15/16 1813  TROPIPOC 0.00 0.00   No results found for: PROBNP Lab Results  Component Value Date   CHOL 238 (H) 11/12/2016   HDL 72 11/12/2016   LDLCALC 132 (H) 11/12/2016   TRIG 171 (H) 11/12/2016   Lab Results  Component Value Date   DDIMER 0.32 06/05/2014   No results found for: LIPASE, AMYLASE TSH  Date/Time Value Ref Range Status  11/12/2016 03:36 PM 0.55 mIU/L Final    Comment:      Reference Range   > or = 20 Years  0.40-4.50   Pregnancy Range First trimester  0.26-2.66 Second trimester 0.55-2.73 Third trimester  0.43-2.91      Vitamin B-12  Date/Time Value Ref Range Status  10/18/2013 03:39 PM 726 211 - 911 pg/mL Final   TIBC  Date/Time Value Ref Range Status  10/18/2014 03:52 PM 354 250 - 470 ug/dL Final    Iron  Date/Time Value Ref Range Status  10/18/2014 03:52 PM 94 42 - 145 ug/dL Final    Echo: none  ECG:  NSR, LAFB, TWI in inferior leads. Unusual axis, suspect lead reversal - personally reviewed  TELE: NSR with PACs - personally reviewed  Radiology:  Dg Chest 2 View  Result Date: 11/15/2016 CLINICAL DATA:  Weakness, nausea, chest tightness and trembling since 10 a.m. this morning. EXAM: CHEST  2 VIEW COMPARISON:  PA and lateral chest 06/05/2014. FINDINGS: Lungs are clear. Heart size is normal. No pneumothorax or pleural fluid. Aortic atherosclerosis is seen. No bony abnormality. IMPRESSION: No acute disease. Atherosclerosis. Electronically Signed   By: Inge Rise M.D.   On: 11/15/2016 15:04    ASSESSMENT AND PLAN:    Principal Problem:   Chest pain Active Problems:   DCIS (ductal carcinoma in situ) of breast   Hyperlipidemia   Essential hypertension   Prediabetes   Hypokalemia  Regina Shaw is a 66 y.o. female with a history of breast cancer s/p radiation and lumpectomy in remission, HTN, HLD, pre diabetes, and former tobacco abuse who presented to [facility] on 11/15/16 for evaluation of chest pain.  Chest pain: troponin neg x 2. She is currently chest pain-free. ECG on admission showed no acute ST or T-wave changes. ECG today did show some new T-wave inversions; however I suspect there was lead reversal given unusual axis and different QRS morphology. Will have this repeated. Patient is eager to go home. She does have risk factors (HTN, HLD, pre diabetes) and family history of CAD. However, her brother was a heavy drinker and smoker. Will plan  for outpatient nuclear stress test and follow-up in our office. I will have both these arranged.  HTN: BP well controlled currently  HLD: She has a long history of statin intolerance and gets severe myalgias. She was recently found to have an LDL ~130. Her PCP put her on twice weekly low-dose Crestor. Consider adding Zetia.     Signed: Angelena Form, PA-C 11/16/2016 8:00 AM  Pager [phone]  Co-Sign MD  Attending Note:   The patient was seen and examined.  Agree with assessment and plan as noted above.  Changes made to the above note as needed.  Patient seen and independently examined with Nell Range, PA.   We discussed all aspects of the encounter. I agree with the assessment and plan as stated above.  1.  Atypical CP:   Her CP is very atypical.  ECG is non acute.  Troponins are negative.   We will arrange for outpatient evaluation   I have spent a total of 40 minutes with patient reviewing hospital  notes , telemetry, EKGs, labs and examining patient as well as establishing an assessment and plan that was discussed with the patient. > 50% of time was spent in direct patient care.    Thayer Headings, Brooke Bonito., MD, [facility] 11/18/2016, 7:09 AM [address]  [place] Pager [phone]

## 2016-11-16 NOTE — Discharge Summary (Signed)
Physician Discharge Summary  Regina Shaw:[id number] DOB: 09-09-1950 DOA: 11/15/2016  PCP: Alesia Richards, MD  Admit date: 11/15/2016 Discharge date: 11/16/2016  Admitted From: Home Disposition:  Home  Recommendations for Outpatient Follow-up:  1. Follow up with PCP in 1 week 2. Follow up with Cardiology for outpatient stress test  3. Please obtain BMP in 1 week   Home Health: No  Equipment/Devices: No   Discharge Condition: Stable CODE STATUS: Full  Diet recommendation: Heart healthy   Brief/Interim Summary: Regina VANDERWEELE is a 66 y.o. female with past medical history of former tobacco abuse, hyperlipidemia, prediabetes, history of breast cancer status post radiation and lumpectomy in remission who presents with chest pain. She admits to central chest discomfort that lasted approximately 4 hours yesterday morning. She drank some Coca-Cola with resolution of chest pain. She denies any associated symptoms with this. She does have a family history of a brother who had a heart attack in his 48s. She was admitted to the hospital for further chest pain workup. Troponin was monitored, which was negative. She did have some EKG changes of T-wave inversions in inferior leads on morning of 3/17 which resolved on repeat EKG. Cardiology evaluated patient and set her up for outpatient stress test. She did not have any further recurrence of chest pain or chest pressure during hospitalization.  Discharge Diagnoses:  Principal Problem:   Chest pain Active Problems:   DCIS (ductal carcinoma in situ) of breast   Hyperlipidemia   Essential hypertension   Prediabetes   Hypokalemia  Chest pain -Troponin has been negative -Initial EKG was normal sinus rhythm without T wave changes, EKG on morning of 3/17 with T-wave inversions in inferior leads, repeat EKG on morning of 3/17 with normal sinus rhythm without any T-wave changes -Cardiology consulted, set up for outpatient follow-up with  outpatient stress test -Chest pain free at time of discharge -Aspirin   HLD -Statin  Prediabetes -Follow up with PCP  Hypokalemia -Replaced   Discharge Instructions  Discharge Instructions    Diet - low sodium heart healthy    Complete by:  As directed    Increase activity slowly    Complete by:  As directed      Allergies as of 11/16/2016      Reactions   Red Yeast Rice [cholestin] Other (See Comments)   Dizzy   Tape Other (See Comments)   Pt states that skin burns, and it hurts severely.   Aleve [naproxen Sodium] Rash   Darvon [propoxyphene] Nausea And Vomiting, Anxiety   Latex Rash   Contact makes the skin feel "burned"      Medication List    TAKE these medications   aspirin EC 81 MG tablet Take 81 mg by mouth daily.   cetaphil cream Apply 1 application topically daily as needed (to facial irritation).   cholecalciferol 1000 units tablet Commonly known as:  VITAMIN D Take 5,000 Units by mouth daily.   ICAPS LUTEIN & OMEGA-3 PO Take 1 capsule by mouth daily.   mometasone 0.1 % cream Commonly known as:  ELOCON Apply to affected area daily   multivitamin tablet Take 1 tablet by mouth daily.   rosuvastatin 40 MG tablet Commonly known as:  CRESTOR Take 1/2 to 1 tablet daily or as directed for Cholesterol What changed:  how much to take  how to take this  when to take this  additional instructions   UNABLE TO FIND Herbal Life: Mix 3 scoops of powder into  8 ounces of water (including ice, 1/2 strawberry, a slice of banana) and drink every morning   UNABLE TO FIND Herbal Life: Mix 1 scoop of powder into 16 ounces of water and drink once a day      Follow-up Information    Mertie Moores, MD Follow up.   Specialty:  Cardiology Why:  The office will call you to arrange follow up and a stress test  Contact information: [place] [address] [place] [address] [phone]        Alesia Richards, MD. Schedule an  appointment as soon as possible for a visit in 1 week(s).   Specialty:  Internal Medicine Contact information: [address] [place]  [place] [address] [phone]          Allergies  Allergen Reactions  . Red Yeast Rice [Cholestin] Other (See Comments)    Dizzy  . Tape Other (See Comments)    Pt states that skin burns, and it hurts severely.  Tori Milks [Naproxen Sodium] Rash  . Darvon [Propoxyphene] Nausea And Vomiting and Anxiety  . Latex Rash    Contact makes the skin feel "burned"    Consultations:  Cardiology    Procedures/Studies: Dg Chest 2 View  Result Date: 11/15/2016 CLINICAL DATA:  Weakness, nausea, chest tightness and trembling since 10 a.m. this morning. EXAM: CHEST  2 VIEW COMPARISON:  PA and lateral chest 06/05/2014. FINDINGS: Lungs are clear. Heart size is normal. No pneumothorax or pleural fluid. Aortic atherosclerosis is seen. No bony abnormality. IMPRESSION: No acute disease. Atherosclerosis. Electronically Signed   By: Inge Rise M.D.   On: 11/15/2016 15:04      Discharge Exam: Vitals:   11/15/16 2329 11/16/16 0621  BP: (!) 126/59 (!) 117/58  Pulse:  75  Resp:  (!) 21  Temp:  98 F (36.7 C)   Vitals:   11/15/16 2133 11/15/16 2146 11/15/16 2329 11/16/16 0621  BP: (!) 130/100  (!) 126/59 (!) 117/58  Pulse: 73   75  Resp: (!) 21   (!) 21  Temp: 97.9 F (36.6 C)   98 F (36.7 C)  TempSrc: Oral   Oral  SpO2: 98% 98%  97%  Weight: 72.4 kg (159 lb 9.6 oz)     Height: 5\' 2"  (1.575 m)       General: Pt is alert, awake, not in acute distress Cardiovascular: RRR, S1/S2 +, no rubs, no gallops Respiratory: CTA bilaterally, no wheezing, no rhonchi Abdominal: Soft, NT, ND, bowel sounds + Extremities: no edema, no cyanosis    The results of significant diagnostics from this hospitalization (including imaging, microbiology, ancillary and laboratory) are listed below for reference.     Microbiology: No results found for this or  any previous visit (from the past 240 hour(s)).   Labs: BNP (last 3 results) No results for input(s): BNP in the last 8760 hours. Basic Metabolic Panel:  Recent Labs Lab 11/12/16 1536 11/15/16 1423  NA 141 138  K 4.4 3.4*  CL 104 104  CO2 27 23  GLUCOSE 102* 139*  BUN 16 11  CREATININE 0.78 0.73  CALCIUM 9.2 9.7  MG 1.9  --    Liver Function Tests:  Recent Labs Lab 11/12/16 1536  AST 16  ALT 12  ALKPHOS 75  BILITOT 0.4  PROT 6.4  ALBUMIN 4.0   No results for input(s): LIPASE, AMYLASE in the last 168 hours. No results for input(s): AMMONIA in the last 168 hours. CBC:  Recent  Labs Lab 11/12/16 1536 11/15/16 1423  WBC 5.7 6.4  NEUTROABS 2,394  --   HGB 13.8 14.5  HCT 42.2 43.6  MCV 87.4 88.1  PLT 191 173   Cardiac Enzymes:  Recent Labs Lab 11/15/16 2240 11/16/16 0545  TROPONINI <0.03 <0.03   BNP: Invalid input(s): POCBNP CBG:  Recent Labs Lab 11/16/16 0103 11/16/16 0649  GLUCAP 105* 109*   D-Dimer No results for input(s): DDIMER in the last 72 hours. Hgb A1c No results for input(s): HGBA1C in the last 72 hours. Lipid Profile No results for input(s): CHOL, HDL, LDLCALC, TRIG, CHOLHDL, LDLDIRECT in the last 72 hours. Thyroid function studies No results for input(s): TSH, T4TOTAL, T3FREE, THYROIDAB in the last 72 hours.  Invalid input(s): FREET3 Anemia work up No results for input(s): VITAMINB12, FOLATE, FERRITIN, TIBC, IRON, RETICCTPCT in the last 72 hours. Urinalysis    Component Value Date/Time   COLORURINE YELLOW 11/12/2016 1536   APPEARANCEUR CLEAR 11/12/2016 1536   LABSPEC 1.016 11/12/2016 1536   PHURINE 6.0 11/12/2016 1536   GLUCOSEU NEGATIVE 11/12/2016 1536   HGBUR NEGATIVE 11/12/2016 1536   BILIRUBINUR NEGATIVE 11/12/2016 1536   [place] 11/12/2016 1536   PROTEINUR NEGATIVE 11/12/2016 1536   UROBILINOGEN 0.2 06/27/2014 1318   NITRITE NEGATIVE 11/12/2016 1536   LEUKOCYTESUR 1+ (A) 11/12/2016 1536   Sepsis  Labs Invalid input(s): PROCALCITONIN,  WBC,  LACTICIDVEN Microbiology No results found for this or any previous visit (from the past 240 hour(s)).   Time coordinating discharge: 45 minutes  SIGNED:  Dessa Phi, DO [facility] Pager [phone]  If 7PM-7AM, please contact night-coverage [email or web address] Password [facility] 11/16/2016, 10:48 AM

## 2016-11-16 NOTE — Discharge Instructions (Signed)
° °  You will have a Stress Test scheduled at [facility]. Your doctor has ordered this test to check the blood flow in your heart arteries.  Please arrive 15 minutes early for paperwork. The whole test will take several hours. You may want to bring reading material to remain occupied while undergoing different parts of the test.  Instructions:  No food/drink after midnight the night before.  It is OK to take your morning meds with a sip of water EXCEPT for those types of medicines listed below or otherwise instructed.  No caffeine/decaf products 24 hours before, including medicines such as Excedrin or Goody Powders. Call if there are any questions.   Wear comfortable clothes and shoes.   Special Medication Instructions:  Beta blockers such as metoprolol (Lopressor/Toprol XL), atenolol (Tenormin), carvedilol (Coreg), nebivolol (Bystolic), bisoprolol (Zebeta), propranolol (Inderal) should not be taken for 24 hours before the test.  Calcium channel blockers such as diltiazem (Cardizem) or verapmil (Calan) should not be taken for 24 hours before the test.  Remove nitroglycerin patches and do not take nitrate preparations such as Imdur/isosorbide the day of your test.  No Persantine/Theophylline or Aggrenox medicines should be used within 24 hours of the test.    What To Expect: When you arrive in the lab, the technician will inject a small amount of radioactive tracer into your arm through an IV while you are resting quietly. This helps [place] to form pictures of your heart. You will likely only feel a sting from the IV. After a waiting period, resting pictures will be obtained under a big camera. These are the "before" pictures.  Next, you will be prepped for the stress portion of the test. This may include either walking on a treadmill or receiving a medicine that helps to dilate blood vessels in your heart to simulate the effect of exercise on your heart. If you are  walking on a treadmill, you will walk at different paces to try to get your heart rate to a goal number that is based on your age. If your doctor has chosen the pharmacologic test, then you will receive a medicine through your IV that may cause temporary nausea, flushing, shortness of breath and sometimes chest discomfort or vomiting. This is typically short-lived and usually resolves quickly. If you experience symptoms, that does not automatically mean the test is abnormal. Some patients do not experience any symptoms at all. Your blood pressure and heart rate will be monitored, and we will be watching your EKG on a computer screen for any changes. During this portion of the test, the radiologist will inject another small amount of radioactive tracer into your IV. After a waiting period, you will undergo a second set of pictures. These are the "after" pictures.  The doctor reading the test will compare the before-and-after images to look for evidence of heart blockages or heart weakness. The test usually takes 1 day to complete, but in certain instances (for example, if a patient is over a certain weight limit), the test may be done over the span of 2 days.

## 2016-11-18 ENCOUNTER — Telehealth: Payer: Self-pay | Admitting: *Deleted

## 2016-11-18 NOTE — Telephone Encounter (Signed)
Left message home and cell for patient to call back for Dr. Elmarie Shiley advice

## 2016-11-18 NOTE — Telephone Encounter (Signed)
-----   Message from Eileen Stanford, PA-C sent at 11/16/2016 10:43 AM EDT ----- She needs a post hospital follow up visit and nuclear stress test. Please arrange nuc (order in) and follow up with me in the office. She will be a [place] patient.

## 2016-11-18 NOTE — Telephone Encounter (Signed)
Lets do a lex[facility] myoview and and echo . She is ok to fly / travel

## 2016-11-18 NOTE — Telephone Encounter (Signed)
Exercise myoview scheduled for 11/22/16 at 8:15 and appt with K. Grandville Silos, [place] scheduled for 11/29/16 at 9:45 I placed call to pt who reports her discharge instructions state she is to be scheduled for echocardiogram. She states she was told she would have stress test only if her symptoms returned.  Will send to Dr. Acie Fredrickson for clarification. I have not scheduled follow up as pt will be out of town from 3/27-4/10.   Pt would like to know from Dr. Acie Fredrickson if he feels it is OK for her to fly on 3/27. I told pt we would call her back with instructions and to arrange follow up  once testing confirmed.

## 2016-11-18 NOTE — Telephone Encounter (Signed)
If the patient is able to walk we can do a Stress  myoview instead of a [place]

## 2016-11-19 ENCOUNTER — Telehealth (HOSPITAL_COMMUNITY): Payer: Self-pay | Admitting: *Deleted

## 2016-11-19 NOTE — Telephone Encounter (Signed)
New Message   pt verbalized that she is returning call for rn about test

## 2016-11-19 NOTE — Telephone Encounter (Signed)
I spoke with patient. She is doing very well.  She is leaving for [place] in a week Lets have her come in on Thursday  ( March 22) around 9:45 for a 10:00 appt. We may be able to cancel the stress test on Friday

## 2016-11-19 NOTE — Telephone Encounter (Signed)
I spoke with patient and advised she is ok to travel/fly.  She states she can walk on treadmill.  The patient voiced confusion about having a stress test. She states Dr. Acie Fredrickson told her she would have stress test if she had another episode like she did this past Friday.  She states she has not had another episode or symptoms since Friday.  Patient states she is going out of town 11/26/2016-12/10/2016.  She states she would like to know if the test should actually be done now or if she should wait. Please advise.

## 2016-11-19 NOTE — Telephone Encounter (Signed)
Left message on voicemail in reference to upcoming appointment scheduled for 11/22/16 Phone number given for a call back so details instructions can be given. Kirstie Peri

## 2016-11-21 ENCOUNTER — Encounter: Payer: Self-pay | Admitting: Cardiovascular Disease

## 2016-11-21 ENCOUNTER — Ambulatory Visit (INDEPENDENT_AMBULATORY_CARE_PROVIDER_SITE_OTHER): Payer: Medicare Other | Admitting: Cardiovascular Disease

## 2016-11-21 VITALS — BP 134/70 | HR 70 | Ht 62.0 in | Wt 159.4 lb

## 2016-11-21 DIAGNOSIS — R0789 Other chest pain: Secondary | ICD-10-CM

## 2016-11-21 NOTE — Patient Instructions (Signed)
Medication Instructions:  Your physician recommends that you continue on your current medications as directed. Please refer to the Current Medication list given to you today.   Labwork: None Ordered   Testing/Procedures: None Ordered   Follow-Up: Your physician wants you to follow-up in: 3-4 months with DR. Nahser.  You will receive a reminder letter in the mail two months in advance. If you don't receive a letter, please call our office to schedule the follow-up appointment.   If you need a refill on your cardiac medications before your next appointment, please call your pharmacy.   Thank you for choosing [facility]! Christen Bame, RN [phone]

## 2016-11-21 NOTE — Progress Notes (Signed)
Cardiology Office Note   Date:  11/21/2016   ID:  Regina Shaw, DOB 03-01-1951, MRN [id number]  PCP:  Regina Richards, MD  Cardiologist:   Regina Moores, MD   Chief Complaint  Patient presents with  . Chest Pain   Problem list  1. Chest pain 2. Breast cancer  ( DCIS )  3, Hyperlipidemia  4. Essential Hypertension     History of Present Illness: Regina Shaw is a 66 y.o. female who presents for follow-up visit for recent hospitalization for chest pain. She ruled out for myocardial infarction. She was discharged in satisfactory condition with close follow-up.  She was admitted with tightness in her chest .   Like a rubber band being twisted.  Had some associated nausea  Lasted for 3-4 hours Associated with lightheadedness.   Tightness resolved while in   Still works - part time at [employer].     No CP or tightness since her DC,  No DOE  Will be leaving town [facility]) next week Has been exercising .   Walks regularly   Carries the Dx of HTN but her BP has been normal for about 9 years.   Was diagnosed in 2009 when she was taking lots of Aleve. Eats a low salt diet   Was just started on Crestor 20 mg twice a week.  Has had problems with Crestor in the past      Past Medical History:  Diagnosis Date  . DCIS (ductal carcinoma in situ) of breast 04/29/2012  . HTN (hypertension)   . Hyperlipidemia   . Vitamin D deficiency     Past Surgical History:  Procedure Laterality Date  . BREAST LUMPECTOMY  09/2009   [place]    . LAMINECTOMY Right 07/18/2014   Procedure: Right Lumbar Four to Five Laminectomy for Synovial cyst;  Surgeon: Kristeen Miss, MD;  Location: [facility];  Service: Neurosurgery;  Laterality: Right;  Right L4-5 Laminectomy for Synovial cyst  . LUMBAR [facility]  . TONSILLECTOMY AND ADENOIDECTOMY  1960  . TUBAL LIGATION       Current Outpatient Prescriptions    Medication Sig Dispense Refill  . aspirin EC 81 MG tablet Take 81 mg by mouth daily.    . cetaphil (CETAPHIL) cream Apply 1 application topically daily as needed (to facial irritation).    . cholecalciferol (VITAMIN D) 1000 UNITS tablet Take 5,000 Units by mouth daily.     . Multiple Vitamin (MULTIVITAMIN) tablet Take 1 tablet by mouth daily.    . Multiple Vitamins-Minerals (ICAPS LUTEIN & OMEGA-3 PO) Take 1 capsule by mouth daily.    . rosuvastatin (CRESTOR) 40 MG tablet Take 1/2 to 1 tablet daily or as directed for Cholesterol (Patient taking differently: Take 20 mg by mouth 2 (two) times a week. MONDAYS & THURSDAYS) 30 tablet 6  . UNABLE TO FIND Herbal Life: Mix 3 scoops of powder into 8 ounces of water (including ice, 1/2 strawberry, a slice of banana) and drink every morning    . UNABLE TO FIND Herbal Life: Mix 1 scoop of powder into 16 ounces of water and drink once a day     No current facility-administered medications for this visit.     No flowsheet data found.    Allergies:   Red yeast rice [cholestin]; Tape; Aleve [naproxen sodium]; Darvon [propoxyphene]; and Latex    Social History:  The patient  reports that she quit smoking about 11 years ago. She has never used smokeless tobacco. She reports that she does not drink alcohol or use drugs.   Family History:  The patient's family history includes Alcohol abuse in her brother; COPD in her father; Heart attack (age of onset: 40) in her brother; Liver disease in her mother; Stroke (age of onset: 39) in her brother.    ROS:  Please see the history of present illness.    Review of Systems: Constitutional:  denies fever, chills, diaphoresis, appetite change and fatigue.  HEENT: denies photophobia, eye pain, redness, hearing loss, ear pain, congestion, sore throat, rhinorrhea, sneezing, neck pain, neck stiffness and tinnitus.  Respiratory: denies SOB, DOE, cough, chest tightness, and wheezing.  Cardiovascular: denies chest pain,  palpitations and leg swelling.  Gastrointestinal: denies nausea, vomiting, abdominal pain, diarrhea, constipation, blood in stool.  Genitourinary: denies dysuria, urgency, frequency, hematuria, flank pain and difficulty urinating.  Musculoskeletal: denies  myalgias, back pain, joint swelling, arthralgias and gait problem.   Skin: denies pallor, rash and wound.  Neurological: denies dizziness, seizures, syncope, weakness, light-headedness, numbness and headaches.   Hematological: denies adenopathy, easy bruising, personal or family bleeding history.  Psychiatric/ Behavioral: denies suicidal ideation, mood changes, confusion, nervousness, sleep disturbance and agitation.       All other systems are reviewed and negative.    PHYSICAL EXAM: VS:  BP 134/70 (BP Location: Right Arm, Patient Position: Sitting, Cuff Size: Large)   Pulse 70   Ht 5\' 2"  (1.575 m)   Wt 159 lb 6.4 oz (72.3 kg)   SpO2 99%   BMI 29.15 kg/m  , BMI Body mass index is 29.15 kg/m. GEN: Well nourished, well developed, in no acute distress  HEENT: normal  Neck: no JVD, carotid bruits, or masses Cardiac: RRR; no murmurs, rubs, or gallops,no edema  Respiratory:  clear to auscultation bilaterally, normal work of breathing GI: soft, nontender, nondistended, + BS MS: no deformity or atrophy  Skin: warm and dry, no rash Neuro:  Strength and sensation are intact Psych: normal  EKG:  EKG is not ordered today.  Recent Labs: 11/12/2016: ALT 12; Magnesium 1.9; TSH 0.55 11/15/2016: BUN 11; Creatinine, Ser 0.73; Hemoglobin 14.5; Platelets 173; Potassium 3.4; Sodium 138    Lipid Panel    Component Value Date/Time   CHOL 238 (H) 11/12/2016 1536   TRIG 171 (H) 11/12/2016 1536   HDL 72 11/12/2016 1536   CHOLHDL 3.3 11/12/2016 1536   VLDL 34 (H) 11/12/2016 1536   LDLCALC 132 (H) 11/12/2016 1536   LDLDIRECT 139.2 04/29/2012 1457      Wt Readings from Last 3 Encounters:  11/21/16 159 lb 6.4 oz (72.3 kg)  11/15/16 159 lb  9.6 oz (72.4 kg)  11/12/16 161 lb (73 kg)      Other studies Reviewed: Additional studies/ records that were reviewed today include: . Review of the above records demonstrates:    ASSESSMENT AND PLAN:  1.  Chest tightness:   Atypical CP / tightness. Last for 4 hours.   troponins were negative.  Now has walked many times since her DC and has not had any problems .  At this point, I think she is safe to Travel to her daughter's house in [place]. I'll see her back in 3 months. She'll call me she has any problems in the interim. We can consider doing a stress test at a later time.  2.   Hyperlipidemia :   Is trying Crestor again .  Further plans per her medical doctor    Disposition:   FU with me in 3 months      Regina Moores, MD  11/21/2016 10:58 AM    [address], [place], [place]  [address] Phone: [phone]; Fax: [phone]

## 2016-11-22 ENCOUNTER — Telehealth: Payer: Self-pay | Admitting: *Deleted

## 2016-11-22 ENCOUNTER — Encounter (HOSPITAL_COMMUNITY): Payer: Medicare Other

## 2016-11-22 NOTE — Telephone Encounter (Signed)
CALLED PT SCHEDULED A HOSP FOLLOW UP FOR 11/25/16

## 2016-11-25 ENCOUNTER — Ambulatory Visit (INDEPENDENT_AMBULATORY_CARE_PROVIDER_SITE_OTHER): Payer: Medicare Other | Admitting: Internal Medicine

## 2016-11-25 ENCOUNTER — Encounter: Payer: Self-pay | Admitting: Internal Medicine

## 2016-11-25 VITALS — BP 126/66 | HR 88 | Temp 97.5°F | Resp 16 | Ht 62.0 in | Wt 160.0 lb

## 2016-11-25 DIAGNOSIS — E782 Mixed hyperlipidemia: Secondary | ICD-10-CM

## 2016-11-25 DIAGNOSIS — E559 Vitamin D deficiency, unspecified: Secondary | ICD-10-CM

## 2016-11-25 DIAGNOSIS — R0789 Other chest pain: Secondary | ICD-10-CM | POA: Diagnosis not present

## 2016-11-25 DIAGNOSIS — Z79899 Other long term (current) drug therapy: Secondary | ICD-10-CM | POA: Diagnosis not present

## 2016-11-25 DIAGNOSIS — R7303 Prediabetes: Secondary | ICD-10-CM

## 2016-11-25 DIAGNOSIS — R079 Chest pain, unspecified: Secondary | ICD-10-CM

## 2016-11-25 DIAGNOSIS — I10 Essential (primary) hypertension: Secondary | ICD-10-CM

## 2016-11-25 NOTE — Progress Notes (Signed)
This very nice 66 y.o. WWF  presents for post-hospital  follow up for chest pain. She was hospitalized Mar 16-17 for query chest pain. As Troponins were negative, ACS was ruled out. Initial EKG was suspicious for minor T-wave inversions, but f/u EKG was normal. Since hospitalization, she reports she has been fairly active physically w/o any CP or dyspnea. She was seen in by Dr Acie Fredrickson on 3/22 in his office and he deferred any cardiac work-up until after she returns form a 2-3 week tip to visit her daughter in [place].      Hospital discharge medications were reconciled with the patient.  Patient also has been followed with labile Hypertension since 2009, Hyperlipidemia, Pre-Diabetes and Vitamin D Deficiency.      Patient's BP has been controlled and today's BP is at goal -126/66. Patient has had no complaints of any cardiac type chest pain, palpitations, dyspnea/orthopnea/PND, dizziness, claudication, or dependent edema.     Hyperlipidemia is not controlled with diet & she has recently been re-prescribed Crestor to begin at a low dose alternate day schedule. Patient denies myalgias or other med SE's. Last Lipids were  Lab Results  Component Value Date   CHOL 238 (H) 11/12/2016   HDL 72 11/12/2016   LDLCALC 132 (H) 11/12/2016   LDLDIRECT 139.2 04/29/2012   TRIG 171 (H) 11/12/2016   CHOLHDL 3.3 11/12/2016      Also, the patient has history of PreDiabetes and has had no symptoms of reactive hypoglycemia, diabetic polys, paresthesias or visual blurring.  Last A1c was  Lab Results  Component Value Date   HGBA1C 5.5 11/12/2016      Further, the patient also has history of Vitamin D Deficiency and supplements vitamin D without any suspected side-effects. Last vitamin D was still low and she was advised to increase her dose.  Lab Results  Component Value Date   VD25OH 42 11/12/2016   Current Outpatient Prescriptions on File Prior to Visit  Medication Sig  . aspirin EC 81 MG tablet Take  81 mg by mouth daily.  . cetaphil (CETAPHIL) cream Apply 1 application topically daily as needed (to facial irritation).  . cholecalciferol (VITAMIN D) 1000 UNITS tablet Take 5,000 Units by mouth daily.   . Multiple Vitamin (MULTIVITAMIN) tablet Take 1 tablet by mouth daily.  . Multiple Vitamins-Minerals (ICAPS LUTEIN & OMEGA-3 PO) Take 1 capsule by mouth daily.  . rosuvastatin (CRESTOR) 40 MG tablet Take 1/2 to 1 tablet daily or as directed for Cholesterol (Patient taking differently: Take 20 mg by mouth 2 (two) times a week. [place])  . UNABLE TO FIND Herbal Life: Mix 3 scoops of powder into 8 ounces of water (including ice, 1/2 strawberry, a slice of banana) and drink every morning  . UNABLE TO FIND Herbal Life: Mix 1 scoop of powder into 16 ounces of water and drink once a day   No current facility-administered medications on file prior to visit.    Allergies  Allergen Reactions  . Red Yeast Rice [Cholestin] Other (See Comments)    Dizzy  . Tape Other (See Comments)    Pt states that skin burns, and it hurts severely.  Tori Milks [Naproxen Sodium] Rash  . Darvon [Propoxyphene] Nausea And Vomiting and Anxiety  . Latex Rash    Contact makes the skin feel "burned"   PMHx:   Past Medical History:  Diagnosis Date  . DCIS (ductal carcinoma in situ) of breast 04/29/2012  . HTN (  hypertension)   . Hyperlipidemia   . Vitamin D deficiency    Immunization History  Administered Date(s) Administered  . Influenza, High Dose Seasonal PF 05/05/2014, 06/15/2015, 06/04/2016  . PPD Test 10/18/2013  . Pneumococcal Conjugate-13 05/05/2014  . Pneumococcal Polysaccharide-23 10/18/2014  . Tdap 10/18/2014  . Zoster 11/06/2009   Past Surgical History:  Procedure Laterality Date  . BREAST LUMPECTOMY  09/2009   [place]    . LAMINECTOMY Right 07/18/2014   Procedure: Right Lumbar Four to Five Laminectomy for Synovial cyst;  Surgeon: Kristeen Miss, MD;  Location: [facility];  Service: Neurosurgery;  Laterality: Right;  Right L4-5 Laminectomy for Synovial cyst  . LUMBAR [facility]  . TONSILLECTOMY AND ADENOIDECTOMY  1960  . TUBAL LIGATION     FHx:    Reviewed / unchanged  SHx:    Reviewed / unchanged  Systems Review:  Constitutional: Denies fever, chills, wt changes, headaches, insomnia, fatigue, night sweats, change in appetite. Eyes: Denies redness, blurred vision, diplopia, discharge, itchy, watery eyes.  ENT: Denies discharge, congestion, post nasal drip, epistaxis, sore throat, earache, hearing loss, dental pain, tinnitus, vertigo, sinus pain, snoring.  CV: Denies chest pain, palpitations, irregular heartbeat, syncope, dyspnea, diaphoresis, orthopnea, PND, claudication or edema. Respiratory: denies cough, dyspnea, DOE, pleurisy, hoarseness, laryngitis, wheezing.  Gastrointestinal: Denies dysphagia, odynophagia, heartburn, reflux, water brash, abdominal pain or cramps, nausea, vomiting, bloating, diarrhea, constipation, hematemesis, melena, hematochezia  or hemorrhoids. Genitourinary: Denies dysuria, frequency, urgency, nocturia, hesitancy, discharge, hematuria or flank pain. Musculoskeletal: Denies arthralgias, myalgias, stiffness, jt. swelling, pain, limping or strain/sprain.  Skin: Denies pruritus, rash, hives, warts, acne, eczema or change in skin lesion(s). Neuro: No weakness, tremor, incoordination, spasms, paresthesia or pain. Psychiatric: Denies confusion, memory loss or sensory loss. Endo: Denies change in weight, skin or hair change.  Heme/Lymph: No excessive bleeding, bruising or enlarged lymph nodes.  Physical Exam  BP 126/66   Pulse 88   Temp 97.5 F (36.4 C)   Resp 16   Ht 5\' 2"  (1.575 m)   Wt 160 lb (72.6 kg)   BMI 29.26 kg/m   Appears well nourished and in no distress.  Eyes: PERRLA, EOMs, conjunctiva no swelling or erythema. Sinuses: No frontal/maxillary tenderness ENT/Mouth: EAC's clear, TM's  nl w/o erythema, bulging. Nares clear w/o erythema, swelling, exudates. Oropharynx clear without erythema or exudates. Oral hygiene is good. Tongue normal, non obstructing. Hearing intact.  Neck: Supple. Thyroid nl. Car 2+/2+ without bruits, nodes or JVD. Chest: Respirations nl with BS clear & equal w/o rales, rhonchi, wheezing or stridor.  Cor: Heart sounds normal w/ regular rate and rhythm without sig. murmurs, gallops, clicks, or rubs. Peripheral pulses normal and equal  without edema.  Abdomen: Soft & bowel sounds normal. Non-tender w/o guarding, rebound, hernias, masses, or organomegaly.  Lymphatics: Unremarkable.  Musculoskeletal: Full ROM all peripheral extremities, joint stability, 5/5 strength, and normal gait.  Skin: Warm, dry without exposed rashes, lesions or ecchymosis apparent.  Neuro: Cranial nerves intact, reflexes equal bilaterally. Sensory-motor testing grossly intact. Tendon reflexes grossly intact.  Pysch: Alert & oriented x 3.  Insight and judgement nl & appropriate. No ideations.  Assessment and Plan:  1. Chest pain of uncertain etiology  - Reminder to go to the ER if any CP,  SOB, nausea, dizziness, severe HA, changes vision/speech,  left arm numbness and tingling and jaw pain.  2. Essential hypertension  - Continue medication, monitor blood pressure  at home.  - Continue DASH diet.   3. Mixed hyperlipidemia  - Continue diet/meds, exercise,& lifestyle modifications.  - Continue monitor periodic cholesterol/liver & renal functions   4. Prediabetes  - Continue diet, exercise, lifestyle modifications.  - Monitor appropriate labs.  5. Vitamin D deficiency  - Continue supplementation.  6. Medication management       Recommended regular exercise, BP monitoring, weight control, and discussed med and SE's. Recommended labs to assess and monitor clinical status. Further disposition pending results of labs. Over 40 minutes of exam, counseling, chart review and  high complex critical decision making was performed

## 2016-11-25 NOTE — Patient Instructions (Signed)
Chest Wall Pain °Chest wall pain is pain in or around the bones and muscles of your chest. Sometimes, an injury causes this pain. Sometimes, the cause may not be known. This pain may take several weeks or longer to get better. °Follow these instructions at home: °Pay attention to any changes in your symptoms. Take these actions to help with your pain: °· Rest as told by your health care provider. °· Avoid activities that cause pain. These include any activities that use your chest muscles or your abdominal and side muscles to lift heavy items. °· If directed, apply ice to the painful area: °¨ Put ice in a plastic bag. °¨ Place a towel between your skin and the bag. °¨ Leave the ice on for 20 minutes, 2-3 times per day. °· Take over-the-counter and prescription medicines only as told by your health care provider. °· Do not use tobacco products, including cigarettes, chewing tobacco, and e-cigarettes. If you need help quitting, ask your health care provider. °· Keep all follow-up visits as told by your health care provider. This is important. °Contact a health care provider if: °· You have a fever. °· Your chest pain becomes worse. °· You have new symptoms. °Get help right away if: °· You have nausea or vomiting. °· You feel sweaty or light-headed. °· You have a cough with phlegm (sputum) or you cough up blood. °· You develop shortness of breath. °This information is not intended to replace advice given to you by your health care provider. Make sure you discuss any questions you have with your health care provider. °Document Released: 08/19/2005 Document Revised: 12/28/2015 Document Reviewed: 11/14/2014 °Elsevier Interactive Patient Education © 2017 [employer] ° °

## 2016-11-27 ENCOUNTER — Telehealth (HOSPITAL_COMMUNITY): Payer: Self-pay | Admitting: Physician Assistant

## 2016-11-27 NOTE — Telephone Encounter (Signed)
Appt on 3/23 was cancelled  Cancel Rsn: Provider (test not needed per Dr. Acie Fredrickson)

## 2016-11-29 ENCOUNTER — Ambulatory Visit: Payer: Medicare Other | Admitting: Physician Assistant

## 2017-01-16 LAB — HM MAMMOGRAPHY

## 2017-01-31 ENCOUNTER — Telehealth: Payer: Self-pay | Admitting: Nurse Practitioner

## 2017-01-31 NOTE — Telephone Encounter (Signed)
Left message for patient to call back regarding follow-up appointment for June or July with Dr. Acie Fredrickson

## 2017-02-05 NOTE — Telephone Encounter (Signed)
Spoke with patient who states she has been feeling well since seeing Dr. Acie Fredrickson in March. She states her PCP advised her to purchase a BP cuff and she has been monitoring at home. She states her heart rate has been 70-80 bpm and BP has been 129-135/76 mmHg. She has no complaints and does not feel that she needs to see Dr. Acie Fredrickson for follow-up at this time. I advised her to call back in the future with questions or concerns. She thanked me for the call.

## 2017-02-10 NOTE — Progress Notes (Signed)
This very nice 66 y.o. WWF presents for 3 month follow up with Hypertension, Hyperlipidemia, Pre-Diabetes and Vitamin D Deficiency.  Patient was dx'd in 2011 with CIS of the Lt Breast  tx'd by lumpectomy and subsequent Radiation.     Patient is treated for HTN (2009) & BP has been controlled at home. Today's BP is at goal - 112/62. Patient has had no complaints of any cardiac type chest pain, palpitations, dyspnea/orthopnea/PND, dizziness, claudication, or dependent edema.     Hyperlipidemia is controlled with diet & meds. Patient denies myalgias or other med SE's. Last Lipids were not at goal: Lab Results  Component Value Date   CHOL 238 (H) 11/12/2016   HDL 72 11/12/2016   LDLCALC 132 (H) 11/12/2016   LDLDIRECT 139.2 04/29/2012   TRIG 171 (H) 11/12/2016   CHOLHDL 3.3 11/12/2016      Also, the patient has history of PreDiabetes (A1c 6.3% in 2011 and 5.9% in Aug 2016) and has had no symptoms of reactive hypoglycemia, diabetic polys, paresthesias or visual blurring.  Last A1c was  Lab Results  Component Value Date   HGBA1C 5.5 11/12/2016      Further, the patient also has history of Vitamin D Deficiency("24" in 2008) and supplements vitamin D without any suspected side-effects. Last vitamin D was   Lab Results  Component Value Date   VD25OH 42 11/12/2016   Current Outpatient Prescriptions on File Prior to Visit  Medication Sig  . aspirin EC 81 MG tablet Take 81 mg by mouth daily.  . cetaphil (CETAPHIL) cream Apply 1 application topically daily as needed (to facial irritation).  . cholecalciferol (VITAMIN D) 1000 UNITS tablet Take 5,000 Units by mouth daily.   . Multiple Vitamin (MULTIVITAMIN) tablet Take 1 tablet by mouth daily.  . Multiple Vitamins-Minerals (ICAPS LUTEIN & OMEGA-3 PO) Take 1 capsule by mouth daily.  Marland Kitchen UNABLE TO FIND Herbal Life: Mix 3 scoops of powder into 8 ounces of water (including ice, 1/2 strawberry, a slice of banana) and drink every morning  . UNABLE TO  FIND Herbal Life: Mix 1 scoop of powder into 16 ounces of water and drink once a day  . rosuvastatin (CRESTOR) 40 MG tablet Take 1/2 to 1 tablet daily or as directed for Cholesterol (Patient not taking: Reported on 02/11/2017)   Allergies  Allergen Reactions  . Red Yeast Rice [Cholestin] Other (See Comments)    Dizzy  . Tape Other (See Comments)    Pt states that skin burns, and it hurts severely.  Tori Milks [Naproxen Sodium] Rash  . Darvon [Propoxyphene] Nausea And Vomiting and Anxiety  . Latex Rash    Contact makes the skin feel "burned"   PMHx:   Past Medical History:  Diagnosis Date  . DCIS (ductal carcinoma in situ) of breast 04/29/2012  . HTN (hypertension)   . Hyperlipidemia   . Vitamin D deficiency    Immunization History  Administered Date(s) Administered  . Influenza, High Dose Seasonal PF 05/05/2014, 06/15/2015, 06/04/2016  . PPD Test 10/18/2013  . Pneumococcal Conjugate-13 05/05/2014  . Pneumococcal Polysaccharide-23 10/18/2014  . Tdap 10/18/2014  . Zoster 11/06/2009   Past Surgical History:  Procedure Laterality Date  . BREAST LUMPECTOMY  09/2009   [place]    . LAMINECTOMY Right 07/18/2014   Procedure: Right Lumbar Four to Five Laminectomy for Synovial cyst;  Surgeon: Kristeen Miss, MD;  Location: [facility];  Service: Neurosurgery;  Laterality: Right;  Right L4-5 Laminectomy for Synovial cyst  . LUMBAR DISC SURGERY     [facility]  . TONSILLECTOMY AND ADENOIDECTOMY  1960  . TUBAL LIGATION     FHx:    Reviewed / unchanged  SHx:    Reviewed / unchanged  Systems Review:  Constitutional: Denies fever, chills, wt changes, headaches, insomnia, fatigue, night sweats, change in appetite. Eyes: Denies redness, blurred vision, diplopia, discharge, itchy, watery eyes.  ENT: Denies discharge, congestion, post nasal drip, epistaxis, sore throat, earache, hearing loss, dental pain, tinnitus, vertigo, sinus pain, snoring.  CV: Denies chest  pain, palpitations, irregular heartbeat, syncope, dyspnea, diaphoresis, orthopnea, PND, claudication or edema. Respiratory: denies cough, dyspnea, DOE, pleurisy, hoarseness, laryngitis, wheezing.  Gastrointestinal: Denies dysphagia, odynophagia, heartburn, reflux, water brash, abdominal pain or cramps, nausea, vomiting, bloating, diarrhea, constipation, hematemesis, melena, hematochezia  or hemorrhoids. Genitourinary: Denies dysuria, frequency, urgency, nocturia, hesitancy, discharge, hematuria or flank pain. Musculoskeletal: Denies arthralgias, myalgias, stiffness, jt. swelling, pain, limping or strain/sprain.  Skin: Denies pruritus, rash, hives, warts, acne, eczema or change in skin lesion(s). Neuro: No weakness, tremor, incoordination, spasms, paresthesia or pain. Psychiatric: Denies confusion, memory loss or sensory loss. Endo: Denies change in weight, skin or hair change.  Heme/Lymph: No excessive bleeding, bruising or enlarged lymph nodes.  Physical Exam  BP 112/62   Pulse 60   Temp 97 F (36.1 C)   Resp 16   Ht 5\' 2"  (1.575 m)   Wt 164 lb 9.6 oz (74.7 kg)   BMI 30.11 kg/m   Appears well nourished, well groomed  and in no distress.  Eyes: PERRLA, EOMs, conjunctiva no swelling or erythema. Sinuses: No frontal/maxillary tenderness ENT/Mouth: EAC's clear, TM's nl w/o erythema, bulging. Nares clear w/o erythema, swelling, exudates. Oropharynx clear without erythema or exudates. Oral hygiene is good. Tongue normal, non obstructing. Hearing intact.  Neck: Supple. Thyroid nl. Car 2+/2+ without bruits, nodes or JVD. Chest: Respirations nl with BS clear & equal w/o rales, rhonchi, wheezing or stridor.  Cor: Heart sounds normal w/ regular rate and rhythm without sig. murmurs, gallops, clicks or rubs. Peripheral pulses normal and equal  without edema.  Abdomen: Soft & bowel sounds normal. Non-tender w/o guarding, rebound, hernias, masses or organomegaly.  Lymphatics: Unremarkable.    Musculoskeletal: Full ROM all peripheral extremities, joint stability, 5/5 strength and normal gait.  Skin: Warm, dry without exposed rashes, lesions or ecchymosis apparent.  Neuro: Cranial nerves intact, reflexes equal bilaterally. Sensory-motor testing grossly intact. Tendon reflexes grossly intact.  Pysch: Alert & oriented x 3.  Insight and judgement nl & appropriate. No ideations.  Assessment and Plan:  1. Essential hypertension  - Continue medication, monitor blood pressure at home.  - Continue DASH diet. Reminder to go to the ER if any CP,  SOB, nausea, dizziness, severe HA, changes vision/speech,  left arm numbness and tingling and jaw pain. - CBC with Differential/Platelet - BASIC METABOLIC PANEL WITH GFR - Magnesium - TSH  2. Hyperlipidemia, mixed  - Continue diet/meds, exercise,& lifestyle modifications.  - Continue monitor periodic cholesterol/liver & renal functions   - Hepatic function panel - Lipid panel - TSH  3. Prediabetes  - Continue diet, exercise, lifestyle modifications.  - Monitor appropriate labs.  - Hemoglobin A1c - Insulin, random  4. Vitamin D deficiency  - Continue supplementation.  - VITAMIN D 25 Hydroxy   5. Medication management  - CBC with Differential/Platelet - BASIC METABOLIC PANEL WITH GFR - Hepatic function panel - Magnesium - Lipid panel -  TSH - Hemoglobin A1c - Insulin, random - VITAMIN D 25 Hydroxy        Discussed  regular exercise, BP monitoring, weight control to achieve/maintain BMI less than 25 and discussed med and SE's. Recommended labs to assess and monitor clinical status with further disposition pending results of labs. Over 30 minutes of exam, counseling, chart review was performed.

## 2017-02-10 NOTE — Patient Instructions (Signed)

## 2017-02-11 ENCOUNTER — Encounter: Payer: Self-pay | Admitting: Internal Medicine

## 2017-02-11 ENCOUNTER — Ambulatory Visit (INDEPENDENT_AMBULATORY_CARE_PROVIDER_SITE_OTHER): Payer: Medicare Other | Admitting: Internal Medicine

## 2017-02-11 VITALS — BP 112/62 | HR 60 | Temp 97.0°F | Resp 16 | Ht 62.0 in | Wt 164.6 lb

## 2017-02-11 DIAGNOSIS — E559 Vitamin D deficiency, unspecified: Secondary | ICD-10-CM | POA: Diagnosis not present

## 2017-02-11 DIAGNOSIS — R7303 Prediabetes: Secondary | ICD-10-CM | POA: Diagnosis not present

## 2017-02-11 DIAGNOSIS — Z79899 Other long term (current) drug therapy: Secondary | ICD-10-CM | POA: Diagnosis not present

## 2017-02-11 DIAGNOSIS — E782 Mixed hyperlipidemia: Secondary | ICD-10-CM | POA: Diagnosis not present

## 2017-02-11 DIAGNOSIS — I10 Essential (primary) hypertension: Secondary | ICD-10-CM

## 2017-02-11 LAB — CBC WITH DIFFERENTIAL/PLATELET
Basophils Absolute: 56 cells/uL (ref 0–200)
Basophils Relative: 1 %
Eosinophils Absolute: 112 cells/uL (ref 15–500)
Eosinophils Relative: 2 %
HCT: 41 % (ref 35.0–45.0)
Hemoglobin: 13.2 g/dL (ref 11.7–15.5)
Lymphocytes Relative: 47 %
Lymphs Abs: 2632 cells/uL (ref 850–3900)
MCH: 28.2 pg (ref 27.0–33.0)
MCHC: 32.2 g/dL (ref 32.0–36.0)
MCV: 87.6 fL (ref 80.0–100.0)
MPV: 10.6 fL (ref 7.5–12.5)
Monocytes Absolute: 392 cells/uL (ref 200–950)
Monocytes Relative: 7 %
Neutro Abs: 2408 cells/uL (ref 1500–7800)
Neutrophils Relative %: 43 %
Platelets: 187 10*3/uL (ref 140–400)
RBC: 4.68 MIL/uL (ref 3.80–5.10)
RDW: 13.9 % (ref 11.0–15.0)
WBC: 5.6 10*3/uL (ref 3.8–10.8)

## 2017-02-11 LAB — TSH: TSH: 0.79 mIU/L

## 2017-02-12 LAB — BASIC METABOLIC PANEL WITH GFR
BUN: 11 mg/dL (ref 7–25)
CO2: 25 mmol/L (ref 20–31)
Calcium: 9.1 mg/dL (ref 8.6–10.4)
Chloride: 105 mmol/L (ref 98–110)
Creat: 0.78 mg/dL (ref 0.50–0.99)
GFR, Est African American: >89 mL/min (ref >=60)
GFR, Est Non African American: 79 mL/min (ref >=60)
Glucose, Bld: 95 mg/dL (ref 65–99)
Potassium: 4.5 mmol/L (ref 3.5–5.3)
Sodium: 140 mmol/L (ref 135–146)

## 2017-02-12 LAB — LIPID PANEL
Cholesterol: 231 mg/dL — ABNORMAL HIGH (ref <200)
HDL: 72 mg/dL (ref >50)
LDL Cholesterol: 133 mg/dL — ABNORMAL HIGH (ref <100)
Total CHOL/HDL Ratio: 3.2 Ratio (ref <5.0)
Triglycerides: 132 mg/dL (ref <150)
VLDL: 26 mg/dL (ref <30)

## 2017-02-12 LAB — VITAMIN D 25 HYDROXY (VIT D DEFICIENCY, FRACTURES): Vit D, 25-Hydroxy: 41 ng/mL (ref 30–100)

## 2017-02-12 LAB — HEPATIC FUNCTION PANEL
ALT: 12 U/L (ref 6–29)
AST: 15 U/L (ref 10–35)
Albumin: 3.7 g/dL (ref 3.6–5.1)
Alkaline Phosphatase: 78 U/L (ref 33–130)
Bilirubin, Direct: 0.1 mg/dL (ref <=0.2)
Indirect Bilirubin: 0.2 mg/dL (ref 0.2–1.2)
Total Bilirubin: 0.3 mg/dL (ref 0.2–1.2)
Total Protein: 6.2 g/dL (ref 6.1–8.1)

## 2017-02-12 LAB — MAGNESIUM: Magnesium: 2.1 mg/dL (ref 1.5–2.5)

## 2017-02-12 LAB — HEMOGLOBIN A1C
Hgb A1c MFr Bld: 5.7 % — ABNORMAL HIGH (ref <5.7)
Mean Plasma Glucose: 117 mg/dL

## 2017-02-12 LAB — INSULIN, RANDOM: Insulin: 11.7 u[IU]/mL (ref 2.0–19.6)

## 2017-02-17 ENCOUNTER — Other Ambulatory Visit: Payer: Self-pay | Admitting: *Deleted

## 2017-02-17 MED ORDER — EZETIMIBE 10 MG PO TABS: 10.0000 mg | ORAL_TABLET | Freq: Every day | ORAL | 1 refills | Status: DC

## 2017-05-14 NOTE — Progress Notes (Deleted)
Assessment and Plan:  Hypertension:  -Continue medication,  -monitor blood pressure at home.  -Continue DASH diet.   -Reminder to go to the ER if any CP, SOB, nausea, dizziness, severe HA, changes vision/speech, left arm numbness and tingling, and jaw pain.  Cholesterol: -Continue diet and exercise.  -Check cholesterol.   Pre-diabetes: -Continue diet and exercise.  -Check A1C  Vitamin D Def: -check level -continue medications.     Continue diet and meds as discussed. Further disposition pending results of labs.  HPI 66 y.o. female  presents for 3 month follow up with hypertension, hyperlipidemia, prediabetes and vitamin D.   Her blood pressure has been controlled at home, today their BP is  .   She does workout. She denies chest pain, shortness of breath, dizziness.   She is not on cholesterol medication and denies myalgias. Her cholesterol is not at goal. The cholesterol last visit was:   Lab Results  Component Value Date   CHOL 231 (H) 02/11/2017   HDL 72 02/11/2017   LDLCALC 133 (H) 02/11/2017   LDLDIRECT 139.2 04/29/2012   TRIG 132 02/11/2017   CHOLHDL 3.2 02/11/2017  She feels that she cannot tolerate statin drugs or red yeast rice.  She did start taking fish oil which she feels is helping with her vision and also to help her cholesterol.    She has been working on diet and exercise for prediabetes, and denies foot ulcerations, hyperglycemia, hypoglycemia , increased appetite, nausea, paresthesia of the feet, polydipsia, polyuria, visual disturbances, vomiting and weight loss. Last A1C in the office was:  Lab Results  Component Value Date   HGBA1C 5.7 (H) 02/11/2017    Patient is on Vitamin D supplement.  Lab Results  Component Value Date   VD25OH 41 02/11/2017     BMI is There is no height or weight on file to calculate BMI., she is working on diet and exercise. Wt Readings from Last 3 Encounters:  02/11/17 164 lb 9.6 oz (74.7 kg)  11/25/16 160 lb (72.6  kg)  11/21/16 159 lb 6.4 oz (72.3 kg)      Current Medications:  Current Outpatient Prescriptions on File Prior to Visit  Medication Sig Dispense Refill  . aspirin EC 81 MG tablet Take 81 mg by mouth daily.    . cetaphil (CETAPHIL) cream Apply 1 application topically daily as needed (to facial irritation).    . cholecalciferol (VITAMIN D) 1000 UNITS tablet Take 5,000 Units by mouth daily.     Marland Kitchen ezetimibe (ZETIA) 10 MG tablet Take 1 tablet (10 mg total) by mouth daily. 90 tablet 1  . Multiple Vitamin (MULTIVITAMIN) tablet Take 1 tablet by mouth daily.    . Multiple Vitamins-Minerals (ICAPS LUTEIN & OMEGA-3 PO) Take 1 capsule by mouth daily.    Marland Kitchen UNABLE TO FIND Herbal Life: Mix 3 scoops of powder into 8 ounces of water (including ice, 1/2 strawberry, a slice of banana) and drink every morning    . UNABLE TO FIND Herbal Life: Mix 1 scoop of powder into 16 ounces of water and drink once a day     No current facility-administered medications on file prior to visit.     Medical History:  Past Medical History:  Diagnosis Date  . DCIS (ductal carcinoma in situ) of breast 04/29/2012  . HTN (hypertension)   . Hyperlipidemia   . Vitamin D deficiency     Allergies:  Allergies  Allergen Reactions  . Crestor [Rosuvastatin Calcium] Other (See Comments)  myalgias  . Red Yeast Rice [Cholestin] Other (See Comments)    Dizzy  . Tape Other (See Comments)    Pt states that skin burns, and it hurts severely.  Tori Milks [Naproxen Sodium] Rash  . Darvon [Propoxyphene] Nausea And Vomiting and Anxiety  . Latex Rash    Contact makes the skin feel "burned"     Review of Systems:  Review of Systems  Constitutional: Negative for chills, fever and malaise/fatigue.  HENT: Negative for congestion, ear pain and sore throat.   Eyes: Negative for blurred vision.  Respiratory: Negative for cough, shortness of breath and wheezing.   Cardiovascular: Negative for chest pain, palpitations and leg swelling.   Gastrointestinal: Negative for blood in stool, constipation, diarrhea, heartburn and melena.  Genitourinary: Negative.   Skin: Negative.   Neurological: Negative for dizziness, sensory change, loss of consciousness and headaches.  Psychiatric/Behavioral: Negative for depression. The patient is not nervous/anxious and does not have insomnia.     Family history- Review and unchanged  Social history- Review and unchanged  Physical Exam: There were no vitals taken for this visit. Wt Readings from Last 3 Encounters:  02/11/17 164 lb 9.6 oz (74.7 kg)  11/25/16 160 lb (72.6 kg)  11/21/16 159 lb 6.4 oz (72.3 kg)    General Appearance: Well nourished well developed, in no apparent distress. Eyes: PERRLA, EOMs, conjunctiva no swelling or erythema ENT/Mouth: Ear canals normal without obstruction, swelling, erythma, discharge.  TMs normal bilaterally.  Oropharynx moist, clear, without exudate, or postoropharyngeal swelling. Neck: Supple, thyroid normal,no cervical adenopathy  Respiratory: Respiratory effort normal, Breath sounds clear A&P without rhonchi, wheeze, or rale.  No retractions, no accessory usage. Cardio: RRR with no MRGs. Brisk peripheral pulses without edema.  Abdomen: Soft, + BS,  Non tender, no guarding, rebound, hernias, masses. Musculoskeletal: Full ROM, 5/5 strength, Normal gait Skin: Warm, dry without rashes, lesions, ecchymosis.  Neuro: Awake and oriented X 3, Cranial nerves intact. Normal muscle tone, no cerebellar symptoms. Psych: Normal affect, Insight and Judgment appropriate.    Vicie Mutters, PA-C 7:41 AM [facility]

## 2017-05-15 ENCOUNTER — Ambulatory Visit: Payer: Self-pay | Admitting: Physician Assistant

## 2017-06-17 ENCOUNTER — Encounter: Payer: Self-pay | Admitting: Internal Medicine

## 2017-06-17 ENCOUNTER — Ambulatory Visit (INDEPENDENT_AMBULATORY_CARE_PROVIDER_SITE_OTHER): Payer: Medicare Other | Admitting: Internal Medicine

## 2017-06-17 VITALS — BP 126/64 | HR 72 | Temp 97.5°F | Resp 16 | Ht 62.0 in | Wt 161.6 lb

## 2017-06-17 DIAGNOSIS — E559 Vitamin D deficiency, unspecified: Secondary | ICD-10-CM | POA: Diagnosis not present

## 2017-06-17 DIAGNOSIS — Z23 Encounter for immunization: Secondary | ICD-10-CM

## 2017-06-17 DIAGNOSIS — R7303 Prediabetes: Secondary | ICD-10-CM

## 2017-06-17 DIAGNOSIS — I10 Essential (primary) hypertension: Secondary | ICD-10-CM

## 2017-06-17 DIAGNOSIS — E782 Mixed hyperlipidemia: Secondary | ICD-10-CM

## 2017-06-17 DIAGNOSIS — D485 Neoplasm of uncertain behavior of skin: Secondary | ICD-10-CM

## 2017-06-17 DIAGNOSIS — Z79899 Other long term (current) drug therapy: Secondary | ICD-10-CM

## 2017-06-17 NOTE — Patient Instructions (Signed)

## 2017-06-17 NOTE — Progress Notes (Signed)
This very nice 66 y.o. WWF presents for 3 month follow up with Hypertension, Hyperlipidemia, Pre-Diabetes and Vitamin D Deficiency. She also relates concerns about a raised pruritic lesion of her Left cheek which seems to be increasing in size. Patient also has hx/o CIS of the Lt Breast  (2011) tx'd by lumpectomy and subsequentRadiation. Dexa BMD showed osteopenia and MGM was negative in May 2018 at [facility].  Cologard in June 2016 was Negative.      Patient is monitored expectantly for labile HTN circa 2009  & BP has been controlled at home. Today's BP is at goal - 126/64. Patient has had no complaints of any cardiac type chest pain, palpitations, dyspnea / orthopnea / PND, dizziness, claudication, or dependent edema.     Hyperlipidemia is not controlled with diet & she is statin Intolerant . She was Rx'd Zetia and alleged it also caused muscle soreness, but she was advised that that was unlikely due to the Zetia as it is less than 1% absorbed and she was asked to retry it. Patient denies myalgias or other med SE's. Last Lipids were not at goal: Lab Results  Component Value Date   CHOL 231 (H) 02/11/2017   HDL 72 02/11/2017   LDLCALC 133 (H) 02/11/2017   LDLDIRECT 139.2 04/29/2012   TRIG 132 02/11/2017   CHOLHDL 3.2 02/11/2017      Also, the patient has history of PreDiabetes (A1c 6.3%  / 2008 and 5.9% / 2016)  and has had no symptoms of reactive hypoglycemia, diabetic polys, paresthesias or visual blurring.  Last A1c was almost to goal: Lab Results  Component Value Date   HGBA1C 5.7 (H) 02/11/2017      Further, the patient also has history of Vitamin D Deficiency of "24" in 2008 and does not supplement vitamin D due to c/o heartburn/indigestion. Last vitamin D was still low: Lab Results  Component Value Date   VD25OH 41 02/11/2017   Current Outpatient Prescriptions on File Prior to Visit  Medication Sig  . aspirin EC 81 MG tablet Take 81 mg by mouth daily.  . Multiple Vitamin  (MULTIVITAMIN) tablet Take 1 tablet by mouth daily.  . Multiple Vitamins-Minerals (ICAPS LUTEIN & OMEGA-3 PO) Take 1 capsule by mouth daily.  . cholecalciferol (VITAMIN D) 1000 UNITS tablet Take 5,000 Units by mouth daily.   Marland Kitchen ezetimibe (ZETIA) 10 MG tablet Take 1 tablet (10 mg total) by mouth daily. (Patient not taking: Reported on 06/17/2017)   No current facility-administered medications on file prior to visit.    Allergies  Allergen Reactions  . Crestor [Rosuvastatin Calcium] Other (See Comments)    myalgias  . Red Yeast Rice [Cholestin] Other (See Comments)    Dizzy  . Tape Other (See Comments)    Pt states that skin burns, and it hurts severely.  Tori Milks [Naproxen Sodium] Rash  . Darvon [Propoxyphene] Nausea And Vomiting and Anxiety  . Latex Rash    Contact makes the skin feel "burned"   PMHx:   Past Medical History:  Diagnosis Date  . DCIS (ductal carcinoma in situ) of breast 04/29/2012  . HTN (hypertension)   . Hyperlipidemia   . Vitamin D deficiency    Immunization History  Administered Date(s) Administered  . Influenza, High Dose Seasonal PF 05/05/2014, 06/15/2015, 06/04/2016  . PPD Test 10/18/2013  . Pneumococcal Conjugate-13 05/05/2014  . Pneumococcal Polysaccharide-23 10/18/2014  . Tdap 10/18/2014  . Zoster 11/06/2009   Past Surgical History:  Procedure  Laterality Date  . BREAST LUMPECTOMY  09/2009   [place]    . LAMINECTOMY Right 07/18/2014   Procedure: Right Lumbar Four to Five Laminectomy for Synovial cyst;  Surgeon: Kristeen Miss, MD;  Location: [place] [facility];  Service: Neurosurgery;  Laterality: Right;  Right L4-5 Laminectomy for Synovial cyst  . LUMBAR [facility]  . TONSILLECTOMY AND ADENOIDECTOMY  1960  . TUBAL LIGATION     FHx:    Reviewed / unchanged  SHx:    Reviewed / unchanged  Systems Review:  Constitutional: Denies fever, chills, wt changes, headaches, insomnia, fatigue, night sweats, change in  appetite. Eyes: Denies redness, blurred vision, diplopia, discharge, itchy, watery eyes.  ENT: Denies discharge, congestion, post nasal drip, epistaxis, sore throat, earache, hearing loss, dental pain, tinnitus, vertigo, sinus pain, snoring.  CV: Denies chest pain, palpitations, irregular heartbeat, syncope, dyspnea, diaphoresis, orthopnea, PND, claudication or edema. Respiratory: denies cough, dyspnea, DOE, pleurisy, hoarseness, laryngitis, wheezing.  Gastrointestinal: Denies dysphagia, odynophagia, heartburn, reflux, water brash, abdominal pain or cramps, nausea, vomiting, bloating, diarrhea, constipation, hematemesis, melena, hematochezia  or hemorrhoids. Genitourinary: Denies dysuria, frequency, urgency, nocturia, hesitancy, discharge, hematuria or flank pain. Musculoskeletal: Denies arthralgias, myalgias, stiffness, jt. swelling, pain, limping or strain/sprain.  Skin: Denies pruritus, rash, hives, warts, acne, eczema or change in skin lesion(s). Neuro: No weakness, tremor, incoordination, spasms, paresthesia or pain. Psychiatric: Denies confusion, memory loss or sensory loss. Endo: Denies change in weight, skin or hair change.  Heme/Lymph: No excessive bleeding, bruising or enlarged lymph nodes.  Physical Exam  BP 126/64   Pulse 72   Temp (!) 97.5 F (36.4 C)   Resp 16   Ht 5\' 2"  (1.575 m)   Wt 161 lb 9.6 oz (73.3 kg)   BMI 29.56 kg/m   Appears well nourished, well groomed  and in no distress.  Eyes: PERRLA, EOMs, conjunctiva no swelling or erythema. Sinuses: No frontal/maxillary tenderness ENT/Mouth: EAC's clear, TM's nl w/o erythema, bulging. Nares clear w/o erythema, swelling, exudates. Oropharynx clear without erythema or exudates. Oral hygiene is good. Tongue normal, non obstructing. Hearing intact.  Neck: Supple. Thyroid nl. Car 2+/2+ without bruits, nodes or JVD. Chest: Respirations nl with BS clear & equal w/o rales, rhonchi, wheezing or stridor.  Cor: Heart sounds  normal w/ regular rate and rhythm without sig. murmurs, gallops, clicks or rubs. Peripheral pulses normal and equal  without edema.  Abdomen: Soft & bowel sounds normal. Non-tender w/o guarding, rebound, hernias, masses or organomegaly.  Lymphatics: Unremarkable.  Musculoskeletal: Full ROM all peripheral extremities, joint stability, 5/5 strength and normal gait.  Skin: Warm, dry without exposed rashes, lesions or ecchymosis apparent.   There is a firm pink sl irregular exophytic 4 x 4 mm lesion of the Left cheek - zygoma area.   Neuro: Cranial nerves intact, reflexes equal bilaterally. Sensory-motor testing grossly intact. Tendon reflexes grossly intact.  Pysch: Alert & oriented x 3.  Insight and judgement nl & appropriate. No ideations.  Assessment and Plan:  1. Essential hypertension  - Continue medication, monitor blood pressure at home.  - Continue DASH diet. Reminder to go to the ER if any CP,  SOB, nausea, dizziness, severe HA, changes vision/speech.  - CBC with Differential/Platelet - BASIC METABOLIC PANEL WITH GFR - Magnesium - TSH  2. Hyperlipidemia, mixed  - Continue diet/meds, exercise,& lifestyle modifications.  - Continue monitor periodic cholesterol/liver & renal functions   - Hepatic function panel -  Lipid panel - TSH  3. Prediabetes  - Continue diet, exercise, lifestyle modifications.  - Monitor appropriate labs.  - Hemoglobin A1c - Insulin, random  4. Vitamin D deficiency  - Continue supplementation.  - VITAMIN D 25 Hydroxy  5. Neoplasm of uncertain behavior of cheek, Left  - Excised by shave excision for Path Analysis (Procedure (CPT 751700)  6. Need for immunization against influenza  - Flu vaccine HIGH DOSE PF (Fluzone High dose)  7. Medication management  - CBC with Differential/Platelet - Hepatic function panel - Magnesium - Lipid panel - TSH - Hemoglobin A1c - Insulin, random - VITAMIN D 25 Hydroxy        Discussed  regular  exercise, BP monitoring, weight control to achieve/maintain BMI less than 25 and discussed med and SE's. Recommended labs to assess and monitor clinical status with further disposition pending results of labs. Over 30 minutes of exam, counseling, chart review was performed. +++++++++++++++++++++++++++++++++++++++++++++++++++++++++++++++++++++++++++++++++++++ Procedure (CPT 11310)  After informed consent the exophytic pink 4 x 4 mm lesion of the Rt cheek was prepped with alcohol, anesthetized with 0.5 ml of Marcaine 0.5% and then sharply excised by shave technique and sent for path analysis. Then the wound base was lightly hyfrecated for hemostasis and an antibiotic ung and sterile dressing was applied. Patient was advised in wound care.

## 2017-06-18 LAB — HEPATIC FUNCTION PANEL
AG Ratio: 1.8 (calc) (ref 1.0–2.5)
ALT: 11 U/L (ref 6–29)
AST: 14 U/L (ref 10–35)
Albumin: 4 g/dL (ref 3.6–5.1)
Alkaline phosphatase (APISO): 70 U/L (ref 33–130)
Bilirubin, Direct: 0.1 mg/dL (ref 0.0–0.2)
Globulin: 2.2 g/dL (calc) (ref 1.9–3.7)
Indirect Bilirubin: 0.3 mg/dL (calc) (ref 0.2–1.2)
Total Bilirubin: 0.4 mg/dL (ref 0.2–1.2)
Total Protein: 6.2 g/dL (ref 6.1–8.1)

## 2017-06-18 LAB — LIPID PANEL
Cholesterol: 225 mg/dL — ABNORMAL HIGH (ref <200)
HDL: 80 mg/dL (ref >50)
LDL Cholesterol (Calc): 125 mg/dL (calc) — ABNORMAL HIGH
Non-HDL Cholesterol (Calc): 145 mg/dL (calc) — ABNORMAL HIGH (ref <130)
Total CHOL/HDL Ratio: 2.8 (calc) (ref <5.0)
Triglycerides: 94 mg/dL (ref <150)

## 2017-06-18 LAB — BASIC METABOLIC PANEL WITH GFR
BUN: 7 mg/dL (ref 7–25)
CO2: 26 mmol/L (ref 20–32)
Calcium: 9.1 mg/dL (ref 8.6–10.4)
Chloride: 106 mmol/L (ref 98–110)
Creat: 0.72 mg/dL (ref 0.50–0.99)
GFR, Est African American: 100 mL/min/{1.73_m2} (ref >=60)
GFR, Est Non African American: 86 mL/min/{1.73_m2} (ref >=60)
Glucose, Bld: 91 mg/dL (ref 65–99)
Potassium: 3.5 mmol/L (ref 3.5–5.3)
Sodium: 139 mmol/L (ref 135–146)

## 2017-06-18 LAB — CBC WITH DIFFERENTIAL/PLATELET
Basophils Absolute: 41 cells/uL (ref 0–200)
Basophils Relative: 0.7 %
Eosinophils Absolute: 130 cells/uL (ref 15–500)
Eosinophils Relative: 2.2 %
HCT: 41.5 % (ref 35.0–45.0)
Hemoglobin: 14 g/dL (ref 11.7–15.5)
Lymphs Abs: 2567 cells/uL (ref 850–3900)
MCH: 29.4 pg (ref 27.0–33.0)
MCHC: 33.7 g/dL (ref 32.0–36.0)
MCV: 87.2 fL (ref 80.0–100.0)
MPV: 11.3 fL (ref 7.5–12.5)
Monocytes Relative: 7.7 %
Neutro Abs: 2708 cells/uL (ref 1500–7800)
Neutrophils Relative %: 45.9 %
Platelets: 188 10*3/uL (ref 140–400)
RBC: 4.76 10*6/uL (ref 3.80–5.10)
RDW: 13 % (ref 11.0–15.0)
Total Lymphocyte: 43.5 %
WBC mixed population: 454 cells/uL (ref 200–950)
WBC: 5.9 10*3/uL (ref 3.8–10.8)

## 2017-06-18 LAB — HEMOGLOBIN A1C
Hgb A1c MFr Bld: 5.7 % of total Hgb — ABNORMAL HIGH (ref <5.7)
Mean Plasma Glucose: 117 (calc)
eAG (mmol/L): 6.5 (calc)

## 2017-06-18 LAB — VITAMIN D 25 HYDROXY (VIT D DEFICIENCY, FRACTURES): Vit D, 25-Hydroxy: 35 ng/mL (ref 30–100)

## 2017-06-18 LAB — MAGNESIUM: Magnesium: 1.9 mg/dL (ref 1.5–2.5)

## 2017-06-18 LAB — INSULIN, RANDOM: Insulin: 7.7 u[IU]/mL (ref 2.0–19.6)

## 2017-06-18 LAB — TSH: TSH: 0.51 mIU/L (ref 0.40–4.50)

## 2017-08-08 ENCOUNTER — Other Ambulatory Visit: Payer: Self-pay | Admitting: *Deleted

## 2017-08-08 MED ORDER — EZETIMIBE 10 MG PO TABS: 10.0000 mg | ORAL_TABLET | Freq: Every day | ORAL | 1 refills | Status: DC

## 2017-09-09 NOTE — Progress Notes (Signed)
Assessment and Plan:  Darletta was seen today for muscle pain.  Diagnoses and all orders for this visit:  Myalgia Isolated RUE/now LUE proximal myalgias with muscle fatigue - not typical of statin/zetia effects R/o autoimmune/inflammatory - if negative will try short course of steroids, consider refer to ortho -     CK -     Sedimentation rate -     C-reactive protein -     ANA -     Hepatic function panel -     CBC with Differential/Platelet  Further disposition pending results of labs. Discussed med's effects and SE's.   Over 15 minutes of exam, counseling, chart review, and critical decision making was performed.   Future Appointments  Date Time Provider [place]  09/22/2017  9:30 AM Liane Comber, NP [facility] None  12/22/2017 11:00 AM Unk Pinto, MD [facility] None    ------------------------------------------------------------------------------------------------------------------   HPI BP 124/70   Pulse 70   Temp 97.7 F (36.5 C)   Ht 5\' 2"  (1.575 m)   Wt 158 lb (71.7 kg)   SpO2 96%   BMI 28.90 kg/m   67 y.o.female presents for ongoing muscle aches previously throught to be related to statin use; she has been switched to zetia and reports new onset RUE extreme achiness, now in LUE as well. She reports pain as a strong ache - 7/10 - mainly in biceps/triceps with notable muslce fatigue and worse pain with attempts to lift arm above head or pick up a heavy item.  She denies fever/chills, N/V, changes in urine color or character. She does endorse some ongoing fatigue. She reports this began 7 days after initiating zetia on 12/7 - she is still taking the zetia, but taking 1/2 tab instead - hasn't made a difference. She has not tried any other medications/interventions for this pain. She denies joint pain of shoulder or elbow.   She reports with statin medications she had extreme foot soreness/tenderness when getting out of bed. She has hx of degenerative  spondylolisthesis with  lower back pain/lumbar disc surgery, followed by neuro. She does have baseline neck pain with some numbness/tingling that she is followed by neurology for - she denies worsening of these symptoms.  She is s/p R lumpectomy; no new symptoms, no RUE edema.   Past Medical History:  Diagnosis Date  . DCIS (ductal carcinoma in situ) of breast 04/29/2012  . HTN (hypertension)   . Hyperlipidemia   . Vitamin D deficiency      Allergies  Allergen Reactions  . Crestor [Rosuvastatin Calcium] Other (See Comments)    myalgias  . Red Yeast Rice [Cholestin] Other (See Comments)    Dizzy  . Tape Other (See Comments)    Pt states that skin burns, and it hurts severely.  Tori Milks [Naproxen Sodium] Rash  . Darvon [Propoxyphene] Nausea And Vomiting and Anxiety  . Latex Rash    Contact makes the skin feel "burned"    Current Outpatient Medications on File Prior to Visit  Medication Sig  . aspirin EC 81 MG tablet Take 81 mg by mouth daily.  . cholecalciferol (VITAMIN D) 1000 UNITS tablet Take 5,000 Units by mouth daily.   Marland Kitchen ezetimibe (ZETIA) 10 MG tablet Take 1 tablet (10 mg total) by mouth daily.  . Multiple Vitamin (MULTIVITAMIN) tablet Take 1 tablet by mouth daily.  . Multiple Vitamins-Minerals (ICAPS LUTEIN & OMEGA-3 PO) Take 1 capsule by mouth daily.   No current facility-administered medications on file prior to visit.  ROS: Review of Systems  Constitutional: Negative for chills, diaphoresis, fever, malaise/fatigue and weight loss.  HENT: Negative for hearing loss and tinnitus.   Eyes: Negative for blurred vision and double vision.  Respiratory: Negative for cough, shortness of breath and wheezing.   Cardiovascular: Negative for chest pain, palpitations, orthopnea, claudication and leg swelling.  Gastrointestinal: Negative for abdominal pain, blood in stool, constipation, diarrhea, heartburn, melena, nausea and vomiting.  Genitourinary: Negative.    Musculoskeletal: Positive for back pain, myalgias (R and now L UE) and neck pain. Negative for falls and joint pain.  Skin: Negative for rash.  Neurological: Positive for weakness (Muscle fatiuge RUE). Negative for dizziness, tingling, tremors, sensory change, focal weakness and headaches.  Endo/Heme/Allergies: Negative for polydipsia.  Psychiatric/Behavioral: Negative.   All other systems reviewed and are negative.    Physical Exam:  BP 124/70   Pulse 70   Temp 97.7 F (36.5 C)   Ht 5\' 2"  (1.575 m)   Wt 158 lb (71.7 kg)   SpO2 96%   BMI 28.90 kg/m   General Appearance: Well nourished, in no apparent distress. Eyes: PERRLA, EOMs, conjunctiva no swelling or erythema Sinuses: No Frontal/maxillary tenderness ENT/Mouth: Ext aud canals clear, TMs without erythema, bulging. No erythema, swelling, or exudate on post pharynx.  Tonsils not swollen or erythematous. Hearing normal.  Neck: Supple, thyroid normal.  Respiratory: Respiratory effort normal, BS equal bilaterally without rales, rhonchi, wheezing or stridor.  Cardio: RRR with no MRGs. Brisk peripheral pulses without edema.  Abdomen: Soft, + BS.  Non tender, no guarding, rebound, hernias, masses. Lymphatics: Non tender without lymphadenopathy.  Musculoskeletal: Full ROM, 5/5 strength, normal gait. No joint effusion, crepitus, bony abnormalities of R shoulder/elbow. No spinous tenderness. She has generalized tenderness to palpation of R biceps/tricepts - no point tenderness/warmth/injection. Sense of muscle fatigue/pain with active ROM, passive ROM normal.  Skin: Warm, dry without rashes, lesions, ecchymosis.  Neuro: Cranial nerves intact. Normal muscle tone, no cerebellar symptoms. Sensation intact.  Psych: Awake and oriented X 3, normal affect, Insight and Judgment appropriate.     Izora Ribas, NP 10:29 AM [facility]

## 2017-09-10 ENCOUNTER — Encounter: Payer: Self-pay | Admitting: Adult Health

## 2017-09-10 ENCOUNTER — Ambulatory Visit (INDEPENDENT_AMBULATORY_CARE_PROVIDER_SITE_OTHER): Payer: Medicare Other | Admitting: Adult Health

## 2017-09-10 VITALS — BP 124/70 | HR 70 | Temp 97.7°F | Ht 62.0 in | Wt 158.0 lb

## 2017-09-10 DIAGNOSIS — M791 Myalgia, unspecified site: Secondary | ICD-10-CM

## 2017-09-10 NOTE — Patient Instructions (Signed)
Depending on your labs results, we will call you to try some medication changes, follow up labs at your next visit, or referral to a specialist. Please take note of ongoing symptoms in the meantime and keep a record.    Muscle Pain, Adult Muscle pain (myalgia) may be mild or severe. In most cases, the pain lasts only a short time and it goes away without treatment. It is normal to feel some muscle pain after starting a workout program. Muscles that have not been used often will be sore at first. Muscle pain may also be caused by many other things, including:  Overuse or muscle strain, especially if you are not in shape. This is the most common cause of muscle pain.  Injury.  Bruises.  Viruses, such as the flu.  Infectious diseases.  A chronic condition that causes muscle tenderness, fatigue, and headache (fibromyalgia).  A condition, such as lupus, in which the body's disease-fighting system attacks other organs in the body (autoimmune or rheumatologic diseases).  Certain drugs, including ACE inhibitors and statins.  To diagnose the cause of your muscle pain, your health care provider will do a physical exam and ask questions about the pain and when it began. If you have not had muscle pain for very long, your health care provider may want to wait before doing much testing. If your muscle pain has lasted a long time, your health care provider may want to run tests right away. In some cases, this may include tests to rule out certain conditions or illnesses. Treatment for muscle pain depends on the cause. Home care is often enough to relieve muscle pain. Your health care provider may also prescribe anti-inflammatory medicine. Follow these instructions at home: Activity  If overuse is causing your muscle pain: ? Slow down your activities until the pain goes away. ? Do regular, gentle exercises if you are not usually active. ? Warm up before exercising. Stretch before and after  exercising. This can help lower the risk of muscle pain.  Do not continue working out if the pain is very bad. Bad pain could mean that you have injured a muscle. Managing pain and discomfort   If directed, apply ice to the sore muscle: ? Put ice in a plastic bag. ? Place a towel between your skin and the bag. ? Leave the ice on for 20 minutes, 2-3 times a day.  You may also alternate between applying ice and applying heat as told by your health care provider. To apply heat, use the heat source that your health care provider recommends, such as a moist heat pack or a heating pad. ? Place a towel between your skin and the heat source. ? Leave the heat on for 20-30 minutes. ? Remove the heat if your skin turns bright red. This is especially important if you are unable to feel pain, heat, or cold. You may have a greater risk of getting burned. Medicines  Take over-the-counter and prescription medicines only as told by your health care provider.  Do not drive or use heavy machinery while taking prescription pain medicine. Contact a health care provider if:  Your muscle pain gets worse and medicines do not help.  You have muscle pain that lasts longer than 3 days.  You have a rash or fever along with muscle pain.  You have muscle pain after a tick bite.  You have muscle pain while working out, even though you are in good physical condition.  You have redness,  soreness, or swelling along with muscle pain.  You have muscle pain after starting a new medicine or changing the dose of a medicine. Get help right away if:  You have trouble breathing.  You have trouble swallowing.  You have muscle pain along with a stiff neck, fever, and vomiting.  You have severe muscle weakness or cannot move part of your body. This information is not intended to replace advice given to you by your health care provider. Make sure you discuss any questions you have with your health care  provider. Document Released: 07/11/2006 Document Revised: 03/08/2016 Document Reviewed: 01/09/2016 Elsevier Interactive Patient Education  2018 [employer].

## 2017-09-11 LAB — HEPATIC FUNCTION PANEL
AG Ratio: 1.7 (calc) (ref 1.0–2.5)
ALT: 11 U/L (ref 6–29)
AST: 15 U/L (ref 10–35)
Albumin: 4 g/dL (ref 3.6–5.1)
Alkaline phosphatase (APISO): 71 U/L (ref 33–130)
Bilirubin, Direct: 0.1 mg/dL (ref 0.0–0.2)
Globulin: 2.4 g/dL (calc) (ref 1.9–3.7)
Indirect Bilirubin: 0.4 mg/dL (calc) (ref 0.2–1.2)
Total Bilirubin: 0.5 mg/dL (ref 0.2–1.2)
Total Protein: 6.4 g/dL (ref 6.1–8.1)

## 2017-09-11 LAB — CK: Total CK: 58 U/L (ref 29–143)

## 2017-09-11 LAB — ANA: Anti Nuclear Antibody(ANA): NEGATIVE

## 2017-09-11 LAB — C-REACTIVE PROTEIN: CRP: 0.7 mg/L (ref <8.0)

## 2017-09-11 LAB — SEDIMENTATION RATE: Sed Rate: 6 mm/h (ref 0–30)

## 2017-09-21 DIAGNOSIS — E663 Overweight: Secondary | ICD-10-CM | POA: Insufficient documentation

## 2017-09-21 NOTE — Progress Notes (Signed)
MEDICARE ANNUAL WELLNESS VISIT AND FOLLOW UP  Assessment:   Diagnoses and all orders for this visit:  Medicare annual wellness visit, subsequent Needs annually  Degenerative spondylolisthesis Continue follow up with Dr. Ellene Route  Osteopenia, unspecified location Most recent DEXA 2018; no significant change from comparison 2015 Continue vitamin D supplementation, recommend weight bearing exercises  History of R breast Ductal carcinoma in situ (DCIS) of breast R breast, 2010, s/p lumpectomy Continue annual follow up with [facility] as recommendd  Mixed hyperlipidemia ? Intolerance of medications vs resistance/poor compliance Patient continues to refuse medications citing myalgias despite low clinical suspicion of myalgias related to medication-  Discussed diet and exercise in great detail, restart omega 3, get on fiber supplement Continue to discuss risks of poor control and recommend pharmacological therapy Continue low cholesterol diet and exercise.  Check lipid panel.  -     Lipid panel -     TSH  Prediabetes Discussed disease and risks of elevated glucose Discussed diet/exercise, weight management  -     Hemoglobin A1c  Vitamin D deficiency Continue supplementation Defer vitamin D level  Medication management -     CBC with Differential/Platelet -     BASIC METABOLIC PANEL WITH GFR -     Hepatic function panel  Depression, controlled Continue medications  Lifestyle discussed: diet/exerise, sleep hygiene, stress management, hydration  Right-sided low back pain with right-sided sciatica, unspecified chronicity Continue follow up with ortho  Spinal stenosis of lumbar region without neurogenic claudication Continue follow up with ortho  Overweight (BMI 25.0-29.9) Long discussion about weight loss, diet, and exercise Recommended diet heavy in fruits and veggies and low in animal meats, cheeses, and dairy products, appropriate calorie intake Discussed  appropriate weight for height  Follow up at 3 months  Bilateral impacted cerumen/ Right ear pain  - stop using Qtips, irrigation used in the office without complications, use OTC drops/oil at home to prevent reoccurence Information for free hearing test provided to follow up   Advanced care planning/counseling discussion Patient without current advanced directives or living will - reviewed at length, paperwork provided for patient to complete and notarize  Over 40 minutes of exam, counseling, chart review and critical decision making was performed Future Appointments  Date Time Provider [place]  12/22/2017 11:00 AM Unk Pinto, MD [facility] None     Plan:   During the course of the visit the patient was educated and counseled about appropriate screening and preventive services including:    Pneumococcal vaccine   Prevnar 13  Influenza vaccine  Td vaccine  Screening electrocardiogram  Bone densitometry screening  Colorectal cancer screening  Diabetes screening  Glaucoma screening  Nutrition counseling   Advanced directives: requested   Subjective:  Regina Shaw is a 67 y.o. female who presents for Medicare Annual Wellness Visit and 3 month follow up.   BMI is Body mass index is 29.63 kg/m., she has been working on diet and exercise. Wt Readings from Last 3 Encounters:  09/22/17 162 lb (73.5 kg)  09/10/17 158 lb (71.7 kg)  06/17/17 161 lb 9.6 oz (73.3 kg)    Her blood pressure has been controlled at home, today their BP is BP: 126/72 She does not workout. She denies chest pain, shortness of breath, dizziness.   She is on cholesterol medication and endorses myalgias - zetia 10 mg causing BUE achiness and weakness that started immediately after restarting per recommendation after last visit.  Her cholesterol is not at goal. The  cholesterol last visit was:   Lab Results  Component Value Date   CHOL 225 (H) 06/17/2017   HDL 80 06/17/2017    LDLCALC 133 (H) 02/11/2017   LDLDIRECT 139.2 04/29/2012   TRIG 94 06/17/2017   CHOLHDL 2.8 06/17/2017    She has been working on diet and exercise for prediabetes, and denies increased appetite, nausea, paresthesia of the feet, polydipsia, polyuria, visual disturbances, vomiting and weight loss. Last A1C in the office was:  Lab Results  Component Value Date   HGBA1C 5.7 (H) 06/17/2017   Last GFR: Lab Results  Component Value Date   GFRNONAA 86 06/17/2017   Patient is on Vitamin D supplement but remains significantly below goal of 70 at recent check  Lab Results  Component Value Date   VD25OH 35 06/17/2017      Medication Review: Current Outpatient Medications on File Prior to Visit  Medication Sig Dispense Refill  . aspirin EC 81 MG tablet Take 81 mg by mouth daily.    . cholecalciferol (VITAMIN D) 1000 UNITS tablet Take 5,000 Units by mouth daily.     Marland Kitchen ezetimibe (ZETIA) 10 MG tablet Take 1 tablet (10 mg total) by mouth daily. 90 tablet 1  . Multiple Vitamin (MULTIVITAMIN) tablet Take 1 tablet by mouth daily.    . Multiple Vitamins-Minerals (ICAPS LUTEIN & OMEGA-3 PO) Take 1 capsule by mouth daily.     No current facility-administered medications on file prior to visit.     Allergies  Allergen Reactions  . Crestor [Rosuvastatin Calcium] Other (See Comments)    myalgias  . Red Yeast Rice [Cholestin] Other (See Comments)    Dizzy  . Tape Other (See Comments)    Pt states that skin burns, and it hurts severely.  Tori Milks [Naproxen Sodium] Rash  . Darvon [Propoxyphene] Nausea And Vomiting and Anxiety  . Latex Rash    Contact makes the skin feel "burned"    Current Problems (verified) Patient Active Problem List   Diagnosis Date Noted  . Overweight (BMI 25.0-29.9) 09/21/2017  . Osteopenia 11/16/2015  . Lumbar canal stenosis 02/09/2015  . Degenerative spondylolisthesis 02/09/2015  . Lumbago with Right Sciatica 11/13/2014  . Depression, controlled 10/18/2014  .  Medication management 10/17/2013  . Prediabetes 09/07/2013  . Vitamin D deficiency 09/07/2013  . Hyperlipidemia   . History of ductal carcinoma in situ (DCIS) of breast 04/29/2012    Screening Tests Immunization History  Administered Date(s) Administered  . Influenza, High Dose Seasonal PF 05/05/2014, 06/15/2015, 06/04/2016, 06/17/2017  . PPD Test 10/18/2013  . Pneumococcal Conjugate-13 05/05/2014  . Pneumococcal Polysaccharide-23 10/18/2014  . Tdap 10/18/2014  . Zoster 11/06/2009   Last colonoscopy: 2006 Cologuard: 2016 Last mammogram: 2018  need reports - [facility] - records requested Last pap smear/pelvic exam: 2018 - OBGYN - never abnormal pap DEXA:2010, 2015, 2018 by [facility] - osteopenia without significant change  Prior vaccinations: TD or Tdap: 2016  Influenza: 2018  Pneumococcal: 2016 Prevnar13: 2015 Shingles/Zostavax: 2011  Names of Other Physician/Practitioners you currently use: 1. [facility]- here for primary care 2. Dr. Katy Fitch, eye doctor, last visit 2018 - appointment on 09/30/2017 3. Dr. Mariea Clonts, dentist, last visit 1/19  Patient Care Team: Unk Pinto, MD as PCP - General (Internal Medicine) Ladene Artist, MD as Consulting Physician (Gastroenterology) Rozetta Nunnery, MD as Consulting Physician (Otolaryngology) Susa Day, MD as Consulting Physician (Orthopedic Surgery)  SURGICAL HISTORY She  has a past surgical history that includes  Tubal ligation; Breast lumpectomy (09/2009); Lumbar disc surgery; Tonsillectomy and adenoidectomy (1960); Cesarean section; and Laminectomy (Right, 07/18/2014). FAMILY HISTORY Her family history includes Alcohol abuse in her brother; COPD in her father; Heart attack (age of onset: 2) in her brother; Liver disease in her mother; Stroke (age of onset: 50) in her brother. SOCIAL HISTORY She  reports that she quit smoking about 12 years ago. She has a 10.00 pack-year  smoking history. she has never used smokeless tobacco. She reports that she does not drink alcohol or use drugs.   MEDICARE WELLNESS OBJECTIVES: Physical activity: Current Exercise Habits: The patient does not participate in regular exercise at present, Exercise limited by: None identified Cardiac risk factors: Cardiac Risk Factors include: advanced age (>41men, >38 women);dyslipidemia;hypertension;sedentary lifestyle;smoking/ tobacco exposure Depression/mood screen:   Depression screen [facility] 2/9 09/22/2017  Decreased Interest 0  Down, Depressed, Hopeless 0  PHQ - 2 Score 0    ADLs:  In your present state of health, do you have any difficulty performing the following activities: 09/22/2017 02/11/2017  Hearing? N N  Vision? N N  Difficulty concentrating or making decisions? N N  Walking or climbing stairs? N N  Dressing or bathing? N N  Doing errands, shopping? N N  Some recent data might be hidden     Cognitive Testing  Alert? Yes  Normal Appearance?Yes  Oriented to person? Yes  Place? Yes   Time? Yes  Recall of three objects?  Yes  Can perform simple calculations? Yes  Displays appropriate judgment?Yes  Can read the correct time from a watch face?Yes  EOL planning: Does Patient Have a Medical Advance Directive?: No Would patient like information on creating a medical advance directive?: Yes (MAU/Ambulatory/Procedural Areas - Information given)  Review of Systems  Constitutional: Negative for chills, fever, malaise/fatigue and weight loss.  HENT: Positive for ear pain (R) and hearing loss (Went to have evaluated and was told she needed irrigation due to bilateral impaction). Negative for tinnitus.   Eyes: Negative for blurred vision and double vision.  Respiratory: Negative for cough, shortness of breath and wheezing.   Cardiovascular: Negative for chest pain, palpitations, orthopnea, claudication and leg swelling.  Gastrointestinal: Negative for abdominal pain, blood in stool,  constipation, diarrhea, heartburn, melena, nausea and vomiting.  Genitourinary: Negative.   Musculoskeletal: Positive for myalgias (RUE). Negative for falls and joint pain.  Skin: Negative for rash.  Neurological: Negative for dizziness, tingling, sensory change, weakness and headaches.  Endo/Heme/Allergies: Negative for polydipsia.  Psychiatric/Behavioral: Negative.  Negative for depression, memory loss and substance abuse. The patient is not nervous/anxious and does not have insomnia.   All other systems reviewed and are negative.    Objective:     Today's Vitals   09/22/17 0940  BP: 126/72  Pulse: 67  Temp: 98.1 F (36.7 C)  SpO2: 97%  Weight: 162 lb (73.5 kg)  Height: 5\' 2"  (1.575 m)  PainSc: 4   PainLoc: Arm   Body mass index is 29.63 kg/m.  General appearance: alert, no distress, WD/WN, female HEENT: normocephalic, sclerae anicteric, TMs not visible secondary to dry cerumen impaction, nares patent, no discharge or erythema, pharynx normal Oral cavity: MMM, no lesions Neck: supple, no lymphadenopathy, no thyromegaly, no masses Heart: RRR, normal S1, S2, no murmurs Lungs: CTA bilaterally, no wheezes, rhonchi, or rales Abdomen: +bs, soft, non tender, non distended, no masses, no hepatomegaly, no splenomegaly Musculoskeletal: nontender, no swelling, no obvious deformity Extremities: no edema, no cyanosis, no clubbing Pulses: 2+ symmetric,  upper and lower extremities, normal cap refill Neurological: alert, oriented x 3, CN2-12 intact, strength normal upper extremities and lower extremities, sensation normal throughout, DTRs 2+ throughout, no cerebellar signs, gait normal Psychiatric: normal affect, behavior normal, pleasant   Medicare Attestation I have personally reviewed: The patient's medical and social history Their use of alcohol, tobacco or illicit drugs Their current medications and supplements The patient's functional ability including ADLs,fall risks, home  safety risks, cognitive, and hearing and visual impairment Diet and physical activities Evidence for depression or mood disorders  The patient's weight, height, BMI, and visual acuity have been recorded in the chart.  I have made referrals, counseling, and provided education to the patient based on review of the above and I have provided the patient with a written personalized care plan for preventive services.     Izora Ribas, NP   09/22/2017

## 2017-09-22 ENCOUNTER — Encounter: Payer: Self-pay | Admitting: Adult Health

## 2017-09-22 ENCOUNTER — Encounter: Payer: Self-pay | Admitting: Internal Medicine

## 2017-09-22 ENCOUNTER — Ambulatory Visit (INDEPENDENT_AMBULATORY_CARE_PROVIDER_SITE_OTHER): Payer: Medicare Other | Admitting: Adult Health

## 2017-09-22 VITALS — BP 126/72 | HR 67 | Temp 98.1°F | Ht 62.0 in | Wt 162.0 lb

## 2017-09-22 DIAGNOSIS — Z7189 Other specified counseling: Secondary | ICD-10-CM

## 2017-09-22 DIAGNOSIS — M48061 Spinal stenosis, lumbar region without neurogenic claudication: Secondary | ICD-10-CM

## 2017-09-22 DIAGNOSIS — M4310 Spondylolisthesis, site unspecified: Secondary | ICD-10-CM

## 2017-09-22 DIAGNOSIS — Z86000 Personal history of in-situ neoplasm of breast: Secondary | ICD-10-CM

## 2017-09-22 DIAGNOSIS — Z0001 Encounter for general adult medical examination with abnormal findings: Secondary | ICD-10-CM | POA: Diagnosis not present

## 2017-09-22 DIAGNOSIS — E559 Vitamin D deficiency, unspecified: Secondary | ICD-10-CM

## 2017-09-22 DIAGNOSIS — E782 Mixed hyperlipidemia: Secondary | ICD-10-CM

## 2017-09-22 DIAGNOSIS — R6889 Other general symptoms and signs: Secondary | ICD-10-CM | POA: Diagnosis not present

## 2017-09-22 DIAGNOSIS — R7303 Prediabetes: Secondary | ICD-10-CM

## 2017-09-22 DIAGNOSIS — M8580 Other specified disorders of bone density and structure, unspecified site: Secondary | ICD-10-CM

## 2017-09-22 DIAGNOSIS — M5441 Lumbago with sciatica, right side: Secondary | ICD-10-CM

## 2017-09-22 DIAGNOSIS — Z0000 Encounter for general adult medical examination without abnormal findings: Secondary | ICD-10-CM

## 2017-09-22 DIAGNOSIS — E663 Overweight: Secondary | ICD-10-CM

## 2017-09-22 DIAGNOSIS — F329 Major depressive disorder, single episode, unspecified: Secondary | ICD-10-CM

## 2017-09-22 DIAGNOSIS — H9201 Otalgia, right ear: Secondary | ICD-10-CM | POA: Diagnosis not present

## 2017-09-22 DIAGNOSIS — H6123 Impacted cerumen, bilateral: Secondary | ICD-10-CM

## 2017-09-22 DIAGNOSIS — Z79899 Other long term (current) drug therapy: Secondary | ICD-10-CM

## 2017-09-22 DIAGNOSIS — F32A Depression, unspecified: Secondary | ICD-10-CM

## 2017-09-22 NOTE — Progress Notes (Signed)
Ceruminosis is noted.  Wax is removed by syringing and manual debridement. Instructions for home care to prevent wax buildup are given.  

## 2017-09-22 NOTE — Patient Instructions (Signed)
Use a dropper or use a cap to put peroxide, olive oil,mineral oil or canola oil in the effected ear- 2-3 times a week. Let it soak for 20-30 min then you can take a shower or use a baby bulb with warm water to wash out the ear wax.  Do not use Qtips   [employer] with no obligation # [phone] Do not have to be a member Tues-Sat 10-6  [place]- free test with no obligation # [phone] MUST BE A MEMBER Call for store hours  Have had patient's get good cheaper hearing aids from [facility] The air version has good reviews.     Fat and Cholesterol Restricted Diet Getting too much fat and cholesterol in your diet may cause health problems. Following this diet helps keep your fat and cholesterol at normal levels. This can keep you from getting sick. What types of fat should I choose?  Choose monosaturated and polyunsaturated fats. These are found in foods such as olive oil, canola oil, flaxseeds, walnuts, almonds, and seeds.  Eat more omega-3 fats. Good choices include salmon, mackerel, sardines, tuna, flaxseed oil, and ground flaxseeds.  Limit saturated fats. These are in animal products such as meats, butter, and cream. They can also be in plant products such as palm oil, palm kernel oil, and coconut oil.  Avoid foods with partially hydrogenated oils in them. These contain trans fats. Examples of foods that have trans fats are stick margarine, some tub margarines, cookies, crackers, and other baked goods. What general guidelines do I need to follow?  Check food labels. Look for the words "trans fat" and "saturated fat."  When preparing a meal: ? Fill half of your plate with vegetables and green salads. ? Fill one fourth of your plate with whole grains. Look for the word "whole" as the first word in the ingredient list. ? Fill one fourth of your plate with lean protein foods.  Eat more foods that have fiber, like apples, carrots, beans, peas, and  barley.  Eat more home-cooked foods. Eat less at restaurants and buffets.  Limit or avoid alcohol.  Limit foods high in starch and sugar.  Limit fried foods.  Cook foods without frying them. Baking, boiling, grilling, and broiling are all great options.  Lose weight if you are overweight. Losing even a small amount of weight can help your overall health. It can also help prevent diseases such as diabetes and heart disease. What foods can I eat? Grains Whole grains, such as whole wheat or whole grain breads, crackers, cereals, and pasta. Unsweetened oatmeal, bulgur, barley, quinoa, or brown rice. Corn or whole wheat flour tortillas. Vegetables Fresh or frozen vegetables (raw, steamed, roasted, or grilled). Green salads. Fruits All fresh, canned (in natural juice), or frozen fruits. Meat and Other Protein Products Ground beef (85% or leaner), grass-fed beef, or beef trimmed of fat. Skinless chicken or [place]. Ground chicken or [place]. Pork trimmed of fat. All fish and seafood. Eggs. Dried beans, peas, or lentils. Unsalted nuts or seeds. Unsalted canned or dry beans. Dairy Low-fat dairy products, such as skim or 1% milk, 2% or reduced-fat cheeses, low-fat ricotta or cottage cheese, or plain low-fat yogurt. Fats and Oils Tub margarines without trans fats. Light or reduced-fat mayonnaise and salad dressings. Avocado. Olive, canola, sesame, or safflower oils. Natural peanut or almond butter (choose ones without added sugar and oil). The items listed above may not be a complete list of recommended foods or  beverages. Contact your dietitian for more options. What foods are not recommended? Grains White bread. White pasta. White rice. Cornbread. Bagels, pastries, and croissants. Crackers that contain trans fat. Vegetables White potatoes. Corn. Creamed or fried vegetables. Vegetables in a cheese sauce. Fruits Dried fruits. Canned fruit in light or heavy syrup. Fruit juice. Meat and Other  Protein Products Fatty cuts of meat. Ribs, chicken wings, bacon, sausage, bologna, salami, chitterlings, fatback, hot dogs, bratwurst, and packaged luncheon meats. Liver and organ meats. Dairy Whole or 2% milk, cream, half-and-half, and cream cheese. Whole milk cheeses. Whole-fat or sweetened yogurt. Full-fat cheeses. Nondairy creamers and whipped toppings. Processed cheese, cheese spreads, or cheese curds. Sweets and Desserts Corn syrup, sugars, honey, and molasses. Candy. Jam and jelly. Syrup. Sweetened cereals. Cookies, pies, cakes, donuts, muffins, and ice cream. Fats and Oils Butter, stick margarine, lard, shortening, ghee, or bacon fat. Coconut, palm kernel, or palm oils. Beverages Alcohol. Sweetened drinks (such as sodas, lemonade, and fruit drinks or punches). The items listed above may not be a complete list of foods and beverages to avoid. Contact your dietitian for more information. This information is not intended to replace advice given to you by your health care provider. Make sure you discuss any questions you have with your health care provider. Document Released: 02/18/2012 Document Revised: 04/25/2016 Document Reviewed: 11/18/2013 Elsevier Interactive Patient Education  [employer].

## 2017-09-23 LAB — HEMOGLOBIN A1C
Hgb A1c MFr Bld: 5.8 % of total Hgb — ABNORMAL HIGH (ref <5.7)
Mean Plasma Glucose: 120 (calc)
eAG (mmol/L): 6.6 (calc)

## 2017-09-23 LAB — CBC WITH DIFFERENTIAL/PLATELET
Basophils Absolute: 49 cells/uL (ref 0–200)
Basophils Relative: 0.8 %
Eosinophils Absolute: 153 cells/uL (ref 15–500)
Eosinophils Relative: 2.5 %
HCT: 42.6 % (ref 35.0–45.0)
Hemoglobin: 14.4 g/dL (ref 11.7–15.5)
Lymphs Abs: 2263 cells/uL (ref 850–3900)
MCH: 29.1 pg (ref 27.0–33.0)
MCHC: 33.8 g/dL (ref 32.0–36.0)
MCV: 86.2 fL (ref 80.0–100.0)
MPV: 11.7 fL (ref 7.5–12.5)
Monocytes Relative: 6.5 %
Neutro Abs: 3239 cells/uL (ref 1500–7800)
Neutrophils Relative %: 53.1 %
Platelets: 173 10*3/uL (ref 140–400)
RBC: 4.94 10*6/uL (ref 3.80–5.10)
RDW: 12.5 % (ref 11.0–15.0)
Total Lymphocyte: 37.1 %
WBC mixed population: 397 cells/uL (ref 200–950)
WBC: 6.1 10*3/uL (ref 3.8–10.8)

## 2017-09-23 LAB — BASIC METABOLIC PANEL WITH GFR
BUN: 11 mg/dL (ref 7–25)
CO2: 25 mmol/L (ref 20–32)
Calcium: 9.5 mg/dL (ref 8.6–10.4)
Chloride: 105 mmol/L (ref 98–110)
Creat: 0.71 mg/dL (ref 0.50–0.99)
GFR, Est African American: 101 mL/min/{1.73_m2} (ref >=60)
GFR, Est Non African American: 88 mL/min/{1.73_m2} (ref >=60)
Glucose, Bld: 93 mg/dL (ref 65–99)
Potassium: 4 mmol/L (ref 3.5–5.3)
Sodium: 140 mmol/L (ref 135–146)

## 2017-09-23 LAB — LIPID PANEL
Cholesterol: 247 mg/dL — ABNORMAL HIGH (ref <200)
HDL: 79 mg/dL (ref >50)
LDL Cholesterol (Calc): 134 mg/dL (calc) — ABNORMAL HIGH
Non-HDL Cholesterol (Calc): 168 mg/dL (calc) — ABNORMAL HIGH (ref <130)
Total CHOL/HDL Ratio: 3.1 (calc) (ref <5.0)
Triglycerides: 202 mg/dL — ABNORMAL HIGH (ref <150)

## 2017-09-23 LAB — HEPATIC FUNCTION PANEL
AG Ratio: 1.7 (calc) (ref 1.0–2.5)
ALT: 11 U/L (ref 6–29)
AST: 17 U/L (ref 10–35)
Albumin: 4.2 g/dL (ref 3.6–5.1)
Alkaline phosphatase (APISO): 75 U/L (ref 33–130)
Bilirubin, Direct: 0.1 mg/dL (ref 0.0–0.2)
Globulin: 2.5 g/dL (calc) (ref 1.9–3.7)
Indirect Bilirubin: 0.4 mg/dL (calc) (ref 0.2–1.2)
Total Bilirubin: 0.5 mg/dL (ref 0.2–1.2)
Total Protein: 6.7 g/dL (ref 6.1–8.1)

## 2017-09-23 LAB — TSH: TSH: 0.52 mIU/L (ref 0.40–4.50)

## 2017-09-25 ENCOUNTER — Other Ambulatory Visit: Payer: Self-pay | Admitting: Adult Health

## 2017-09-25 ENCOUNTER — Telehealth: Payer: Self-pay

## 2017-09-25 DIAGNOSIS — L209 Atopic dermatitis, unspecified: Secondary | ICD-10-CM

## 2017-09-25 MED ORDER — CRISABOROLE 2 % EX OINT: 1.0000 "application " | TOPICAL_OINTMENT | Freq: Two times a day (BID) | CUTANEOUS | 2 refills | Status: DC

## 2017-09-25 NOTE — Telephone Encounter (Signed)
Patient requesting an Rx for Eucrisa ointment. Pharmacy: [facility], [facility]

## 2017-09-30 LAB — HM DIABETES EYE EXAM

## 2017-10-01 ENCOUNTER — Other Ambulatory Visit: Payer: Self-pay | Admitting: Adult Health

## 2017-10-01 ENCOUNTER — Telehealth: Payer: Self-pay

## 2017-10-01 DIAGNOSIS — L309 Dermatitis, unspecified: Secondary | ICD-10-CM

## 2017-10-01 MED ORDER — TRIAMCINOLONE ACETONIDE 0.1 % EX OINT: 1.0000 "application " | TOPICAL_OINTMENT | Freq: Two times a day (BID) | CUTANEOUS | 1 refills | Status: DC

## 2017-10-01 NOTE — Telephone Encounter (Signed)
Will send in triamcinolone BID with refills. Please call to let her know this has been sent in. Thanks!

## 2017-10-01 NOTE — Telephone Encounter (Signed)
Spoke with patient about doing a PA for the [place] or trying something different that insurance will cover. Patient agreed to try something other than the [place]. Patient has her condition on her both hands and forehead.

## 2017-10-02 NOTE — Telephone Encounter (Signed)
Left detailed message on VM.

## 2017-10-13 ENCOUNTER — Encounter: Payer: Self-pay | Admitting: *Deleted

## 2017-10-21 ENCOUNTER — Encounter: Payer: Self-pay | Admitting: Internal Medicine

## 2017-10-21 ENCOUNTER — Other Ambulatory Visit: Payer: Self-pay | Admitting: Adult Health

## 2017-10-21 ENCOUNTER — Telehealth: Payer: Self-pay | Admitting: Internal Medicine

## 2017-10-21 NOTE — Telephone Encounter (Signed)
Completed as requested

## 2017-10-21 NOTE — Telephone Encounter (Signed)
patient called to request rx faxed to 2nd to Shar[facility]oussel for [facility].   Scheduled for bra fitting and products on  10-27-17. [phone],  fax [phone]  Rx: Lumpectomy Products  Dx:  Right breast lumpectomy Z86.000.  Hx of ductal ca of breast Z90.12 Please also write hard copy  rx and I will mail to patient.

## 2017-11-17 ENCOUNTER — Other Ambulatory Visit: Payer: Self-pay | Admitting: Internal Medicine

## 2017-12-05 ENCOUNTER — Other Ambulatory Visit: Payer: Self-pay | Admitting: Internal Medicine

## 2017-12-05 MED ORDER — PREDNISONE 10 MG PO TABS: ORAL_TABLET | ORAL | 0 refills | Status: DC

## 2017-12-11 ENCOUNTER — Encounter: Payer: Self-pay | Admitting: Internal Medicine

## 2017-12-21 NOTE — Progress Notes (Signed)
[facility] Unk Pinto, M.D.     Uvaldo Bristle. Silverio Lay, P.A.-C Liane Comber, [place] [address] [place] [address] Telephone [phone] Telefax [phone] Annual Screening/Preventative Visit & Comprehensive Evaluation &  Examination     This very nice 67 y.o. [facility]  presents for a Screening/Preventative Visit & comprehensive evaluation and management of multiple medical co-morbidities.  Patient has been followed for HTN, HLD, Prediabetes  and Vitamin D Deficiency. Patient has hx/o CIS of the Lt Breast tx'd by Lumpectomy/Radiation in 2011.      Patient has labile  HTN predating  since 2009 and monitored expectantly. Patient's BP has been controlled at home and patient denies any cardiac symptoms as chest pain, palpitations, shortness of breath, dizziness or ankle swelling. Today's BP is at goal - 126/82.      Patient's hyperlipidemia is not controlled with diet and she alleges severe intolerance to Statins &  Zetia. Last lipids were not at goal: Lab Results  Component Value Date   CHOL 247 (H) 09/22/2017   HDL 79 09/22/2017   LDLCALC 134 (H) 09/22/2017   LDLDIRECT 139.2 04/29/2012   TRIG 202 (H) 09/22/2017   CHOLHDL 3.1 09/22/2017      Patient has prediabetes (A1c 6.3%/2011, then 5.9%/2016 and 5.5%/02/2015) and patient denies reactive hypoglycemic symptoms, visual blurring, diabetic polys, or paresthesias. Last A1c was not at goal:  Lab Results  Component Value Date   HGBA1C 5.8 (H) 09/22/2017      Finally, patient has history of Vitamin D Deficiency ("24"/2008)  and last Vitamin D was still not at goal: Lab Results  Component Value Date   VD25OH 35 06/17/2017   Current Outpatient Medications on File Prior to Visit  Medication Sig  . cholecalciferol (VITAMIN D) 1000 UNITS tablet Take 5,000 Units by mouth daily.   . Multiple Vitamin (MULTIVITAMIN) tablet Take 1 tablet by mouth daily.  .  Multiple Vitamins-Minerals (ICAPS LUTEIN & OMEGA-3 PO) Take 1 capsule by mouth daily.   No current facility-administered medications on file prior to visit.    Allergies  Allergen Reactions  . Crestor [Rosuvastatin Calcium] Other (See Comments)    myalgias  . Red Yeast Rice [Cholestin] Other (See Comments)    Dizzy  . Tape Other (See Comments)    Pt states that skin burns, and it hurts severely.  Tori Milks [Naproxen Sodium] Rash  . Darvon [Propoxyphene] Nausea And Vomiting and Anxiety  . Latex Rash    Contact makes the skin feel "burned"   Past Medical History:  Diagnosis Date  . DCIS (ductal carcinoma in situ) of breast 04/29/2012  . HTN (hypertension)   . Hyperlipidemia   . Vitamin D deficiency    Health Maintenance  Topic Date Due  . Fecal DNA (Cologuard)  11/07/2017  . INFLUENZA VACCINE  04/02/2018  . MAMMOGRAM  01/17/2019  . TETANUS/TDAP  10/18/2024  . DEXA SCAN  Completed  . Hepatitis C Screening  Completed  . PNA vac Low Risk Adult  Completed   Immunization History  Administered Date(s) Administered  . Influenza, High Dose Seasonal PF 05/05/2014, 06/15/2015, 06/04/2016, 06/17/2017  . PPD Test 10/18/2013  . Pneumococcal Conjugate-13 05/05/2014  . Pneumococcal Polysaccharide-23 10/18/2014  . Tdap 10/18/2014  . Zoster 11/06/2009   - Cologard 11/16/2014 Negative and 3 yr f/u overdue 11/15/2017 and re-requested  Last MGM - 5/17/2018and f/u appt scheduled 02/05/2018  Past Surgical History:  Procedure Laterality Date  . BREAST LUMPECTOMY  09/2009   [place]    . LAMINECTOMY Right 07/18/2014   Procedure: Right Lumbar Four to Five Laminectomy for Synovial cyst;  Surgeon: Kristeen Miss, MD;  Location: [facility];  Service: Neurosurgery;  Laterality: Right;  Right L4-5 Laminectomy for Synovial cyst  . LUMBAR [facility]  . TONSILLECTOMY AND ADENOIDECTOMY  1960  . TUBAL LIGATION     Family History  Problem Relation Age of  Onset  . Liver disease Mother        d/c at 35, acute liver failure  . COPD Father        d/c at 61  . Heart attack Brother 4  . Stroke Brother 53       same brother  . Alcohol abuse Brother        other brother   Social History   Tobacco Use  . Smoking status: Former Smoker    Packs/day: 1.00    Years: 10.00    Pack years: 10.00    Last attempt to quit: 09/02/2005    Years since quitting: 12.3  . Smokeless tobacco: Never Used  Substance Use Topics  . Alcohol use: No  . Drug use: No    ROS Constitutional: Denies fever, chills, weight loss/gain, headaches, insomnia,  night sweats, and change in appetite. Does c/o fatigue. Eyes: Denies redness, blurred vision, diplopia, discharge, itchy, watery eyes.  ENT: Denies discharge, congestion, post nasal drip, epistaxis, sore throat, earache, hearing loss, dental pain, Tinnitus, Vertigo, Sinus pain, snoring.  Cardio: Denies chest pain, palpitations, irregular heartbeat, syncope, dyspnea, diaphoresis, orthopnea, PND, claudication, edema Respiratory: denies cough, dyspnea, DOE, pleurisy, hoarseness, laryngitis, wheezing.  Gastrointestinal: Denies dysphagia, heartburn, reflux, water brash, pain, cramps, nausea, vomiting, bloating, diarrhea, constipation, hematemesis, melena, hematochezia, jaundice, hemorrhoids Genitourinary: Denies dysuria, frequency, urgency, nocturia, hesitancy, discharge, hematuria, flank pain Breast: Breast lumps, nipple discharge, bleeding.  Musculoskeletal: Denies arthralgia, myalgia, stiffness, Jt. Swelling, pain, limp, and strain/sprain. Denies falls. Skin: Denies puritis, rash, hives, warts, acne, eczema, changing in skin lesion Neuro: No weakness, tremor, incoordination, spasms, paresthesia, pain Psychiatric: Denies confusion, memory loss, sensory loss. Denies Depression. Endocrine: Denies change in weight, skin, hair change, nocturia, and paresthesia, diabetic polys, visual blurring, hyper / hypo glycemic episodes.   Heme/Lymph: No excessive bleeding, bruising, enlarged lymph nodes.  Physical Exam  BP 126/82   Pulse 72   Temp (!) 97 F (36.1 C)   Resp 18   Ht 5\' 2"  (1.575 m)   Wt 156 lb 9.6 oz (71 kg)   BMI 28.64 kg/m   General Appearance: Well nourished, well groomed and in no apparent distress.  Eyes: PERRLA, EOMs, conjunctiva no swelling or erythema, normal fundi and vessels. Sinuses: No frontal/maxillary tenderness ENT/Mouth: EACs patent / TMs  nl. Nares clear without erythema, swelling, mucoid exudates. Oral hygiene is good. No erythema, swelling, or exudate. Tongue normal, non-obstructing. Tonsils not swollen or erythematous. Hearing normal.  Neck: Supple, thyroid not palpable. No bruits, nodes or JVD. Respiratory: Respiratory effort normal.  BS equal and clear bilateral without rales, rhonci, wheezing or stridor. Cardio: Heart sounds are normal with regular rate and rhythm and no murmurs, rubs or gallops. Peripheral pulses are normal and equal bilaterally without edema. No aortic or femoral bruits. Chest: symmetric with normal excursions and percussion. Breasts: Symmetric, without lumps, nipple discharge, retractions, or fibrocystic changes.  Abdomen: Flat, soft with bowel sounds active. Nontender, no guarding, rebound, hernias, masses, or organomegaly.  Lymphatics: Non tender without lymphadenopathy.  Genitourinary:  Musculoskeletal: Full ROM all peripheral extremities, joint stability, 5/5 strength, and normal gait. Skin: Warm and dry without rashes, lesions, cyanosis, clubbing or  ecchymosis.  Neuro: Cranial nerves intact, reflexes equal bilaterally. Normal muscle tone, no cerebellar symptoms. Sensation intact.  Pysch: Alert and oriented X 3, normal affect, Insight and Judgment appropriate.   Assessment and Plan  1. Annual Preventative Screening Examination  2. Labile hypertension  - EKG 12-Lead - Urinalysis, Routine w reflex microscopic - Microalbumin / creatinine urine  ratio - CBC with Differential/Platelet - COMPLETE METABOLIC PANEL WITH GFR - Magnesium - TSH  3. Hyperlipidemia, mixed  - EKG 12-Lead - COMPLETE METABOLIC PANEL WITH GFR - Lipid panel - TSH  4. Abnormal glucose  - EKG 12-Lead - Hemoglobin A1c - Insulin, random  5. Vitamin D deficiency  - VITAMIN D 25 Hydroxyl  6. Prediabetes  - EKG 12-Lead - Hemoglobin A1c - Insulin, random  7. History of ductal carcinoma in situ (DCIS) of breast   8. Screening for colorectal cancer  - POC Hemoccult Bld/Stl  9. Screening for ischemic heart disease  - EKG 12-Lead  10. FHx: heart disease  - EKG 12-Lead  11. Former smoker  - EKG 12-Lead  12. Aortic atherosclerosis (HCC)  - EKG 12-Lead  13. Medication management  - Urinalysis, Routine w reflex microscopic - Microalbumin / creatinine urine ratio - CBC with Differential/Platelet - COMPLETE METABOLIC PANEL WITH GFR - Magnesium - Lipid panel - TSH - Hemoglobin A1c - Insulin, random - VITAMIN D 25 Hydroxyl           Patient was counseled in prudent diet to achieve/maintain BMI less than 25 for weight control, BP monitoring, regular exercise and medications. Discussed med's effects and SE's. Screening labs and tests as requested with regular follow-up as recommended. Over 40 minutes of exam, counseling, chart review and high complex critical decision making was performed.

## 2017-12-21 NOTE — Patient Instructions (Signed)

## 2017-12-22 ENCOUNTER — Encounter: Payer: Self-pay | Admitting: Internal Medicine

## 2017-12-22 ENCOUNTER — Ambulatory Visit (INDEPENDENT_AMBULATORY_CARE_PROVIDER_SITE_OTHER): Payer: Medicare Other | Admitting: Internal Medicine

## 2017-12-22 VITALS — BP 126/82 | HR 72 | Temp 97.0°F | Resp 18 | Ht 62.0 in | Wt 156.6 lb

## 2017-12-22 DIAGNOSIS — Z136 Encounter for screening for cardiovascular disorders: Secondary | ICD-10-CM | POA: Diagnosis not present

## 2017-12-22 DIAGNOSIS — Z87891 Personal history of nicotine dependence: Secondary | ICD-10-CM

## 2017-12-22 DIAGNOSIS — Z1212 Encounter for screening for malignant neoplasm of rectum: Secondary | ICD-10-CM

## 2017-12-22 DIAGNOSIS — Z0000 Encounter for general adult medical examination without abnormal findings: Secondary | ICD-10-CM | POA: Diagnosis not present

## 2017-12-22 DIAGNOSIS — Z8249 Family history of ischemic heart disease and other diseases of the circulatory system: Secondary | ICD-10-CM

## 2017-12-22 DIAGNOSIS — I10 Essential (primary) hypertension: Secondary | ICD-10-CM

## 2017-12-22 DIAGNOSIS — Z79899 Other long term (current) drug therapy: Secondary | ICD-10-CM

## 2017-12-22 DIAGNOSIS — Z0001 Encounter for general adult medical examination with abnormal findings: Secondary | ICD-10-CM

## 2017-12-22 DIAGNOSIS — R7309 Other abnormal glucose: Secondary | ICD-10-CM

## 2017-12-22 DIAGNOSIS — E559 Vitamin D deficiency, unspecified: Secondary | ICD-10-CM

## 2017-12-22 DIAGNOSIS — I700 Atherosclerosis of aorta: Secondary | ICD-10-CM

## 2017-12-22 DIAGNOSIS — Z1211 Encounter for screening for malignant neoplasm of colon: Secondary | ICD-10-CM

## 2017-12-22 DIAGNOSIS — R0989 Other specified symptoms and signs involving the circulatory and respiratory systems: Secondary | ICD-10-CM

## 2017-12-22 DIAGNOSIS — E782 Mixed hyperlipidemia: Secondary | ICD-10-CM

## 2017-12-22 DIAGNOSIS — R7303 Prediabetes: Secondary | ICD-10-CM

## 2017-12-22 DIAGNOSIS — Z86000 Personal history of in-situ neoplasm of breast: Secondary | ICD-10-CM

## 2017-12-23 ENCOUNTER — Other Ambulatory Visit: Payer: Self-pay | Admitting: Internal Medicine

## 2017-12-23 DIAGNOSIS — E782 Mixed hyperlipidemia: Secondary | ICD-10-CM

## 2017-12-23 LAB — CBC WITH DIFFERENTIAL/PLATELET
Basophils Absolute: 30 cells/uL (ref 0–200)
Basophils Relative: 0.4 %
Eosinophils Absolute: 59 cells/uL (ref 15–500)
Eosinophils Relative: 0.8 %
HCT: 41.6 % (ref 35.0–45.0)
Hemoglobin: 14.3 g/dL (ref 11.7–15.5)
Lymphs Abs: 3093 cells/uL (ref 850–3900)
MCH: 29.4 pg (ref 27.0–33.0)
MCHC: 34.4 g/dL (ref 32.0–36.0)
MCV: 85.4 fL (ref 80.0–100.0)
MPV: 10.8 fL (ref 7.5–12.5)
Monocytes Relative: 6.8 %
Neutro Abs: 3715 cells/uL (ref 1500–7800)
Neutrophils Relative %: 50.2 %
Platelets: 168 10*3/uL (ref 140–400)
RBC: 4.87 10*6/uL (ref 3.80–5.10)
RDW: 13.3 % (ref 11.0–15.0)
Total Lymphocyte: 41.8 %
WBC mixed population: 503 cells/uL (ref 200–950)
WBC: 7.4 10*3/uL (ref 3.8–10.8)

## 2017-12-23 LAB — URINALYSIS, ROUTINE W REFLEX MICROSCOPIC
Bilirubin Urine: NEGATIVE
Glucose, UA: NEGATIVE
Hgb urine dipstick: NEGATIVE
Ketones, ur: NEGATIVE
Leukocytes, UA: NEGATIVE
Nitrite: NEGATIVE
Protein, ur: NEGATIVE
Specific Gravity, Urine: 1.012 (ref 1.001–1.03)
pH: 6 (ref 5.0–8.0)

## 2017-12-23 LAB — COMPLETE METABOLIC PANEL WITH GFR
AG Ratio: 1.8 (calc) (ref 1.0–2.5)
ALT: 15 U/L (ref 6–29)
AST: 14 U/L (ref 10–35)
Albumin: 4.2 g/dL (ref 3.6–5.1)
Alkaline phosphatase (APISO): 67 U/L (ref 33–130)
BUN: 14 mg/dL (ref 7–25)
CO2: 28 mmol/L (ref 20–32)
Calcium: 9.5 mg/dL (ref 8.6–10.4)
Chloride: 102 mmol/L (ref 98–110)
Creat: 0.75 mg/dL (ref 0.50–0.99)
GFR, Est African American: 95 mL/min/{1.73_m2} (ref >=60)
GFR, Est Non African American: 82 mL/min/{1.73_m2} (ref >=60)
Globulin: 2.3 g/dL (calc) (ref 1.9–3.7)
Glucose, Bld: 83 mg/dL (ref 65–99)
Potassium: 3.6 mmol/L (ref 3.5–5.3)
Sodium: 139 mmol/L (ref 135–146)
Total Bilirubin: 0.6 mg/dL (ref 0.2–1.2)
Total Protein: 6.5 g/dL (ref 6.1–8.1)

## 2017-12-23 LAB — LIPID PANEL
Cholesterol: 260 mg/dL — ABNORMAL HIGH (ref <200)
HDL: 81 mg/dL (ref >50)
LDL Cholesterol (Calc): 151 mg/dL (calc) — ABNORMAL HIGH
Non-HDL Cholesterol (Calc): 179 mg/dL (calc) — ABNORMAL HIGH (ref <130)
Total CHOL/HDL Ratio: 3.2 (calc) (ref <5.0)
Triglycerides: 146 mg/dL (ref <150)

## 2017-12-23 LAB — HEMOGLOBIN A1C
Hgb A1c MFr Bld: 5.9 % of total Hgb — ABNORMAL HIGH (ref <5.7)
Mean Plasma Glucose: 123 (calc)
eAG (mmol/L): 6.8 (calc)

## 2017-12-23 LAB — MAGNESIUM: Magnesium: 2 mg/dL (ref 1.5–2.5)

## 2017-12-23 LAB — MICROALBUMIN / CREATININE URINE RATIO
Creatinine, Urine: 60 mg/dL (ref 20–275)
Microalb Creat Ratio: 3 mcg/mg creat (ref <30)
Microalb, Ur: 0.2 mg/dL

## 2017-12-23 LAB — VITAMIN D 25 HYDROXY (VIT D DEFICIENCY, FRACTURES): Vit D, 25-Hydroxy: 84 ng/mL (ref 30–100)

## 2017-12-23 LAB — TSH: TSH: 0.47 mIU/L (ref 0.40–4.50)

## 2017-12-29 ENCOUNTER — Ambulatory Visit (INDEPENDENT_AMBULATORY_CARE_PROVIDER_SITE_OTHER): Payer: Medicare Other | Admitting: Pharmacist

## 2017-12-29 DIAGNOSIS — E782 Mixed hyperlipidemia: Secondary | ICD-10-CM | POA: Diagnosis not present

## 2017-12-29 NOTE — Progress Notes (Signed)
Patient ID: Regina Shaw                 DOB: 1951/06/28                    MRN: [id number]     HPI: Regina Shaw is a 67 y.o. female patient of Dr Acie Fredrickson that presents today for lipid evaluation.  PMH includes HTN, HDL , prediabetes, breast cancer. She does have a latex allergy. Her 10 year ASCVD risk is 6.9%.   She presents today for discussion of cholesterol. She states that she has been unable to tolerate statin medications in the past due to joint and muscle pains. She becomes tearful when talking about medications as she states she has had bad experiences with medications. She believes that medication contributed to her husbands cancer and her mothers liver dysfunction. She states that she knows we know more about medications now, but they scare her and she would like to control her cholesterol through diet and exercise if at all possible. She only took the Zetia 10mg  for about a week before she developed joint pain and arm pain similar to that with the statin medications.   Risk Factors: HTN, pre diabetes LDL Goal: <100  Current Medications:  Intolerances: crestor 20mg  daily, red yeast rice (dizzy), atorvastatin 40mg  daily, Zetia 10mg  daily (joint pain in shoulder an arm after 2 weeks of therapy)  Diet: She eats out and from home. She avoids fast food. She does not eat red meat. She eats chicken. She eats soups like chili with beans and peppers. She tries to use [place]. She does admit that she could be better about vegetables. She drinks sweet tea and water mostly. Occasional coke.   Exercise: She walks 1 hour on treadmill - once or twice a week - and is a member of several gyms.   Family History: Alcohol abuse in her brother; COPD in her father; Heart attack (age of onset: 48) in her brother; Liver disease in her mother; Stroke (age of onset: 90) in her brother.  Social History: The patient  reports that she quit smoking about 15 years ago. She has never used smokeless tobacco.  She reports that she does not drink alcohol or use drugs.   Labs: 12/22/17: TC 260, TG 146, HDL 81, LDL 151, nonHDL 179 (no therapy)  Past Medical History:  Diagnosis Date  . DCIS (ductal carcinoma in situ) of breast 04/29/2012  . HTN (hypertension)   . Hyperlipidemia   . Vitamin D deficiency     Current Outpatient Medications on File Prior to Visit  Medication Sig Dispense Refill  . cholecalciferol (VITAMIN D) 1000 UNITS tablet Take 5,000 Units by mouth daily.     . Multiple Vitamin (MULTIVITAMIN) tablet Take 1 tablet by mouth daily.    . Multiple Vitamins-Minerals (ICAPS LUTEIN & OMEGA-3 PO) Take 1 capsule by mouth daily.     No current facility-administered medications on file prior to visit.     Allergies  Allergen Reactions  . Crestor [Rosuvastatin Calcium] Other (See Comments)    myalgias  . Red Yeast Rice [Cholestin] Other (See Comments)    Dizzy  . Tape Other (See Comments)    Pt states that skin burns, and it hurts severely.  Tori Milks [Naproxen Sodium] Rash  . Darvon [Propoxyphene] Nausea And Vomiting and Anxiety  . Latex Rash    Contact makes the skin feel "burned"    Assessment/Plan: Hyperlipidemia: LDL not at goal.  Had lengthy conversation about medication options. She would like to modify her diet and see how close she can get to goal with lifestyle modifications before considering another medication. Discussed lifestyle modifications at length and provided her with some dietary handouts on clean cholesterol eating. Also recommended Benecol butter spread and or Benecol chews since she uses butter currently. Benecol blocks absorption of cholesterol and will likely help to lower LDL (though we have not demonstrated CV risk reduction with it). Pt will have repeat lipid panel with PCP in July/Aug - will follow for results and need to add additional management.    Thank you,  Lelan Pons. Patterson Hammersmith, [facility]  12/29/2017 7:04 AM

## 2017-12-29 NOTE — Patient Instructions (Addendum)
Continue to work on diet and exercise. Try using Benecol spread instead of butter and you can try the chews 2 chews with first bite of meals twice daily.   We will follow for your repeat cholesterol panel and we can pursue medications at that time if appropriate.   Cholesterol Cholesterol is a fat. Your body needs a small amount of cholesterol. Cholesterol (plaque) may build up in your blood vessels (arteries). That makes you more likely to have a heart attack or stroke. You cannot feel your cholesterol level. Having a blood test is the only way to find out if your level is high. Keep your test results. Work with your doctor to keep your cholesterol at a good level. What do the results mean?  Total cholesterol is how much cholesterol is in your blood.  LDL is bad cholesterol. This is the type that can build up. Try to have low LDL.  HDL is good cholesterol. It cleans your blood vessels and carries LDL away. Try to have high HDL.  Triglycerides are fat that the body can store or burn for energy. What are good levels of cholesterol?  Total cholesterol below 200.  LDL below 100 is good for people who have health risks. LDL below 70 is good for people who have very high risks.  HDL above 40 is good. It is best to have HDL of 60 or higher.  Triglycerides below 150. How can I lower my cholesterol? Diet Follow your diet program as told by your doctor.  Choose fish, white meat chicken, or [place] that is roasted or baked. Try not to eat red meat, fried foods, sausage, or lunch meats.  Eat lots of fresh fruits and vegetables.  Choose whole grains, beans, pasta, potatoes, and cereals.  Choose olive oil, corn oil, or canola oil. Only use small amounts.  Try not to eat butter, mayonnaise, shortening, or palm kernel oils.  Try not to eat foods with trans fats.  Choose low-fat or nonfat dairy foods. ? Drink skim or nonfat milk. ? Eat low-fat or nonfat yogurt and cheeses. ? Try not to  drink whole milk or cream. ? Try not to eat ice cream, egg yolks, or full-fat cheeses.  Healthy desserts include angel food cake, ginger snaps, animal crackers, hard candy, popsicles, and low-fat or nonfat frozen yogurt. Try not to eat pastries, cakes, pies, and cookies.  Exercise Follow your exercise program as told by your doctor.  Be more active. Try gardening, walking, and taking the stairs.  Ask your doctor about ways that you can be more active.  Medicine  Take over-the-counter and prescription medicines only as told by your doctor. This information is not intended to replace advice given to you by your health care provider. Make sure you discuss any questions you have with your health care provider. Document Released: 11/15/2008 Document Revised: 03/20/2016 Document Reviewed: 02/29/2016 Elsevier Interactive Patient Education  [employer].

## 2017-12-31 ENCOUNTER — Encounter: Payer: Self-pay | Admitting: Pharmacist

## 2018-01-15 LAB — COLOGUARD: Cologuard: NEGATIVE

## 2018-01-19 ENCOUNTER — Encounter: Payer: Self-pay | Admitting: *Deleted

## 2018-02-17 ENCOUNTER — Other Ambulatory Visit: Payer: Self-pay | Admitting: Internal Medicine

## 2018-02-17 MED ORDER — ALPRAZOLAM 0.5 MG PO TABS: ORAL_TABLET | ORAL | 0 refills | Status: DC

## 2018-02-17 MED ORDER — SERTRALINE HCL 50 MG PO TABS: ORAL_TABLET | ORAL | 1 refills | Status: DC

## 2018-04-21 NOTE — Progress Notes (Deleted)
FOLLOW UP  Assessment and Plan:   Cholesterol Currently above goal; patient alleges severe statin/zetia intolerance; resins discussed *** Continue low cholesterol diet and exercise.  Check lipid panel.   Prediabetes Continue diet and exercise.  Perform daily foot/skin check, notify office of any concerning changes.  Check A1C  Overweight Long discussion about weight loss, diet, and exercise Recommended diet heavy in fruits and veggies and low in animal meats, cheeses, and dairy products, appropriate calorie intake Discussed ideal weight for height and initial weight goal (***) Patient will work on *** Will follow up in 3 months  Vitamin D Def At goal at last visit; continue supplementation to maintain goal of 70-100 Defer Vit D level  Depression/anxiety Continue medications  Lifestyle discussed: diet/exerise, sleep hygiene, stress management, hydration   Continue diet and meds as discussed. Further disposition pending results of labs. Discussed med's effects and SE's.   Over 30 minutes of exam, counseling, chart review, and critical decision making was performed.   Future Appointments  Date Time Provider [place]  04/23/2018  8:45 AM Liane Comber, NP [facility] None  08/03/2018  9:30 AM Unk Pinto, MD [facility] None  01/19/2019 10:00 AM Unk Pinto, MD [facility] None    ----------------------------------------------------------------------------------------------------------------------  HPI 67 y.o. female  presents for 3 month follow up on hypertension, cholesterol, prediabetes, weight and vitamin D deficiency.   She has depression treated by zoloft 50 mg daily and PRN xanax 0.25-0.5 mg TID PRN;   BMI is There is no height or weight on file to calculate BMI., she {HAS HAS ZDG:64403} been working on diet and exercise. Wt Readings from Last 3 Encounters:  12/22/17 156 lb 9.6 oz (71 kg)  09/22/17 162 lb (73.5 kg)  09/10/17 158 lb (71.7 kg)     Her blood pressure {HAS HAS NOT:18834} been controlled at home, today their BP is    She {DOES_DOES KVQ:25956} workout. She denies chest pain, shortness of breath, dizziness.   She is not on cholesterol medication; she alleges severe myalgias on statins and zetia. Her cholesterol is not at goal. The cholesterol last visit was:   Lab Results  Component Value Date   CHOL 260 (H) 12/22/2017   HDL 81 12/22/2017   LDLCALC 151 (H) 12/22/2017   LDLDIRECT 139.2 04/29/2012   TRIG 146 12/22/2017   CHOLHDL 3.2 12/22/2017    She {Has/has not:18111} been working on diet and exercise for prediabetes, and denies {Symptoms; diabetes w/o none:19199}. Last A1C in the office was:  Lab Results  Component Value Date   HGBA1C 5.9 (H) 12/22/2017   Patient is on Vitamin D supplement.   Lab Results  Component Value Date   VD25OH 84 12/22/2017        Current Medications:  Current Outpatient Medications on File Prior to Visit  Medication Sig  . ALPRAZolam (XANAX) 0.5 MG tablet Take 1/2 to 1 tablet 2 to 3 x/day as needed for Stress or Anxiety  . cholecalciferol (VITAMIN D) 1000 UNITS tablet Take 5,000 Units by mouth daily.   Marland Kitchen ibuprofen (ADVIL,MOTRIN) 200 MG tablet Take 400 mg by mouth daily as needed.  . Multiple Vitamin (MULTIVITAMIN) tablet Take 1 tablet by mouth daily.  . Multiple Vitamins-Minerals (ICAPS LUTEIN & OMEGA-3 PO) Take 1 capsule by mouth daily.  . sertraline (ZOLOFT) 50 MG tablet Take 1 tablet daily for Mood   No current facility-administered medications on file prior to visit.      Allergies:  Allergies  Allergen Reactions  . Crestor [  Rosuvastatin Calcium] Other (See Comments)    myalgias  . Red Yeast Rice [Cholestin] Other (See Comments)    Dizzy  . Tape Other (See Comments)    Pt states that skin burns, and it hurts severely.  Tori Milks [Naproxen Sodium] Rash  . Darvon [Propoxyphene] Nausea And Vomiting and Anxiety  . Latex Rash    Contact makes the skin feel "burned"      Medical History:  Past Medical History:  Diagnosis Date  . DCIS (ductal carcinoma in situ) of breast 04/29/2012  . HTN (hypertension)   . Hyperlipidemia   . Vitamin D deficiency    Family history- Reviewed and unchanged Social history- Reviewed and unchanged   Review of Systems:  Review of Systems  Constitutional: Negative for malaise/fatigue and weight loss.  HENT: Negative for hearing loss and tinnitus.   Eyes: Negative for blurred vision and double vision.  Respiratory: Negative for cough, shortness of breath and wheezing.   Cardiovascular: Negative for chest pain, palpitations, orthopnea, claudication and leg swelling.  Gastrointestinal: Negative for abdominal pain, blood in stool, constipation, diarrhea, heartburn, melena, nausea and vomiting.  Genitourinary: Negative.   Musculoskeletal: Positive for back pain. Negative for joint pain and myalgias.  Skin: Negative for rash.  Neurological: Negative for dizziness, tingling, sensory change, weakness and headaches.  Endo/Heme/Allergies: Negative for polydipsia.  Psychiatric/Behavioral: Negative.   All other systems reviewed and are negative.     Physical Exam: There were no vitals taken for this visit. Wt Readings from Last 3 Encounters:  12/22/17 156 lb 9.6 oz (71 kg)  09/22/17 162 lb (73.5 kg)  09/10/17 158 lb (71.7 kg)   General Appearance: Well nourished, in no apparent distress. Eyes: PERRLA, EOMs, conjunctiva no swelling or erythema Sinuses: No Frontal/maxillary tenderness ENT/Mouth: Ext aud canals clear, TMs without erythema, bulging. No erythema, swelling, or exudate on post pharynx.  Tonsils not swollen or erythematous. Hearing normal.  Neck: Supple, thyroid normal.  Respiratory: Respiratory effort normal, BS equal bilaterally without rales, rhonchi, wheezing or stridor.  Cardio: RRR with no MRGs. Brisk peripheral pulses without edema.  Abdomen: Soft, + BS.  Non tender, no guarding, rebound, hernias,  masses. Lymphatics: Non tender without lymphadenopathy.  Musculoskeletal: Full ROM, 5/5 strength, {PSY - GAIT AND STATION:22860} gait Skin: Warm, dry without rashes, lesions, ecchymosis.  Neuro: Cranial nerves intact. No cerebellar symptoms.  Psych: Awake and oriented X 3, normal affect, Insight and Judgment appropriate.    Izora Ribas, NP 2:02 PM [facility]

## 2018-04-23 ENCOUNTER — Ambulatory Visit: Payer: Self-pay | Admitting: Adult Health

## 2018-05-07 NOTE — Progress Notes (Signed)
FOLLOW UP  Assessment and Plan:   Cholesterol Currently above goal; patient alleges severe statin/zetia intolerance; resins discussed, declines at this time, recommended she add soluble fiber supplement Continue low cholesterol diet and exercise.  Check lipid panel.   Prediabetes Continue diet and exercise.  Perform daily foot/skin check, notify office of any concerning changes.  Check A1C  Overweight Long discussion about weight loss, diet, and exercise Recommended diet heavy in fruits and veggies and low in animal meats, cheeses, and dairy products, appropriate calorie intake Discussed ideal weight for height and initial weight goal (145lb) Will follow up in 3 months  Vitamin D Def At goal at last visit; continue supplementation to maintain goal of 70-100 Defer Vit D level  Depression/anxiety Continue medications, reminded to limit xanax, use, avoid using daily, suggested benadryl alternatively at night for sleep  Lifestyle discussed: diet/exerise, sleep hygiene, stress management, hydration   Continue diet and meds as discussed. Further disposition pending results of labs. Discussed med's effects and SE's.   Over 30 minutes of exam, counseling, chart review, and critical decision making was performed.   Future Appointments  Date Time Provider [place]  06/04/2018  4:30 PM Unk Pinto, MD [facility] None  08/03/2018  9:30 AM Unk Pinto, MD [facility] None  01/19/2019 10:00 AM Unk Pinto, MD [facility] None    ----------------------------------------------------------------------------------------------------------------------  HPI 67 y.o. female  presents for 3 month follow up on hypertension, cholesterol, prediabetes, weight and vitamin D deficiency.   She has depression treated by zoloft 50 mg daily and PRN xanax 0.25-0.5 mg TID PRN; she is currently taking mainly at night, under a lot of stress r/t her daughter's unexpected separation.   BMI  is Body mass index is 28.17 kg/m., she hasn't had much of an appetite, has been going to gym with silver sneakers.  Wt Readings from Last 3 Encounters:  05/11/18 154 lb (69.9 kg)  12/22/17 156 lb 9.6 oz (71 kg)  09/22/17 162 lb (73.5 kg)   Her blood pressure has been controlled at home, today their BP is BP: 120/72  She does workout. She denies chest pain, shortness of breath, dizziness.   She is not on cholesterol medication; she alleges severe myalgias on statins and zetia. Her cholesterol is not at goal. The cholesterol last visit was:   Lab Results  Component Value Date   CHOL 260 (H) 12/22/2017   HDL 81 12/22/2017   LDLCALC 151 (H) 12/22/2017   LDLDIRECT 139.2 04/29/2012   TRIG 146 12/22/2017   CHOLHDL 3.2 12/22/2017    She has been working on diet and exercise for prediabetes, and denies foot ulcerations, increased appetite, nausea, paresthesia of the feet, polydipsia, polyuria, visual disturbances and vomiting. Last A1C in the office was:  Lab Results  Component Value Date   HGBA1C 5.9 (H) 12/22/2017   Patient is on Vitamin D supplement.   Lab Results  Component Value Date   VD25OH 84 12/22/2017        Current Medications:  Current Outpatient Medications on File Prior to Visit  Medication Sig  . cholecalciferol (VITAMIN D) 1000 UNITS tablet Take 5,000 Units by mouth daily.   Marland Kitchen ibuprofen (ADVIL,MOTRIN) 200 MG tablet Take 400 mg by mouth daily as needed.  . Multiple Vitamins-Minerals (ICAPS LUTEIN & OMEGA-3 PO) Take 1 capsule by mouth daily.   No current facility-administered medications on file prior to visit.      Allergies:  Allergies  Allergen Reactions  . Crestor [Rosuvastatin Calcium] Other (See  Comments)    myalgias  . Red Yeast Rice [Cholestin] Other (See Comments)    Dizzy  . Tape Other (See Comments)    Pt states that skin burns, and it hurts severely.  Tori Milks [Naproxen Sodium] Rash  . Darvon [Propoxyphene] Nausea And Vomiting and Anxiety  .  Latex Rash    Contact makes the skin feel "burned"     Medical History:  Past Medical History:  Diagnosis Date  . DCIS (ductal carcinoma in situ) of breast 04/29/2012  . HTN (hypertension)   . Hyperlipidemia   . Vitamin D deficiency    Family history- Reviewed and unchanged Social history- Reviewed and unchanged   Review of Systems:  Review of Systems  Constitutional: Negative for malaise/fatigue and weight loss.  HENT: Negative for hearing loss and tinnitus.   Eyes: Negative for blurred vision and double vision.  Respiratory: Negative for cough, shortness of breath and wheezing.   Cardiovascular: Negative for chest pain, palpitations, orthopnea, claudication and leg swelling.  Gastrointestinal: Negative for abdominal pain, blood in stool, constipation, diarrhea, heartburn, melena, nausea and vomiting.  Genitourinary: Negative.   Musculoskeletal: Positive for back pain. Negative for joint pain and myalgias.  Skin: Negative for rash.  Neurological: Negative for dizziness, tingling, sensory change, weakness and headaches.  Endo/Heme/Allergies: Negative for polydipsia.  Psychiatric/Behavioral: Negative.   All other systems reviewed and are negative.     Physical Exam: BP 120/72   Pulse 60   Temp (!) 97.5 F (36.4 C)   Ht 5\' 2"  (1.575 m)   Wt 154 lb (69.9 kg)   SpO2 98%   BMI 28.17 kg/m  Wt Readings from Last 3 Encounters:  05/11/18 154 lb (69.9 kg)  12/22/17 156 lb 9.6 oz (71 kg)  09/22/17 162 lb (73.5 kg)   General Appearance: Well nourished, in no apparent distress. Eyes: PERRLA, EOMs, conjunctiva no swelling or erythema Sinuses: No Frontal/maxillary tenderness ENT/Mouth: Ext aud canals clear, TMs without erythema, bulging. No erythema, swelling, or exudate on post pharynx.  Tonsils not swollen or erythematous. Hearing normal.  Neck: Supple, thyroid normal.  Respiratory: Respiratory effort normal, BS equal bilaterally without rales, rhonchi, wheezing or stridor.   Cardio: RRR with no MRGs. Brisk peripheral pulses without edema.  Abdomen: Soft, + BS.  Non tender, no guarding, rebound, hernias, masses. Lymphatics: Non tender without lymphadenopathy.  Musculoskeletal: Full ROM, 5/5 strength, Normal gait Skin: Warm, dry without rashes, lesions, ecchymosis.  Neuro: Cranial nerves intact. No cerebellar symptoms.  Psych: Awake and oriented X 3, normal affect, Insight and Judgment appropriate.    Izora Ribas, NP 10:44 AM [facility]

## 2018-05-11 ENCOUNTER — Encounter: Payer: Self-pay | Admitting: Adult Health

## 2018-05-11 ENCOUNTER — Ambulatory Visit (INDEPENDENT_AMBULATORY_CARE_PROVIDER_SITE_OTHER): Payer: Medicare Other | Admitting: Adult Health

## 2018-05-11 VITALS — BP 120/72 | HR 60 | Temp 97.5°F | Ht 62.0 in | Wt 154.0 lb

## 2018-05-11 DIAGNOSIS — F32A Depression, unspecified: Secondary | ICD-10-CM

## 2018-05-11 DIAGNOSIS — R7303 Prediabetes: Secondary | ICD-10-CM

## 2018-05-11 DIAGNOSIS — E782 Mixed hyperlipidemia: Secondary | ICD-10-CM

## 2018-05-11 DIAGNOSIS — E663 Overweight: Secondary | ICD-10-CM | POA: Diagnosis not present

## 2018-05-11 DIAGNOSIS — E559 Vitamin D deficiency, unspecified: Secondary | ICD-10-CM

## 2018-05-11 DIAGNOSIS — F329 Major depressive disorder, single episode, unspecified: Secondary | ICD-10-CM

## 2018-05-11 DIAGNOSIS — Z79899 Other long term (current) drug therapy: Secondary | ICD-10-CM

## 2018-05-11 MED ORDER — SERTRALINE HCL 50 MG PO TABS: ORAL_TABLET | ORAL | 1 refills | Status: DC

## 2018-05-11 MED ORDER — ALPRAZOLAM 0.5 MG PO TABS: ORAL_TABLET | ORAL | 0 refills | Status: DC

## 2018-05-11 NOTE — Patient Instructions (Signed)
Goals    . LDL CALC < 130    . Weight (lb) < 145 lb (65.8 kg)       Please add soluble fiber supplement - such as benefiber or citrucel daily for cholesterol     Preventing High Cholesterol Cholesterol is a waxy, fat-like substance that your body needs in small amounts. Your liver makes all the cholesterol that your body needs. Having high cholesterol (hypercholesterolemia) increases your risk for heart disease and stroke. Extra (excess) cholesterol comes from the food you eat, such as animal-based fat (saturated fat) from meat and some dairy products. High cholesterol can often be prevented with diet and lifestyle changes. If you already have high cholesterol, you can control it with diet and lifestyle changes, as well as medicine. What nutrition changes can be made?  Eat less saturated fat. Foods that contain saturated fat include red meat and some dairy products.  Avoid processed meats, like bacon and lunch meats.  Avoid trans fats, which are found in margarine and some baked goods.  Avoid foods and beverages that have added sugars.  Eat more fruits, vegetables, and whole grains.  Choose healthy sources of protein, such as fish, poultry, and nuts.  Choose healthy sources of fat, such as: ? Nuts. ? Vegetable oils, especially olive oil. ? Fish that have healthy fats (omega-3 fatty acids), such as mackerel or salmon. What lifestyle changes can be made?  Lose weight if you are overweight. Losing 5-10 lb (2.3-4.5 kg) can help prevent or control high cholesterol and reduce your risk for diabetes and high blood pressure. Ask your health care provider to help you with a diet and exercise plan to safely lose weight.  Get enough exercise. Do at least 150 minutes of moderate-intensity exercise each week. ? You could do this in short exercise sessions several times a day, or you could do longer exercise sessions a few times a week. For example, you could take a brisk 10-minute walk or  bike ride, 3 times a day, for 5 days a week.  Do not smoke. If you need help quitting, ask your health care provider.  Limit your alcohol intake. If you drink alcohol, limit alcohol intake to no more than 1 drink a day for nonpregnant women and 2 drinks a day for men. One drink equals 12 oz of beer, 5 oz of wine, or 1 oz of hard liquor. Why are these changes important? If you have high cholesterol, deposits (plaques) may build up on the walls of your blood vessels. Plaques make the arteries narrower and stiffer, which can restrict or block blood flow and cause blood clots to form. This greatly increases your risk for heart attack and stroke. Making diet and lifestyle changes can reduce your risk for these life-threatening conditions. What can I do to lower my risk?  Manage your risk factors for high cholesterol. Talk with your health care provider about all of your risk factors and how to lower your risk.  Manage other conditions that you have, such as diabetes or high blood pressure (hypertension).  Have your cholesterol checked at regular intervals.  Keep all follow-up visits as told by your health care provider. This is important. How is this treated? In addition to diet and lifestyle changes, your health care provider may recommend medicines to help lower cholesterol, such as a medicine to reduce the amount of cholesterol made in your liver. You may need medicine if:  Diet and lifestyle changes do not lower your cholesterol  enough.  You have high cholesterol and other risk factors for heart disease or stroke.  Take over-the-counter and prescription medicines only as told by your health care provider. Where to find more information:  American Heart Association: ThisTune.com.pt.jsp  National Heart, Lung, and Blood Institute: FrenchToiletries.com.cy Summary  High  cholesterol increases your risk for heart disease and stroke. By keeping your cholesterol level low, you can reduce your risk for these conditions.  Diet and lifestyle changes are the most important steps in preventing high cholesterol.  Work with your health care provider to manage your risk factors, and have your blood tested regularly. This information is not intended to replace advice given to you by your health care provider. Make sure you discuss any questions you have with your health care provider. Document Released: 09/03/2015 Document Revised: 04/27/2016 Document Reviewed: 04/27/2016 Elsevier Interactive Patient Education  [employer].

## 2018-05-12 ENCOUNTER — Telehealth: Payer: Self-pay | Admitting: Pharmacist

## 2018-05-12 LAB — CBC WITH DIFFERENTIAL/PLATELET
Basophils Absolute: 40 cells/uL (ref 0–200)
Basophils Relative: 0.7 %
Eosinophils Absolute: 63 cells/uL (ref 15–500)
Eosinophils Relative: 1.1 %
HCT: 42.2 % (ref 35.0–45.0)
Hemoglobin: 14.1 g/dL (ref 11.7–15.5)
Lymphs Abs: 2497 cells/uL (ref 850–3900)
MCH: 28.9 pg (ref 27.0–33.0)
MCHC: 33.4 g/dL (ref 32.0–36.0)
MCV: 86.5 fL (ref 80.0–100.0)
MPV: 11.4 fL (ref 7.5–12.5)
Monocytes Relative: 6.7 %
Neutro Abs: 2719 cells/uL (ref 1500–7800)
Neutrophils Relative %: 47.7 %
Platelets: 186 10*3/uL (ref 140–400)
RBC: 4.88 10*6/uL (ref 3.80–5.10)
RDW: 13 % (ref 11.0–15.0)
Total Lymphocyte: 43.8 %
WBC mixed population: 382 cells/uL (ref 200–950)
WBC: 5.7 10*3/uL (ref 3.8–10.8)

## 2018-05-12 LAB — COMPLETE METABOLIC PANEL WITH GFR
AG Ratio: 2 (calc) (ref 1.0–2.5)
ALT: 10 U/L (ref 6–29)
AST: 15 U/L (ref 10–35)
Albumin: 4.3 g/dL (ref 3.6–5.1)
Alkaline phosphatase (APISO): 76 U/L (ref 33–130)
BUN: 10 mg/dL (ref 7–25)
CO2: 28 mmol/L (ref 20–32)
Calcium: 10 mg/dL (ref 8.6–10.4)
Chloride: 108 mmol/L (ref 98–110)
Creat: 0.81 mg/dL (ref 0.50–0.99)
GFR, Est African American: 86 mL/min/{1.73_m2} (ref >=60)
GFR, Est Non African American: 74 mL/min/{1.73_m2} (ref >=60)
Globulin: 2.2 g/dL (calc) (ref 1.9–3.7)
Glucose, Bld: 96 mg/dL (ref 65–99)
Potassium: 4.6 mmol/L (ref 3.5–5.3)
Sodium: 146 mmol/L (ref 135–146)
Total Bilirubin: 0.6 mg/dL (ref 0.2–1.2)
Total Protein: 6.5 g/dL (ref 6.1–8.1)

## 2018-05-12 LAB — HEMOGLOBIN A1C
Hgb A1c MFr Bld: 5.8 % of total Hgb — ABNORMAL HIGH (ref <5.7)
Mean Plasma Glucose: 120 (calc)
eAG (mmol/L): 6.6 (calc)

## 2018-05-12 LAB — LIPID PANEL
Cholesterol: 256 mg/dL — ABNORMAL HIGH (ref <200)
HDL: 71 mg/dL (ref >50)
LDL Cholesterol (Calc): 160 mg/dL (calc) — ABNORMAL HIGH
Non-HDL Cholesterol (Calc): 185 mg/dL (calc) — ABNORMAL HIGH (ref <130)
Total CHOL/HDL Ratio: 3.6 (calc) (ref <5.0)
Triglycerides: 129 mg/dL (ref <150)

## 2018-05-12 LAB — TSH: TSH: 0.68 mIU/L (ref 0.40–4.50)

## 2018-05-12 NOTE — Telephone Encounter (Signed)
Pt had lipids check yesterday at PCP office. LDL increased to 160 (goal <100 for primary prevention) with lifestyle modifications. Has tried rosuvastatin and atorvastatin and ezetimibe.   Spoke with patient and she reports that she has been doing well with lifestyle changes and has lost weight. She refuses to discuss cholesterol with me as she is not going to take the medications I talked about.   She states that she has tried cholestoff for several weeks now, but that it does not seem to be working. She states that her provider recommended 2 different medications to try to lower cholesterol yesterday and she will try these (benefiber and Citrucel). She will follow up with primary care for recheck.   Advised will route to Dr. Acie Fredrickson as Juluis Rainier.

## 2018-05-12 NOTE — Telephone Encounter (Signed)
I think she will ultimately need to be started on a PCSK-9 inhibitor  Will defer to Eino Farber, Pharm D.

## 2018-05-13 NOTE — Telephone Encounter (Signed)
Left message for patient to call back for lab results and plan of care

## 2018-05-13 NOTE — Telephone Encounter (Signed)
She refused any oral or injectable therapy for cholesterol when I spoke with her. She was only willing to try the therapy her primary care physician had recommended (Benefiber or Citrucel). These increase fiber, which will help lower cholesterol minimally and likely will not bring LDL to goal. However, she was not interested in discussing any other options with me when I called. She stated "I absolutely will not take any medications you recommend."   I agree that statin or PCSK9i therapy would be ideal, but at least at this time I am not sure she will be willing to pursue any of these.

## 2018-05-18 ENCOUNTER — Telehealth: Payer: Self-pay | Admitting: Cardiovascular Disease

## 2018-05-18 NOTE — Telephone Encounter (Signed)
New Message ° ° ° ° ° ° ° ° ° °Patient returned your call °

## 2018-05-19 NOTE — Telephone Encounter (Signed)
Called patient to clarify that she is having cholesterol followed by PCP

## 2018-05-19 NOTE — Telephone Encounter (Signed)
Called patient regarding recent lab results and her conversation with Tana Coast, [facility]. Patient states that she will have her cholesterol managed by her PCP. I thanked her for the clarification.

## 2018-06-04 ENCOUNTER — Ambulatory Visit (INDEPENDENT_AMBULATORY_CARE_PROVIDER_SITE_OTHER): Payer: Medicare Other | Admitting: Internal Medicine

## 2018-06-04 VITALS — BP 132/50 | HR 76 | Temp 97.3°F | Resp 16 | Ht 62.0 in | Wt 152.2 lb

## 2018-06-04 DIAGNOSIS — R0989 Other specified symptoms and signs involving the circulatory and respiratory systems: Secondary | ICD-10-CM

## 2018-06-04 DIAGNOSIS — L820 Inflamed seborrheic keratosis: Secondary | ICD-10-CM

## 2018-06-07 ENCOUNTER — Encounter: Payer: Self-pay | Admitting: Internal Medicine

## 2018-06-07 NOTE — Progress Notes (Signed)
   Subjective:    Patient ID: Regina Shaw, female    DOB: March 19, 1951, 67 y.o.   MRN: [id number]  HPI    This very nice 67 yo WWF with hx/o labile HTN presents for recheck w/o complaints of HA's, dizziness, CP, palpitations, dyspnea or edema.    She also has concerns about a  pruritic spot on her Left low back/flank area.   Medication Sig  . ALPRAZolam 0.5 MG tablet Take 1/2 to 1 tablet 2 to 3 x/day as needed for Stress or Anxiety  . VITAMIN D  Take 5,000 Units by mouth daily.   Marland Kitchen ibuprofen  200 MG tablet Take 400 mg by mouth daily as needed.  . ICAPS LUTEIN & OMEGA-3  Take 1 capsule by mouth daily.  . Omega-3 FISH OIL  Take 1 capsule by mouth daily.  . sertraline  50 MG tablet Take 1 tablet daily for Mood   Allergies  Allergen Reactions  . Crestor [Rosuvastatin Calcium] Other (See Comments)    myalgias  . Red Yeast Rice [Cholestin] Other (See Comments)    Dizzy  . Tape Other (See Comments)    Pt states that skin burns, and it hurts severely.  Tori Milks [Naproxen Sodium] Rash  . Darvon [Propoxyphene] Nausea And Vomiting and Anxiety  . Latex Rash    Contact makes the skin feel "burned"   Past Medical History:  Diagnosis Date  . DCIS (ductal carcinoma in situ) of breast 04/29/2012  . HTN (hypertension)   . Hyperlipidemia   . Vitamin D deficiency    Review of Systems    10 point systems review negative except as above.    Objective:   Physical Exam  BP (!) 132/50   Pulse 76   Temp (!) 97.3 F (36.3 C)   Resp 16   Ht 5\' 2"  (1.575 m)   Wt 152 lb 3.2 oz (69 kg)   BMI 27.84 kg/m   HEENT - WNL. Neck - supple.  Chest - Clear equal BS. Cor - Nl HS. RRR w/o sig m. PP 1(+). No edema. MS- FROM w/o deformities.  Gait Nl. Neuro -  Nl w/o focal abnormalities. Skin - there is a 12 mm x 15 mm medium brown irritated sl raised lesion of the lower Lt flank area which appears to be an irritated/inflammed  Seborrheic keratosis   Procedure (CPT - 16384)   After informed consent  and aseptic prep with alcohol the area was anesthetized with 1 ml of Marcaine 0.5% and then sharply excised by shave technique with a #10 scalpel. The wound base was hyfrecated for hemostasis and electrodestruction of any remnant lesion. Then antibiotic ung was applied and a 2" x 4" Tegaderm dressing. Patient was instructed in post-op wound care.     Assessment & Plan:   1. Labile hypertension  2. Seborrheic keratoses, inflamed

## 2018-07-15 ENCOUNTER — Ambulatory Visit (INDEPENDENT_AMBULATORY_CARE_PROVIDER_SITE_OTHER): Payer: Medicare Other

## 2018-07-15 DIAGNOSIS — Z23 Encounter for immunization: Secondary | ICD-10-CM | POA: Diagnosis not present

## 2018-08-03 ENCOUNTER — Ambulatory Visit: Payer: Self-pay | Admitting: Internal Medicine

## 2018-08-06 ENCOUNTER — Ambulatory Visit: Payer: Self-pay | Admitting: Internal Medicine

## 2018-08-14 ENCOUNTER — Ambulatory Visit (INDEPENDENT_AMBULATORY_CARE_PROVIDER_SITE_OTHER): Payer: Medicare Other | Admitting: Physician Assistant

## 2018-08-14 ENCOUNTER — Encounter: Payer: Self-pay | Admitting: Physician Assistant

## 2018-08-14 VITALS — BP 124/66 | HR 70 | Temp 98.0°F | Ht 62.0 in | Wt 147.4 lb

## 2018-08-14 DIAGNOSIS — R21 Rash and other nonspecific skin eruption: Secondary | ICD-10-CM

## 2018-08-14 MED ORDER — PREDNISONE 20 MG PO TABS: ORAL_TABLET | ORAL | 0 refills | Status: AC

## 2018-08-14 MED ORDER — CLOBETASOL PROPIONATE 0.05 % EX OINT: 1.0000 "application " | TOPICAL_OINTMENT | Freq: Two times a day (BID) | CUTANEOUS | 0 refills | Status: DC

## 2018-08-14 MED ORDER — CEPHALEXIN 250 MG PO CAPS: 250.0000 mg | ORAL_CAPSULE | Freq: Four times a day (QID) | ORAL | 0 refills | Status: AC

## 2018-08-14 MED ORDER — FLUCONAZOLE 150 MG PO TABS: 150.0000 mg | ORAL_TABLET | Freq: Every day | ORAL | 3 refills | Status: DC

## 2018-08-14 NOTE — Patient Instructions (Addendum)
Take the keflex, if you get a yeast infection take the diflucan Do the prednisone, if you are doing better can stop early  Wrap your hands with the ointment for at least 5 night and use it twice a day  Stop the goat soap Do a mild soap   Make sure you are on an allergy pill, see below for more details. Please take the prednisone as directed below, this is NOT an antibiotic so you do NOT have to finish it. You can take it for a few days and stop it if you are doing better.   Please take the prednisone to help decrease inflammation and therefore decrease symptoms. Take it it with food to avoid GI upset. It can cause increased energy but on the other hand it can make it hard to sleep at night so please take it AT [place], it takes 8-12 hours to start working so it will NOT affect your sleeping if you take it at night with your food!!  If you are diabetic it will increase your sugars so decrease carbs and monitor your sugars closely.       Hand Dermatitis Hand dermatitis is a skin condition that causes small, itchy, raised dots or fluid-filled blisters to form over the palms of the hands. This condition may also be called hand eczema. What are the causes? The cause of this condition is not known. What increases the risk? This condition is more likely to develop in people who have a history of allergies, such as:  Hay fever.  Allergic asthma.  An allergy to latex.  Chemical exposure, injuries, and environmental irritants can make hand dermatitis worse. Washing your hands too often can remove natural oils, which can dry out the skin and contribute to outbreaks of this condition. What are the signs or symptoms? The most common symptom of this condition is intense itchiness. Cracks or grooves (fissures) on the fingers can also develop. Affected areas can be painful, especially areas where large blisters have formed. How is this diagnosed? This condition is diagnosed with a medical  history and physical exam. How is this treated? This condition is treated with medicines, including:  Steroid creams and ointments.  Oral steroid medicines.  Antibiotic medicines. These are prescribed if you have an infection.  Antihistamine medicines. These help to reduce itchiness.  Follow these instructions at home:  Take or apply over-the-counter and prescription medicines only as told by your health care provider.  If you were prescribed an antibiotic medicine, use it as told by your health care provider. Do not stop using the antibiotic even if you start to feel better.  Avoid washing your hands more often than necessary.  Avoid using harsh chemicals on your hands.  Wear protective gloves when you handle products that can irritate your skin.  Keep all follow-up visits as told by your health care provider. This is important. Contact a health care provider if:  Your rash does not improve during the first week of treatment.  Your rash is red or tender.  Your rash has pus coming from it.  Your rash spreads. This information is not intended to replace advice given to you by your health care provider. Make sure you discuss any questions you have with your health care provider. Document Released: 08/19/2005 Document Revised: 01/25/2016 Document Reviewed: 03/03/2015 Elsevier Interactive Patient Education  2018 [employer].  Pityriasis Rosea Pityriasis rosea is a rash that usually appears on the trunk of the body. It  may also appear on the upper arms and upper legs. It usually begins as a single patch, and then more patches begin to develop. The rash may cause mild itching, but it normally does not cause other problems. It usually goes away without treatment. However, it may take weeks or months for the rash to go away completely. What are the causes? The cause of this condition is not known. The condition does not spread from person to person (is noncontagious). What  increases the risk? This condition is more likely to develop in young adults and children. It is most common in the spring and fall. What are the signs or symptoms? The main symptom of this condition is a rash.  The rash usually begins with a single oval patch that is larger than the ones that follow. This is called a herald patch. It generally appears a week or more before the rest of the rash appears.  When more patches start to develop, they spread quickly on the trunk, back, and arms. These patches are smaller than the first one.  The patches that make up the rash are usually oval-shaped and pink or red in color. They are usually flat, but they may sometimes be raised so that they can be felt with a finger. They may also be finely crinkled and have a scaly ring around the edge.  The rash does not typically appear on areas of the skin that are exposed to the sun.  Most people who have this condition do not have other symptoms, but some have mild itching. In a few cases, a mild headache or body aches may occur before the rash appears and then go away. How is this diagnosed? Your health care provider may diagnose this condition by doing a physical exam and taking your medical history. To rule out other possible causes for the rash, the health care provider may order blood tests or take a skin sample from the rash to be looked at under a microscope. How is this treated? Usually, treatment is not needed for this condition. The rash will probably go away on its own in 4-8 weeks. In some cases, a health care provider may recommend or prescribe medicine to reduce itching. Follow these instructions at home:  Take medicines only as directed by your health care provider.  Avoid scratching the affected areas of skin.  Do not take hot baths or use a sauna. Use only warm water when bathing or showering. Heat can increase itching. Contact a health care provider if:  Your rash does not go away in 8  weeks.  Your rash gets much worse.  You have a fever.  You have swelling or pain in the rash area.  You have fluid, blood, or pus coming from the rash area. This information is not intended to replace advice given to you by your health care provider. Make sure you discuss any questions you have with your health care provider. Document Released: 09/25/2001 Document Revised: 01/25/2016 Document Reviewed: 07/27/2014 Elsevier Interactive Patient Education  [employer].

## 2018-08-14 NOTE — Progress Notes (Signed)
Subjective:    Patient ID: Regina Shaw, female    DOB: [birth year], 67 y.o.   MRN: [id number]  HPI 67 y.o. WF presents with rash x 3 weeks. Started on bilateral hands for a year but worse x 3 weeks, dry cracking, then for 2-3 days has noticed rash on bilateral arms and her back.  Very pruritic, states she can not sleep.  She states she feels that she has had some fever, chills, her left middle finger and right 2nd finger feel warmer than normal. She uses goat soap and lotion for a long time.   She has tried eucresa, stopped having her nails done x 9 months. Has used Cetaphil. Has tried goat milk lotion. Has done Vaseline with occlusive.    Denies specific medication, food, skin care product, detergent, soap, or other environmental triggers have been identified. She did not experience concomitant cardiopulmonary or GI symptoms.  She has no specific nasal symptom complaints today.  She is leaving for [facility] Tuesday to live with her daughters. Will find doctor there.   Medications Current Outpatient Medications on File Prior to Visit  Medication Sig  . ALPRAZolam (XANAX) 0.5 MG tablet Take 1/2 to 1 tablet 2 to 3 x/day as needed for Stress or Anxiety  . cholecalciferol (VITAMIN D) 1000 UNITS tablet Take 5,000 Units by mouth daily.   Marland Kitchen ibuprofen (ADVIL,MOTRIN) 200 MG tablet Take 400 mg by mouth daily as needed.  . Multiple Vitamins-Minerals (ICAPS LUTEIN & OMEGA-3 PO) Take 1 capsule by mouth daily.  . Omega-3 Fatty Acids (FISH OIL CONCENTRATE PO) Take 1 capsule by mouth daily.  . sertraline (ZOLOFT) 50 MG tablet Take 1 tablet daily for Mood   No current facility-administered medications on file prior to visit.     Problem list She has History of ductal carcinoma in situ (DCIS) of breast; Hyperlipidemia; Prediabetes; Vitamin D deficiency; Medication management; Depression, controlled; Lumbago with Right Sciatica; Lumbar canal stenosis; Degenerative spondylolisthesis; Osteopenia; and  Overweight (BMI 25.0-29.9) on their problem list.   Review of Systems  Constitutional: Negative.  Negative for chills and fever.  HENT: Negative.   Respiratory: Negative.   Cardiovascular: Negative.   Gastrointestinal: Negative.  Negative for diarrhea.  Genitourinary: Negative.   Musculoskeletal: Negative.  Negative for arthralgias.  Skin: Positive for rash. Negative for color change and wound.  Neurological: Negative.  Negative for dizziness.       Objective:   Physical Exam Constitutional:      General: She is not in acute distress.    Appearance: She is well-developed.  HENT:     Head: Normocephalic.     Right Ear: External ear normal.     Left Ear: External ear normal.     Nose: Nose normal.     Mouth/Throat:     Pharynx: No oropharyngeal exudate.  Eyes:     Conjunctiva/sclera: Conjunctivae normal.     Pupils: Pupils are equal, round, and reactive to light.  Cardiovascular:     Rate and Rhythm: Normal rate.  Pulmonary:     Breath sounds: Normal breath sounds.  Abdominal:     Palpations: Abdomen is soft.     Tenderness: There is no abdominal tenderness.  Musculoskeletal: Normal range of motion.  Lymphadenopathy:     Cervical: No cervical adenopathy.  Skin:    Findings: Rash present.     Comments: Bilateral hands with rash, erythematous, cracking, some mild warmth, no discharge.  Erythematous raised small papules along bilateral arms and back  with excoriations.   Neurological:     Mental Status: She is alert and oriented to person, place, and time.            Assessment & Plan:  Regina Shaw was seen today for acute visit, hand burn and rash.  Diagnoses and all orders for this visit:  Rash ? Chronic hand eczema with possible superimposed infection- will treat with prednisone, keflex and ointment Diffuse rash? Urticaria versus pityriasis rosea- will treat with prednisone Needs to follow up with PCP versus derm in [place] -     predniSONE (DELTASONE) 20 MG  tablet; 1 pill 3 x a day for 3 days, 1 pill 2 x a day x 3 days, 1 pill a day x 5 days with food -     cephALEXin (KEFLEX) 250 MG capsule; Take 1 capsule (250 mg total) by mouth 4 (four) times daily for 10 days. -     clobetasol ointment (TEMOVATE) 0.05 %; Apply 1 application topically 2 (two) times daily. -     fluconazole (DIFLUCAN) 150 MG tablet; Take 1 tablet (150 mg total) by mouth daily.

## 2018-08-17 ENCOUNTER — Ambulatory Visit: Payer: Self-pay | Admitting: Internal Medicine

## 2018-09-08 ENCOUNTER — Encounter: Payer: Self-pay | Admitting: Physician Assistant

## 2018-09-08 DIAGNOSIS — I700 Atherosclerosis of aorta: Secondary | ICD-10-CM | POA: Insufficient documentation

## 2018-09-19 ENCOUNTER — Emergency Department (HOSPITAL_COMMUNITY)
Admission: EM | Admit: 2018-09-19 | Discharge: 2018-09-19 | Disposition: A | Discharge status: Home / Self Care | Payer: No Typology Code available for payment source | Attending: Emergency Medicine | Admitting: Emergency Medicine

## 2018-09-19 ENCOUNTER — Emergency Department (HOSPITAL_COMMUNITY): Payer: No Typology Code available for payment source

## 2018-09-19 ENCOUNTER — Encounter (HOSPITAL_COMMUNITY): Payer: Self-pay

## 2018-09-19 ENCOUNTER — Other Ambulatory Visit: Payer: Self-pay

## 2018-09-19 DIAGNOSIS — Z87891 Personal history of nicotine dependence: Secondary | ICD-10-CM | POA: Diagnosis not present

## 2018-09-19 DIAGNOSIS — I10 Essential (primary) hypertension: Secondary | ICD-10-CM | POA: Diagnosis not present

## 2018-09-19 DIAGNOSIS — Y9301 Activity, walking, marching and hiking: Secondary | ICD-10-CM | POA: Insufficient documentation

## 2018-09-19 DIAGNOSIS — S52502A Unspecified fracture of the lower end of left radius, initial encounter for closed fracture: Secondary | ICD-10-CM

## 2018-09-19 DIAGNOSIS — Z79899 Other long term (current) drug therapy: Secondary | ICD-10-CM | POA: Insufficient documentation

## 2018-09-19 DIAGNOSIS — Y999 Unspecified external cause status: Secondary | ICD-10-CM | POA: Diagnosis not present

## 2018-09-19 DIAGNOSIS — W19XXXA Unspecified fall, initial encounter: Secondary | ICD-10-CM

## 2018-09-19 DIAGNOSIS — Z91040 Latex allergy status: Secondary | ICD-10-CM | POA: Insufficient documentation

## 2018-09-19 DIAGNOSIS — S52602A Unspecified fracture of lower end of left ulna, initial encounter for closed fracture: Secondary | ICD-10-CM | POA: Insufficient documentation

## 2018-09-19 DIAGNOSIS — Y92480 Sidewalk as the place of occurrence of the external cause: Secondary | ICD-10-CM | POA: Insufficient documentation

## 2018-09-19 DIAGNOSIS — S59912A Unspecified injury of left forearm, initial encounter: Secondary | ICD-10-CM | POA: Diagnosis present

## 2018-09-19 DIAGNOSIS — W010XXA Fall on same level from slipping, tripping and stumbling without subsequent striking against object, initial encounter: Secondary | ICD-10-CM | POA: Insufficient documentation

## 2018-09-19 MED ORDER — OXYCODONE-ACETAMINOPHEN 5-325 MG PO TABS: 1.0000 | ORAL_TABLET | Freq: Four times a day (QID) | ORAL | 0 refills | Status: DC | PRN

## 2018-09-19 MED ORDER — MORPHINE SULFATE (PF) 4 MG/ML IV SOLN
4.0000 mg | Freq: Once | INTRAVENOUS | Status: AC
  Administered 2018-09-19: 4 mg via INTRAVENOUS
  Filled 2018-09-19: qty 1

## 2018-09-19 MED ORDER — ONDANSETRON HCL 4 MG/2ML IJ SOLN
4.0000 mg | Freq: Once | INTRAMUSCULAR | Status: AC
  Administered 2018-09-19: 4 mg via INTRAVENOUS
  Filled 2018-09-19: qty 2

## 2018-09-19 MED ORDER — ONDANSETRON 4 MG PO TBDP: 4.0000 mg | ORAL_TABLET | Freq: Three times a day (TID) | ORAL | 0 refills | Status: DC | PRN

## 2018-09-19 MED ORDER — MORPHINE SULFATE (PF) 4 MG/ML IV SOLN
4.0000 mg | Freq: Once | INTRAVENOUS | Status: AC
  Administered 2018-09-19: 4 mg via INTRAVENOUS
  Filled 2018-09-19 (×2): qty 1

## 2018-09-19 MED ORDER — BACITRACIN ZINC 500 UNIT/GM EX OINT
TOPICAL_OINTMENT | CUTANEOUS | Status: AC
  Filled 2018-09-19: qty 0.9

## 2018-09-19 MED ORDER — ONDANSETRON HCL 4 MG/2ML IJ SOLN
4.0000 mg | Freq: Once | INTRAMUSCULAR | Status: AC
  Administered 2018-09-19: 4 mg via INTRAVENOUS
  Filled 2018-09-19 (×2): qty 2

## 2018-09-19 NOTE — ED Triage Notes (Signed)
Pt is alert and oriented x 4 and is verbally responsive. Pt accompanied by friend. Pt states that she tripped in the parking lot and fell on to left hand/wrist. Pt c/o left wrist pain 8/10. Pt has small laceration on  L. Hand ( bleeding controlled)  and obvious deformities to left wrist.

## 2018-09-19 NOTE — ED Notes (Signed)
Pt states she is extremely nauseated after the pain medication received for traction splint. PA aware and ordering something for nausea.

## 2018-09-19 NOTE — Discharge Instructions (Signed)
Please read and follow all provided instructions.  You have been seen today for a left wrist injury.  You have fractures of your ulna and radius (bones in the wrist).  We have placed you into a splint to help stabilize this injury.  Please keep the splint on as well as clean and dry.  He will need to remain in the splint until you have followed up with hand surgeon Dr. Grandville Silos.   Home care instructions: -- *PRICE in the first 24-48 hours after injury: Protect- splint Rest Ice- Do not apply ice pack directly to your splint, place towel or similar between your splint and ice/ice pack. Apply ice for 20 min, then remove for 40 min while awake Compression- splint Elevate affected extremity above the level of your heart when not walking around for the first 24-48 hours   Medications:  -Percocet-this is a narcotic/controlled substance medication that has potential addicting qualities.  We recommend that you take 1-2 tablets every 6 hours as needed for severe pain.  Do not drive or operate heavy machinery when taking this medicine as it can be sedating. Do not drink alcohol or take other sedating medications when taking this medicine for safety reasons.  Keep this out of reach of small children.  Please be aware this medicine has Tylenol in it (325 mg/tab) do not exceed the maximum dose of Tylenol in a day per over the counter recommendations should you decide to supplement with Tylenol over the counter.  - Zofran- this is an anti-nausea medicine- take every 8 hours as needed for nausea/vomiting.   We have prescribed you new medication(s) today. Discuss the medications prescribed today with your pharmacist as they can have adverse effects and interactions with your other medicines including over the counter and prescribed medications. Seek medical evaluation if you start to experience new or abnormal symptoms after taking one of these medicines, seek care immediately if you start to experience difficulty  breathing, feeling of your throat closing, facial swelling, or rash as these could be indications of a more serious allergic reaction  Follow-up instructions: We would like you to follow-up with Dr. Grandville Silos, he has a Copy.  His office will call you on Monday in order to facilitate follow-up.  Return instructions:  Please return if your digits or extremity are numb or tingling, appear gray or blue, or you have severe pain (also elevate the extremity and loosen splint or wrap if you were given one) Please return if you have redness or fevers.  Please return to the Emergency Department if you experience worsening symptoms.  Please return if you have any other emergent concerns. Additional Information:  Your vital signs today were: BP (!) 146/72    Pulse 79    Temp 98.3 F (36.8 C) (Oral)    Resp 18    SpO2 94%  If your blood pressure (BP) was elevated above 135/85 this visit, please have this repeated by your doctor within one month. ---------------

## 2018-09-19 NOTE — ED Provider Notes (Signed)
[place] DEPT Provider Note   CSN: [id number] Arrival date & time: 09/19/18  1639     History   Chief Complaint Chief Complaint  Patient presents with  . Fall  . Hand Injury    HPI VONNA BRABSON is a 68 y.o. female with a hx of DCIS of the breast s/p lumpectomy, HTN, hyperlipidemia, and depression who presents to the ED s/p mechanical fall just PTA with L hand/wrist injury and pain. Patient states she was ambulating when she tripped over an elevated portion of the sidewalk resulting in a fall forward onto an outstretched L hand. She states she did not hit her head, but did gently scrape her nose. Denies LOC. States only area of injury is the LUE, no other areas of pain. Current discomfort is an 8/10 in severity, worse with attempts to move the area, no alleviating factors. No intervention PTA. Fingers are somewhat tingly. Wound to dorsum of L hand. Last PO intake 10:00 AM. No blood thinners. Denies prodromal or current chest pain, lightheadedness, dizziness, or dyspnea. Unknown last tetanus. Patient is R hand dominant.   HPI  Past Medical History:  Diagnosis Date  . DCIS (ductal carcinoma in situ) of breast 04/29/2012  . HTN (hypertension)   . Hyperlipidemia   . Vitamin D deficiency     Patient Active Problem List   Diagnosis Date Noted  . Atherosclerosis of aorta ([place]) 09/08/2018  . Overweight (BMI 25.0-29.9) 09/21/2017  . Osteopenia 11/16/2015  . Lumbar canal stenosis 02/09/2015  . Degenerative spondylolisthesis 02/09/2015  . Lumbago with Right Sciatica 11/13/2014  . Depression, controlled 10/18/2014  . Medication management 10/17/2013  . Prediabetes 09/07/2013  . Vitamin D deficiency 09/07/2013  . Hyperlipidemia   . History of ductal carcinoma in situ (DCIS) of breast 04/29/2012    Past Surgical History:  Procedure Laterality Date  . BREAST LUMPECTOMY  09/2009   [place]    . LAMINECTOMY Right 07/18/2014   Procedure: Right Lumbar Four to Five Laminectomy for Synovial cyst;  Surgeon: Kristeen Miss, MD;  Location: [facility];  Service: Neurosurgery;  Laterality: Right;  Right L4-5 Laminectomy for Synovial cyst  . LUMBAR [facility]  . TONSILLECTOMY AND ADENOIDECTOMY  1960  . TUBAL LIGATION       OB History    Gravida  3   Para  2   Term  2   Preterm      AB  1   Living  2     SAB      TAB      Ectopic      Multiple      Live Births               Home Medications    Prior to Admission medications   Medication Sig Start Date End Date Taking? Authorizing Provider  ALPRAZolam Duanne Moron) 0.5 MG tablet Take 1/2 to 1 tablet 2 to 3 x/day as needed for Stress or Anxiety 05/11/18   Liane Comber, NP  cholecalciferol (VITAMIN D) 1000 UNITS tablet Take 5,000 Units by mouth daily.     [provider]  clobetasol ointment (TEMOVATE) 2.67 % Apply 1 application topically 2 (two) times daily. 08/14/18   Vicie Mutters, PA-C  fluconazole (DIFLUCAN) 150 MG tablet Take 1 tablet (150 mg total) by mouth daily. 08/14/18   Vicie Mutters, PA-C  ibuprofen (ADVIL,MOTRIN) 200 MG tablet Take 400 mg by  mouth daily as needed.    [provider]  Multiple Vitamins-Minerals (ICAPS LUTEIN & OMEGA-3 PO) Take 1 capsule by mouth daily.    [provider]  Omega-3 Fatty Acids (FISH OIL CONCENTRATE PO) Take 1 capsule by mouth daily.    [provider]  sertraline (ZOLOFT) 50 MG tablet Take 1 tablet daily for Mood 05/11/18   Liane Comber, NP    Family History Family History  Problem Relation Age of Onset  . Liver disease Mother        d/c at 77, acute liver failure  . COPD Father        d/c at 1  . Heart attack Brother 27  . Stroke Brother 69       same brother  . Alcohol abuse Brother        other brother    Social History Social History   Tobacco Use  . Smoking status: Former Smoker    Packs/day: 1.00    Years: 10.00    Pack  years: 10.00    Last attempt to quit: 09/02/2005    Years since quitting: 13.0  . Smokeless tobacco: Never Used  Substance Use Topics  . Alcohol use: No  . Drug use: No     Allergies   Crestor [rosuvastatin calcium]; Red yeast rice [cholestin]; Tape; Aleve [naproxen sodium]; Darvon [propoxyphene]; and Latex   Review of Systems Review of Systems  Constitutional: Negative for chills and fever.  Respiratory: Negative for shortness of breath.   Cardiovascular: Negative for chest pain.  Musculoskeletal: Positive for arthralgias.  Skin: Positive for wound.  Neurological: Negative for dizziness, syncope, weakness, light-headedness, numbness and headaches.       Positive for L finger tingling.   All other systems reviewed and are negative.    Physical Exam Updated Vital Signs BP (!) 148/82 (BP Location: Left Arm)   Pulse 71   Temp 98.3 F (36.8 C) (Oral)   Resp 20   SpO2 98%   Physical Exam Vitals signs and nursing note reviewed.  Constitutional:      General: She is not in acute distress.    Appearance: She is well-developed. She is not toxic-appearing.  HENT:     Head: Normocephalic and atraumatic. No raccoon eyes or Battle's sign.     Right Ear: No hemotympanum.     Left Ear: No hemotympanum.     Nose: No septal deviation.     Right Nostril: No septal hematoma.     Left Nostril: No septal hematoma.     Mouth/Throat:     Mouth: Mucous membranes are moist.     Pharynx: Uvula midline.  Eyes:     General:        Right eye: No discharge.        Left eye: No discharge.     Extraocular Movements: Extraocular movements intact.     Conjunctiva/sclera: Conjunctivae normal.  Neck:     Musculoskeletal: Normal range of motion and neck supple. No spinous process tenderness.  Cardiovascular:     Rate and Rhythm: Normal rate and regular rhythm.     Pulses:          Radial pulses are 2+ on the right side and 2+ on the left side.  Pulmonary:     Effort: Pulmonary effort is  normal. No respiratory distress.     Breath sounds: Normal breath sounds. No wheezing, rhonchi or rales.  Chest:     Chest wall: No tenderness.  Abdominal:     General: There is no distension.     Palpations: Abdomen is soft.     Tenderness: There is no abdominal tenderness.  Musculoskeletal:     Comments: Upper extremities: Patient has obvious deformity to the dorsum of the left wrist.  She has a small skin tear noted to the dorsum of the left hand to the mid ulnar aspect.  No active bleeding.  No obvious foreign body.  She has intact active range of motion to bilateral shoulders, elbows, and to the right wrist and all IP/MCP joints.  She is able to move the MCP/IP joints of the left hand somewhat but not able to fully flex.  She has very minimal range of motion of the left wrist.  She is tender to palpation to the distal two thirds of the forearm, the diffuse wrist, as well as the carpals and metacarpals and the MCPs of digits 1 through 5.  Most focally tender around obvious deformity to the wrist. Back: No midline tenderness.  No palpable step-off. Lower extremities: Full range of motion.  Nontender.  Skin:    General: Skin is warm and dry.     Findings: No rash.  Neurological:     Mental Status: She is alert.     Comments: Clear speech.  CN III through XII grossly intact.  Sensation grossly intact bilateral upper extremities.  Able to perform grip strength bilaterally and is symmetric.  Psychiatric:        Behavior: Behavior normal.      ED Treatments / Results  Labs (all labs ordered are listed, but only abnormal results are displayed) Labs Reviewed - No data to display  EKG None  Radiology Dg Forearm Left  Result Date: 09/19/2018 CLINICAL DATA:  Status post fall today with pain in the left hand and wrist. EXAM: LEFT FOREARM - 2 VIEW COMPARISON:  None. FINDINGS: Comminuted displaced intra-articular fracture of distal radius is identified. There is fracture of the ulna styloid.  IMPRESSION: Fracture of distal ulna and radius. Electronically Signed   By: Abelardo Diesel M.D.   On: 09/19/2018 17:51   Dg Wrist Complete Left  Result Date: 09/19/2018 CLINICAL DATA:  Status post fall with left wrist pain. EXAM: LEFT WRIST - COMPLETE 3+ VIEW COMPARISON:  None. FINDINGS: Comminuted intra-articular displaced fracture of distal radius is noted. There is fracture of the ulna styloid. IMPRESSION: Fractures of distal ulna and radius. Electronically Signed   By: Abelardo Diesel M.D.   On: 09/19/2018 17:51   Dg Hand Complete Left  Result Date: 09/19/2018 CLINICAL DATA:  Status post fall with left wrist pain. EXAM: LEFT HAND - COMPLETE 3+ VIEW COMPARISON:  None. FINDINGS: Comminuted displaced intra-articular fracture of distal radius is noted. There is fracture ulna styloid. IMPRESSION: Fractures of distal ulna and radius. Electronically Signed   By: Abelardo Diesel M.D.   On: 09/19/2018 17:52    Procedures Procedures (including critical care time)  SPLINT APPLICATION Date/Time: &:54 PM Authorized by: Kennith Maes Consent: Verbal consent obtained. Risks and benefits: risks, benefits and alternatives were discussed Consent given by: patient Splint applied by: orthopedic technician Location details: LUE Splint type: Sugar tong spint Supplies used: Sugar tong splint, applied w/ finger traction.  Post-procedure: The splinted body part was neurovascularly unchanged following the procedure. Patient tolerance: Patient tolerated the procedure well with no immediate complications.   Medications Ordered in ED Medications - No data to display   Initial Impression / Assessment and Plan /  ED Course  I have reviewed the triage vital signs and the nursing notes.  Pertinent labs & imaging results that were available during my care of the patient were reviewed by me and considered in my medical decision making (see chart for details).   Patient presents to the emergency department  status post mechanical fall with complaints of left upper extremity pain.  Patient is nontoxic-appearing, vitals WNL with the exception of elevated blood pressure, doubt HTN emergency.  No evidence of serious head, neck, or back injury.  No midline spinal tenderness.  No neuro deficits.  Injuries seem to be localized to the left upper extremity.  She has an obvious deformity to the left wrist with limitation in range of motion.  There is a small skin tear to the dorsum of the hand, this does not appear to require repair with sutures, skin adhesive, or staples, no bony protrusion.  Per chart review her last tetanus was in 2016 and is up-to-date.  X-ray's confirm comminuted displaced intra-articular fracture of the distal radius as well as an ulnar styloid fracture. She Is neurovascularly intact distally.  Her small skin tear is not over the fracture and is not deep, does not seem consistent with open fracture.  Will further discuss with hand surgery.  18:55: CONSULT: Discussed with hand surgeon Dr. Caralyn Guile is requesting that we place the patient into finger traction with 5 to 10 pounds while applying sugar tong splint.  The traction will allow for some re-alignment. No need for repeat x-ray. Discharge home with pain control.  His office will call the patient on Monday morning to facilitate follow-up.  He anticipates surgical intervention will be necessary.  Discussed traction/splinting with orthopedic technician.   20:10: RE-EVAL: Splint in place appropriately. Remains NVI distally. Patient feeling very nauseous following additional dose of morphine ordered during splinting procedure. Discussed with Dr. Tomi Bamberger- additional zofran ordered.   21:20: RE-EVAL: Patient feeling improved, tolerating PO, ready for discharge.   PRICE. Percocet for pain. [place] Controlled Substance reporting System queried. Dr. Biagio Borg office to contact patient for follow up. I discussed results, treatment plan, need for  follow-up, and return precautions with the patient & her daughter at bedside. Provided opportunity for questions, patient & her daughter confirmed understanding and are in agreement with plan.   Vitals:   09/19/18 2000 09/19/18 2130  BP: (!) 146/72 126/70  Pulse: 79 63  Resp: 18 17  Temp:    SpO2: 94% 98%     Final Clinical Impressions(s) / ED Diagnoses   Final diagnoses:  Fall, initial encounter  Closed fracture of distal ends of left radius and ulna, initial encounter    ED Discharge Orders         Ordered    oxyCODONE-acetaminophen (PERCOCET/ROXICET) 5-325 MG tablet  Every 6 hours PRN     09/19/18 2126    ondansetron (ZOFRAN ODT) 4 MG disintegrating tablet  Every 8 hours PRN     09/19/18 2126           Amaryllis Dyke, PA-C 09/19/18 2211    Dorie Rank, MD 09/20/18 0009

## 2018-09-22 ENCOUNTER — Encounter (HOSPITAL_COMMUNITY): Payer: Self-pay | Admitting: *Deleted

## 2018-09-22 ENCOUNTER — Other Ambulatory Visit: Payer: Self-pay

## 2018-09-22 NOTE — H&P (Signed)
Regina Shaw is an 68 y.o. female.   Chief Complaint: LEFT WRIST INJURY  HPI: The patient is a 68 year old right-hand-dominant female who sustained an injury to her left wrist on 09/19/18 after slipping and falling. She was seen at the emergency department due to the wrist having swelling, ecchymosis, stiffness, weakness, and pain.  She was treated with pain medicine and a sugar tong splint. She was seen in our office for further treatment.  Discussed the reasoning rationale for surgery.  The patient will remain in the sugar tong splint until the time of surgery. The patient is here today for surgery. She denies chest pain, shortness of breath, fever, chills, nausea, vomiting, and diarrhea.   Past Medical History:  Diagnosis Date  . DCIS (ductal carcinoma in situ) of breast 04/29/2012  . HTN (hypertension)   . Hyperlipidemia   . Vitamin D deficiency     Past Surgical History:  Procedure Laterality Date  . BREAST LUMPECTOMY  09/2009   [place]    . LAMINECTOMY Right 07/18/2014   Procedure: Right Lumbar Four to Five Laminectomy for Synovial cyst;  Surgeon: Kristeen Miss, MD;  Location: [facility];  Service: Neurosurgery;  Laterality: Right;  Right L4-5 Laminectomy for Synovial cyst  . LUMBAR [facility]  . TONSILLECTOMY AND ADENOIDECTOMY  1960  . TUBAL LIGATION      Family History  Problem Relation Age of Onset  . Liver disease Mother        d/c at 59, acute liver failure  . COPD Father        d/c at 55  . Heart attack Brother 21  . Stroke Brother 13       same brother  . Alcohol abuse Brother        other brother   Social History:  reports that she quit smoking about 13 years ago. She has a 10.00 pack-year smoking history. She has never used smokeless tobacco. She reports that she does not drink alcohol or use drugs.  Allergies:  Allergies  Allergen Reactions  . Crestor [Rosuvastatin Calcium] Other (See Comments)   myalgias  . Red Yeast Rice [Cholestin] Other (See Comments)    Dizzy  . Tape Other (See Comments)    Pt states that skin burns, and it hurts severely.  Tori Milks [Naproxen Sodium] Rash  . Darvon [Propoxyphene] Nausea And Vomiting and Anxiety  . Latex Rash    Contact makes the skin feel "burned"    No medications prior to admission.    No results found for this or any previous visit (from the past 48 hour(s)). No results found.  ROS  NO RECENT ILLNESSES OR HOSPITALIZATIONS  There were no vitals taken for this visit. Physical Exam  General Appearance:  Alert, cooperative, no distress, appears stated age  Head:  Normocephalic, without obvious abnormality, atraumatic  Eyes:  Pupils equal, conjunctiva/corneas clear,         Throat: Lips, mucosa, and tongue normal; teeth and gums normal  Neck: No visible masses     Lungs:   respirations unlabored  Chest Wall:  No tenderness or deformity  Heart:  Regular rate and rhythm,  Abdomen:   Soft, non-tender,         Extremities: LUE: SKIN INTACT FINGERS WARM WELL PERFUSED ABLE TO EXTEND THUMB  Pulses: 2+ and symmetric  Skin: Skin color, texture, turgor normal, no rashes or lesions     Neurologic:  Normal    Assessment LEFT DISTAL RADIUS DISPLACED FRACTURE   Plan LEFT DISTAL RADIUS OPEN REDUCTION AND INTERNAL FIXATION WITH REPAIR AS INDICATED   WE ARE PLANNING SURGERY FOR YOUR UPPER EXTREMITY. THE RISKS AND BENEFITS OF SURGERY INCLUDE BUT NOT LIMITED TO BLEEDING INFECTION, DAMAGE TO NEARBY NERVES ARTERIES TENDONS, FAILURE OF SURGERY TO ACCOMPLISH ITS INTENDED GOALS, PERSISTENT SYMPTOMS AND NEED FOR FURTHER SURGICAL INTERVENTION. WITH THIS IN MIND WE WILL PROCEED. I HAVE DISCUSSED WITH THE PATIENT THE PRE AND POSTOPERATIVE REGIMEN AND THE DOS AND DON'TS. PT VOICED UNDERSTANDING AND INFORMED CONSENT SIGNED.  R/B/A DISCUSSED WITH PT IN OFFICE.  PT VOICED UNDERSTANDING OF PLAN CONSENT SIGNED DAY OF SURGERY PT SEEN AND EXAMINED PRIOR  TO OPERATIVE PROCEDURE/DAY OF SURGERY SITE MARKED. QUESTIONS ANSWERED WILL POSSIBLYGO HOME FOLLOWING SURGERY   [facility] [place] MD 09/23/2018  Brynda Peon 09/22/2018, 3:25 PM

## 2018-09-22 NOTE — Progress Notes (Signed)
Denies chest pain or shob. States she seen Dr. Acie Fredrickson in 2018 for chest pain and has not been back after Dr. Acie Fredrickson cleared her to come back only if needed.

## 2018-09-23 ENCOUNTER — Encounter (HOSPITAL_COMMUNITY)
Admission: RE | Disposition: A | Discharge status: Home / Self Care | Payer: Self-pay | Source: Home / Self Care | Attending: Orthopedic Surgery

## 2018-09-23 ENCOUNTER — Ambulatory Visit (HOSPITAL_COMMUNITY): Payer: No Typology Code available for payment source | Admitting: Anesthesiology

## 2018-09-23 ENCOUNTER — Ambulatory Visit (HOSPITAL_COMMUNITY)
Admission: RE | Admit: 2018-09-23 | Discharge: 2018-09-23 | Disposition: A | Discharge status: Home / Self Care | Payer: No Typology Code available for payment source | Attending: Orthopedic Surgery | Admitting: Orthopedic Surgery

## 2018-09-23 ENCOUNTER — Encounter (HOSPITAL_COMMUNITY): Payer: Self-pay | Admitting: Anesthesiology

## 2018-09-23 DIAGNOSIS — E559 Vitamin D deficiency, unspecified: Secondary | ICD-10-CM | POA: Insufficient documentation

## 2018-09-23 DIAGNOSIS — I10 Essential (primary) hypertension: Secondary | ICD-10-CM | POA: Insufficient documentation

## 2018-09-23 DIAGNOSIS — S52572A Other intraarticular fracture of lower end of left radius, initial encounter for closed fracture: Secondary | ICD-10-CM | POA: Diagnosis not present

## 2018-09-23 DIAGNOSIS — F329 Major depressive disorder, single episode, unspecified: Secondary | ICD-10-CM | POA: Diagnosis not present

## 2018-09-23 DIAGNOSIS — F419 Anxiety disorder, unspecified: Secondary | ICD-10-CM | POA: Diagnosis not present

## 2018-09-23 DIAGNOSIS — W010XXA Fall on same level from slipping, tripping and stumbling without subsequent striking against object, initial encounter: Secondary | ICD-10-CM | POA: Diagnosis not present

## 2018-09-23 DIAGNOSIS — M810 Age-related osteoporosis without current pathological fracture: Secondary | ICD-10-CM | POA: Diagnosis not present

## 2018-09-23 DIAGNOSIS — Z79899 Other long term (current) drug therapy: Secondary | ICD-10-CM | POA: Diagnosis not present

## 2018-09-23 DIAGNOSIS — Z87891 Personal history of nicotine dependence: Secondary | ICD-10-CM | POA: Diagnosis not present

## 2018-09-23 DIAGNOSIS — S52502A Unspecified fracture of the lower end of left radius, initial encounter for closed fracture: Secondary | ICD-10-CM

## 2018-09-23 DIAGNOSIS — Z86000 Personal history of in-situ neoplasm of breast: Secondary | ICD-10-CM | POA: Diagnosis not present

## 2018-09-23 HISTORY — DX: Prediabetes: R73.03

## 2018-09-23 HISTORY — PX: OPEN REDUCTION INTERNAL FIXATION (ORIF) DISTAL RADIAL FRACTURE: SHX5989

## 2018-09-23 HISTORY — DX: Anxiety disorder, unspecified: F41.9

## 2018-09-23 LAB — GLUCOSE, CAPILLARY
Glucose-Capillary: 90 mg/dL (ref 70–99)
Glucose-Capillary: 89 mg/dL (ref 70–99)

## 2018-09-23 LAB — BASIC METABOLIC PANEL
Anion gap: 8 (ref 5–15)
BUN: 7 mg/dL — ABNORMAL LOW (ref 8–23)
CO2: 26 mmol/L (ref 22–32)
Calcium: 8.9 mg/dL (ref 8.9–10.3)
Chloride: 105 mmol/L (ref 98–111)
Creatinine, Ser: 0.73 mg/dL (ref 0.44–1.00)
GFR calc Af Amer: >60 mL/min (ref >60)
GFR calc non Af Amer: >60 mL/min (ref >60)
Glucose, Bld: 90 mg/dL (ref 70–99)
Potassium: 3.5 mmol/L (ref 3.5–5.1)
Sodium: 139 mmol/L (ref 135–145)

## 2018-09-23 LAB — HEMOGLOBIN: Hemoglobin: 12.5 g/dL (ref 12.0–15.0)

## 2018-09-23 SURGERY — OPEN REDUCTION INTERNAL FIXATION (ORIF) DISTAL RADIUS FRACTURE
Anesthesia: General | Site: Hand | Laterality: Left

## 2018-09-23 MED ORDER — BUPIVACAINE LIPOSOME 1.3 % IJ SUSP
INTRAMUSCULAR | Status: DC | PRN
  Administered 2018-09-23: 10 mL via PERINEURAL

## 2018-09-23 MED ORDER — OXYCODONE HCL 5 MG PO TABS: 5.0000 mg | ORAL_TABLET | Freq: Once | ORAL | Status: AC | PRN

## 2018-09-23 MED ORDER — 0.9 % SODIUM CHLORIDE (POUR BTL) OPTIME
TOPICAL | Status: DC | PRN
  Administered 2018-09-23: 1000 mL

## 2018-09-23 MED ORDER — PHENYLEPHRINE 40 MCG/ML (10ML) SYRINGE FOR IV PUSH (FOR BLOOD PRESSURE SUPPORT)
PREFILLED_SYRINGE | INTRAVENOUS | Status: AC
  Filled 2018-09-23: qty 10

## 2018-09-23 MED ORDER — PROPOFOL 1000 MG/100ML IV EMUL
INTRAVENOUS | Status: AC
  Filled 2018-09-23: qty 100

## 2018-09-23 MED ORDER — LACTATED RINGERS IV SOLN
INTRAVENOUS | Status: DC
  Administered 2018-09-23: 15:00:00 via INTRAVENOUS

## 2018-09-23 MED ORDER — OXYCODONE HCL 5 MG/5ML PO SOLN
ORAL | Status: AC
  Filled 2018-09-23: qty 5

## 2018-09-23 MED ORDER — MEPERIDINE HCL 50 MG/ML IJ SOLN: 6.2500 mg | INTRAMUSCULAR | Status: DC | PRN

## 2018-09-23 MED ORDER — FENTANYL CITRATE (PF) 100 MCG/2ML IJ SOLN
INTRAMUSCULAR | Status: AC
  Administered 2018-09-23: 100 ug
  Filled 2018-09-23: qty 2

## 2018-09-23 MED ORDER — ONDANSETRON HCL 4 MG/2ML IJ SOLN
INTRAMUSCULAR | Status: AC
  Filled 2018-09-23: qty 4

## 2018-09-23 MED ORDER — ONDANSETRON HCL 4 MG/2ML IJ SOLN
INTRAMUSCULAR | Status: AC
  Filled 2018-09-23: qty 2

## 2018-09-23 MED ORDER — CHLORHEXIDINE GLUCONATE 4 % EX LIQD: 60.0000 mL | Freq: Once | CUTANEOUS | Status: DC

## 2018-09-23 MED ORDER — DEXAMETHASONE SODIUM PHOSPHATE 10 MG/ML IJ SOLN
INTRAMUSCULAR | Status: AC
  Filled 2018-09-23: qty 1

## 2018-09-23 MED ORDER — BUPIVACAINE HCL (PF) 0.25 % IJ SOLN
INTRAMUSCULAR | Status: DC | PRN
  Administered 2018-09-23: 5 mL

## 2018-09-23 MED ORDER — OXYCODONE HCL 5 MG/5ML PO SOLN
5.0000 mg | Freq: Once | ORAL | Status: AC | PRN
  Administered 2018-09-23: 5 mg via ORAL

## 2018-09-23 MED ORDER — DEXAMETHASONE SODIUM PHOSPHATE 10 MG/ML IJ SOLN
INTRAMUSCULAR | Status: DC | PRN
  Administered 2018-09-23: 4 mg via INTRAVENOUS

## 2018-09-23 MED ORDER — PROPOFOL 500 MG/50ML IV EMUL
INTRAVENOUS | Status: DC | PRN
  Administered 2018-09-23: 100 ug/kg/min via INTRAVENOUS

## 2018-09-23 MED ORDER — BUPIVACAINE-EPINEPHRINE (PF) 0.5% -1:200000 IJ SOLN
INTRAMUSCULAR | Status: DC | PRN
  Administered 2018-09-23: 20 mL via PERINEURAL

## 2018-09-23 MED ORDER — FENTANYL CITRATE (PF) 100 MCG/2ML IJ SOLN: 25.0000 ug | INTRAMUSCULAR | Status: DC | PRN

## 2018-09-23 MED ORDER — PROPOFOL 10 MG/ML IV BOLUS
INTRAVENOUS | Status: DC | PRN
  Administered 2018-09-23: 150 mg via INTRAVENOUS
  Administered 2018-09-23 (×2): 20 mg via INTRAVENOUS

## 2018-09-23 MED ORDER — ONDANSETRON HCL 4 MG/2ML IJ SOLN: 4.0000 mg | Freq: Once | INTRAMUSCULAR | Status: DC | PRN

## 2018-09-23 MED ORDER — PROPOFOL 10 MG/ML IV BOLUS
INTRAVENOUS | Status: AC
  Filled 2018-09-23: qty 20

## 2018-09-23 MED ORDER — MIDAZOLAM HCL 2 MG/2ML IJ SOLN
INTRAMUSCULAR | Status: AC
  Administered 2018-09-23: 1 mg
  Filled 2018-09-23: qty 2

## 2018-09-23 MED ORDER — BUPIVACAINE HCL (PF) 0.25 % IJ SOLN
INTRAMUSCULAR | Status: AC
  Filled 2018-09-23: qty 30

## 2018-09-23 MED ORDER — ROCURONIUM BROMIDE 50 MG/5ML IV SOSY
PREFILLED_SYRINGE | INTRAVENOUS | Status: AC
  Filled 2018-09-23: qty 10

## 2018-09-23 MED ORDER — CEFAZOLIN SODIUM-DEXTROSE 2-4 GM/100ML-% IV SOLN
2.0000 g | INTRAVENOUS | Status: AC
  Administered 2018-09-23: 2 g via INTRAVENOUS
  Filled 2018-09-23: qty 100

## 2018-09-23 MED ORDER — FENTANYL CITRATE (PF) 250 MCG/5ML IJ SOLN
INTRAMUSCULAR | Status: AC
  Filled 2018-09-23: qty 5

## 2018-09-23 MED ORDER — LIDOCAINE 2% (20 MG/ML) 5 ML SYRINGE
INTRAMUSCULAR | Status: AC
  Filled 2018-09-23: qty 5

## 2018-09-23 MED ORDER — FENTANYL CITRATE (PF) 100 MCG/2ML IJ SOLN
INTRAMUSCULAR | Status: DC | PRN
  Administered 2018-09-23: 25 ug via INTRAVENOUS
  Administered 2018-09-23: 50 ug via INTRAVENOUS

## 2018-09-23 MED ORDER — ONDANSETRON HCL 4 MG/2ML IJ SOLN
INTRAMUSCULAR | Status: DC | PRN
  Administered 2018-09-23: 4 mg via INTRAVENOUS

## 2018-09-23 SURGICAL SUPPLY — 62 items
BANDAGE ACE 3X5.8 VEL STRL LF (GAUZE/BANDAGES/DRESSINGS) ×3 IMPLANT
BANDAGE ACE 4X5 VEL STRL LF (GAUZE/BANDAGES/DRESSINGS) ×2 IMPLANT
BIT DRILL 2.2 SS TIBIAL (BIT) ×1 IMPLANT
BLADE CLIPPER SURG (BLADE) IMPLANT
BNDG CMPR 9X4 STRL LF SNTH (GAUZE/BANDAGES/DRESSINGS) ×1
BNDG ESMARK 4X9 LF (GAUZE/BANDAGES/DRESSINGS) ×2 IMPLANT
BNDG GAUZE ELAST 4 BULKY (GAUZE/BANDAGES/DRESSINGS) ×2 IMPLANT
CANISTER SUCT 3000ML PPV (MISCELLANEOUS) ×2 IMPLANT
CORD BI POLAR (MISCELLANEOUS) ×2 IMPLANT
COVER SURGICAL LIGHT HANDLE (MISCELLANEOUS) ×2 IMPLANT
COVER WAND RF STERILE (DRAPES) ×2 IMPLANT
CUFF TOURNIQUET SINGLE 18IN (TOURNIQUET CUFF) ×2 IMPLANT
CUFF TOURNIQUET SINGLE 24IN (TOURNIQUET CUFF) IMPLANT
DECANTER SPIKE VIAL GLASS SM (MISCELLANEOUS) ×2 IMPLANT
DRAPE OEC MINIVIEW 54X84 (DRAPES) ×2 IMPLANT
DRAPE SURG 17X11 SM STRL (DRAPES) ×2 IMPLANT
DRSG ADAPTIC 3X8 NADH LF (GAUZE/BANDAGES/DRESSINGS) ×2 IMPLANT
DRSG XEROFORM 1X8 (GAUZE/BANDAGES/DRESSINGS) ×1 IMPLANT
GAUZE 4X4 16PLY RFD (DISPOSABLE) ×2 IMPLANT
GAUZE SPONGE 4X4 12PLY STRL (GAUZE/BANDAGES/DRESSINGS) ×2 IMPLANT
GAUZE XEROFORM 5X9 LF (GAUZE/BANDAGES/DRESSINGS) ×2 IMPLANT
GLOVE BIOGEL PI IND STRL 8.5 (GLOVE) ×1 IMPLANT
GLOVE BIOGEL PI INDICATOR 8.5 (GLOVE) ×1
GLOVE SURG ORTHO 8.0 STRL STRW (GLOVE) ×2 IMPLANT
GOWN STRL REUS W/ TWL LRG LVL3 (GOWN DISPOSABLE) ×1 IMPLANT
GOWN STRL REUS W/ TWL XL LVL3 (GOWN DISPOSABLE) ×1 IMPLANT
GOWN STRL REUS W/TWL LRG LVL3 (GOWN DISPOSABLE) ×2
GOWN STRL REUS W/TWL XL LVL3 (GOWN DISPOSABLE) ×2
K-WIRE 1.6 (WIRE) ×4
K-WIRE FX5X1.6XNS BN SS (WIRE) ×2
KIT BASIN OR (CUSTOM PROCEDURE TRAY) ×2 IMPLANT
KIT TURNOVER KIT B (KITS) ×2 IMPLANT
KWIRE FX5X1.6XNS BN SS (WIRE) IMPLANT
NDL HYPO 25X1 1.5 SAFETY (NEEDLE) ×1 IMPLANT
NEEDLE HYPO 25X1 1.5 SAFETY (NEEDLE) ×2 IMPLANT
NS IRRIG 1000ML POUR BTL (IV SOLUTION) ×2 IMPLANT
PACK ORTHO EXTREMITY (CUSTOM PROCEDURE TRAY) ×2 IMPLANT
PAD ARMBOARD 7.5X6 YLW CONV (MISCELLANEOUS) ×4 IMPLANT
PAD CAST 4YDX4 CTTN HI CHSV (CAST SUPPLIES) ×1 IMPLANT
PADDING CAST COTTON 4X4 STRL (CAST SUPPLIES) ×4
PEG LOCK SMOOTH 2.2X18MM STE (Peg) ×1 IMPLANT
PEG LOCK SMOOTH 2.2X20MM STE (Peg) ×3 IMPLANT
PEG LOCK SMOOTH 2.2X22MM STE (Peg) ×2 IMPLANT
PLATE STANDARD DVR LEFT (Plate) ×2 IMPLANT
PLATE STD DVR LT 24X51 (Plate) IMPLANT
PUTTY DBM STAGRAFT PLUS 2CC (Putty) ×1 IMPLANT
SCREW LOCK 14X2.7X 3 LD TPR (Screw) IMPLANT
SCREW LOCKING 2.7X13MM (Screw) ×2 IMPLANT
SCREW LOCKING 2.7X14 (Screw) ×6 IMPLANT
SCREW MULTI DIRECTIONAL 2.7X16 (Screw) ×1 IMPLANT
SOAP 2 % CHG 4 OZ (WOUND CARE) ×2 IMPLANT
SPONGE LAP 4X18 RFD (DISPOSABLE) ×2 IMPLANT
SUT PROLENE 4 0 PS 2 18 (SUTURE) IMPLANT
SUT VIC AB 2-0 CT1 27 (SUTURE)
SUT VIC AB 2-0 CT1 TAPERPNT 27 (SUTURE) IMPLANT
SUT VICRYL 4-0 PS2 18IN ABS (SUTURE) IMPLANT
SYR CONTROL 10ML LL (SYRINGE) IMPLANT
TOWEL NATURAL 10PK STERILE (DISPOSABLE) ×2 IMPLANT
TOWEL NATURAL 6PK STERILE (DISPOSABLE) IMPLANT
TUBE CONNECTING 12X1/4 (SUCTIONS) ×2 IMPLANT
WATER STERILE IRR 1000ML POUR (IV SOLUTION) ×2 IMPLANT
YANKAUER SUCT BULB TIP NO VENT (SUCTIONS) IMPLANT

## 2018-09-23 NOTE — Discharge Instructions (Signed)
KEEP BANDAGE CLEAN AND DRY CALL OFFICE FOR F/U APPT [phone] in 2 weeks Dr. Caralyn Guile [phone] RX sent [facility]  [address] [facility] [place] KEEP HAND ELEVATED ABOVE HEART OK TO APPLY ICE TO OPERATIVE AREA CONTACT OFFICE IF ANY WORSENING PAIN OR CONCERNS.   Post Anesthesia Home Care Instructions  Activity: Get plenty of rest for the remainder of the day. A responsible individual must stay with you for 24 hours following the procedure.  For the next 24 hours, DO NOT: -Drive a car -Paediatric nurse -Drink alcoholic beverages -Take any medication unless instructed by your physician -Make any legal decisions or sign important papers.  Meals: Start with liquid foods such as gelatin or soup. Progress to regular foods as tolerated. Avoid greasy, spicy, heavy foods. If nausea and/or vomiting occur, drink only clear liquids until the nausea and/or vomiting subsides. Call your physician if vomiting continues.  Special Instructions/Symptoms: Your throat may feel dry or sore from the anesthesia or the breathing tube placed in your throat during surgery. If this causes discomfort, gargle with warm salt water. The discomfort should disappear within 24 hours.  If you had a scopolamine patch placed behind your ear for the management of post- operative nausea and/or vomiting:  1. The medication in the patch is effective for 72 hours, after which it should be removed.  Wrap patch in a tissue and discard in the trash. Wash hands thoroughly with soap and water. 2. You may remove the patch earlier than 72 hours if you experience unpleasant side effects which may include dry mouth, dizziness or visual disturbances. 3. Avoid touching the patch. Wash your hands with soap and water after contact with the patch.    Regional Anesthesia Blocks  1. Numbness or the inability to move the "blocked" extremity may last from 3-48 hours after placement. The length of time depends on the medication injected and your  individual response to the medication. If the numbness is not going away after 48 hours, call your surgeon.  2. The extremity that is blocked will need to be protected until the numbness is gone and the  Strength has returned. Because you cannot feel it, you will need to take extra care to avoid injury. Because it may be weak, you may have difficulty moving it or using it. You may not know what position it is in without looking at it while the block is in effect.  3. For blocks in the legs and feet, returning to weight bearing and walking needs to be done carefully. You will need to wait until the numbness is entirely gone and the strength has returned. You should be able to move your leg and foot normally before you try and bear weight or walk. You will need someone to be with you when you first try to ensure you do not fall and possibly risk injury.  4. Bruising and tenderness at the needle site are common side effects and will resolve in a few days.  5. Persistent numbness or new problems with movement should be communicated to the surgeon or the [facility] [id number] [place] [phone]).

## 2018-09-23 NOTE — Anesthesia Preprocedure Evaluation (Addendum)
Anesthesia Evaluation  Patient identified by MRN, date of birth, ID band Patient awake    Reviewed: Allergy & Precautions, NPO status , Patient's Chart, lab work & pertinent test results  Airway Mallampati: II  TM Distance: >3 FB Neck ROM: Full    Dental  (+) Chipped, Caps   Pulmonary former smoker,    Pulmonary exam normal breath sounds clear to auscultation       Cardiovascular hypertension, Normal cardiovascular exam Rhythm:Regular Rate:Normal     Neuro/Psych PSYCHIATRIC DISORDERS Anxiety Depression negative neurological ROS     GI/Hepatic negative GI ROS, Neg liver ROS,   Endo/Other  Hx/o DCIS  Renal/GU negative Renal ROS  negative genitourinary   Musculoskeletal Left distal radius Fx Chronic back pain Spondylolisthesis lumbar spine   Abdominal   Peds  Hematology negative hematology ROS (+)   Anesthesia Other Findings   Reproductive/Obstetrics                           Anesthesia Physical Anesthesia Plan  ASA: II  Anesthesia Plan: General   Post-op Pain Management:  Regional for Post-op pain   Induction: Intravenous  PONV Risk Score and Plan: 4 or greater and Ondansetron, Dexamethasone and Treatment may vary due to age or medical condition  Airway Management Planned: LMA  Additional Equipment:   Intra-op Plan:   Post-operative Plan: Extubation in OR  Informed Consent: I have reviewed the patients History and Physical, chart, labs and discussed the procedure including the risks, benefits and alternatives for the proposed anesthesia with the patient or authorized representative who has indicated his/her understanding and acceptance.     Dental advisory given  Plan Discussed with: CRNA and Surgeon  Anesthesia Plan Comments:       Anesthesia Quick Evaluation

## 2018-09-23 NOTE — Op Note (Signed)
PREOPERATIVE DIAGNOSIS:left wrist intra-articular distal radius fracture Osteoporosis  POSTOPERATIVE DIAGNOSIS:same  ATTENDING SURGEON:Dr. Gavin Pound who was scrubbed and present for the entire procedure  ASSISTANT SURGEON:Samantha Tenny Craw was scrubbed and necessary for reduction internal fixation closure and splinting in a timely fashion  ANESTHESIA:Gen. Via LMA with regional block  OPERATIVE PROCEDURE: #1: Open treatment of left wrist intra-articular distal radius fracture 3 more fragments #2: Left wrist brachia radialis tendon tenotomy, tendon release #3: Radiographs 3 views left wrist  IMPLANTS:Biomet DVR cross lock with 2 mL of bone graft stay graft substitute  RADIOGRAPHIC INTERPRETATION:AP lateral oblique views of the wrist to show the internal fixation and good position with good restoration of the radial height inclination and tilt  SURGICAL INDICATIONS:patient is right-hand-dominant female who fell while at work sustaining the closed injury to her left wrist. Patient was seen and evaluated in the office and recommended undergo the above procedure. Risks of surgery include but not limited to bleeding infection damage to nearby nerves arteries or tendons nonunion malunion hardware failure loss of motion of the wrists and digits and need for further surgical intervention.  SURGICAL TECHNIQUE:patient is properly identified in the preoperative holding area marked for permanent marker made on the left hand indicate correct operative site. Patient brought back to operating placed supine on anesthesia and table where the regional block had been performed. General anesthetic was administered via LMA. Preoperative antibiotics were given prior any skin incision. A well-padded tourniquet was then placed on the left brachium and sealed with the appropriate drape. A timeout was called the correct site was identified and the procedure then begun. A longitudinal incision made directly over the  flexor carpi radialis. Dissection carried out through the skin and subcutaneous tissue. The FCR sheath was opened proximally and distally. Going through the floor the FCR sheath the FPL was then identified and the pronator quadratus was then elevated in an L-shaped fashion. In order to reduce the comminuted highly comminuted radial column the brachial radialis was then carefully released and tendon tenotomy was then done along the radial margin. Following this open reduction was then performed. This was an intra-articular fracture 3 more fragments. The standard DVR cross lock plate was then used and held distally with a K wire. Plate position was then confirmed. Given the significant metaphyseal comminution 2 mL of bone graft substitute was then packed into the defect to help support the articular margins. Following this the plate was then properly adjusted. The oblong screw hole was then placed proximally. Following this distal fixation was carried out from an ulnar to radial direction of the distal locking pegs and one multidirectional screw. Final shaft fixation was carried out with a combination of locking and nonlocking screws. Thorough wound irrigation done. The pronator quadratus was then closed with 2-0 Vicryl. The subcutaneous tissues closed with 4-0 Vicryl. The tourniquet deflated. The skin was then closed using simple Prolene sutures. Adaptic dressing a sterile compressive bandage then applied. The patient is then placed in a well-padded sugar tong splint and extubated taken recovery room in good condition.  POSTOPERATIVE PLAN:patient be discharged to home. Seen back in the office in 2 weeks for wound check suture removal x-rays application of a short arm cast place the therapy order at the first postoperative visit and then begin a therapy regimen at the four-week mark. Radiographs at each visit.

## 2018-09-23 NOTE — Anesthesia Procedure Notes (Signed)
Procedure Name: LMA Insertion Date/Time: 09/23/2018 5:12 PM Performed by: Inda Coke, CRNA Pre-anesthesia Checklist: Patient identified, Emergency Drugs available, Suction available and Patient being monitored Patient Re-evaluated:Patient Re-evaluated prior to induction Oxygen Delivery Method: Circle System Utilized Preoxygenation: Pre-oxygenation with 100% oxygen Induction Type: IV induction Ventilation: Mask ventilation without difficulty LMA: LMA inserted LMA Size: 4.0 Number of attempts: 1 Airway Equipment and Method: Bite block Placement Confirmation: positive ETCO2 Tube secured with: Tape Dental Injury: Teeth and Oropharynx as per pre-operative assessment

## 2018-09-23 NOTE — Transfer of Care (Signed)
Immediate Anesthesia Transfer of Care Note  Patient: Regina Shaw  Procedure(s) Performed: OPEN REDUCTION INTERNAL FIXATION (ORIF) DISTAL RADIAL FRACTURE (Left Hand)  Patient Location: PACU  Anesthesia Type:GA combined with regional for post-op pain  Level of Consciousness: awake and drowsy  Airway & Oxygen Therapy: Patient Spontanous Breathing and Patient connected to nasal cannula oxygen  Post-op Assessment: Report given to RN and Post -op Vital signs reviewed and stable  Post vital signs: Reviewed and stable  Last Vitals:  Vitals Value Taken Time  BP 147/78 09/23/2018  6:05 PM  Temp    Pulse 72 09/23/2018  6:08 PM  Resp 13 09/23/2018  6:08 PM  SpO2 96 % 09/23/2018  6:08 PM  Vitals shown include unvalidated device data.  Last Pain:  Vitals:   09/23/18 1427  TempSrc: Oral         Complications: No apparent anesthesia complications

## 2018-09-23 NOTE — Anesthesia Procedure Notes (Signed)
Anesthesia Regional Block: Supraclavicular block   Pre-Anesthetic Checklist: ,, timeout performed, Correct Patient, Correct Site, Correct Laterality, Correct Procedure, Correct Position, site marked, Risks and benefits discussed,  Surgical consent,  Pre-op evaluation,  At surgeon's request and post-op pain management  Laterality: Left  Prep: chloraprep       Needles:  Injection technique: Single-shot  Needle Type: Echogenic Stimulator Needle     Needle Length: 9cm  Needle Gauge: 21   Needle insertion depth: 5 cm   Additional Needles:   Procedures:,,,, ultrasound used (permanent image in chart),,,,  Narrative:  Start time: 09/23/2018 3:43 PM End time: 09/23/2018 3:48 PM Injection made incrementally with aspirations every 5 mL.  Performed by: Personally  Anesthesiologist: Josephine Igo, MD  Additional Notes: Timeout performed. Patient sedated. Relevant anatomy ID'd using [place]. Incremental 2-60ml injection of LA with frequent aspiration. Patient tolerated procedure well.        Left Supraclavicular Block

## 2018-09-24 NOTE — Anesthesia Postprocedure Evaluation (Signed)
Anesthesia Post Note  Patient: Regina Shaw  Procedure(s) Performed: OPEN REDUCTION INTERNAL FIXATION (ORIF) DISTAL RADIAL FRACTURE (Left Hand)     Patient location during evaluation: PACU Anesthesia Type: General Level of consciousness: awake and alert Pain management: pain level controlled Vital Signs Assessment: post-procedure vital signs reviewed and stable Respiratory status: spontaneous breathing, nonlabored ventilation and respiratory function stable Cardiovascular status: blood pressure returned to baseline and stable Postop Assessment: no apparent nausea or vomiting Anesthetic complications: no    Last Vitals:  Vitals:   09/23/18 1839 09/23/18 1850  BP: 98/81 (!) 117/96  Pulse: 78 76  Resp: 16 20  Temp:    SpO2: 96% 92%    Last Pain:  Vitals:   09/23/18 1830  TempSrc:   PainSc: 5                  Audry Pili

## 2018-09-28 ENCOUNTER — Encounter (HOSPITAL_COMMUNITY): Payer: Self-pay | Admitting: Orthopedic Surgery

## 2019-01-05 ENCOUNTER — Ambulatory Visit: Payer: Medicare Other | Admitting: Adult Health Nurse Practitioner

## 2019-01-05 ENCOUNTER — Encounter: Payer: Self-pay | Admitting: Adult Health Nurse Practitioner

## 2019-01-05 ENCOUNTER — Other Ambulatory Visit: Payer: Self-pay

## 2019-01-05 VITALS — BP 134/75 | Temp 97.3°F | Ht 62.0 in | Wt 143.0 lb

## 2019-01-05 DIAGNOSIS — R7303 Prediabetes: Secondary | ICD-10-CM | POA: Diagnosis not present

## 2019-01-05 DIAGNOSIS — E782 Mixed hyperlipidemia: Secondary | ICD-10-CM

## 2019-01-05 DIAGNOSIS — E559 Vitamin D deficiency, unspecified: Secondary | ICD-10-CM | POA: Diagnosis not present

## 2019-01-05 DIAGNOSIS — Z7189 Other specified counseling: Secondary | ICD-10-CM

## 2019-01-05 DIAGNOSIS — Z0001 Encounter for general adult medical examination with abnormal findings: Secondary | ICD-10-CM

## 2019-01-05 DIAGNOSIS — F329 Major depressive disorder, single episode, unspecified: Secondary | ICD-10-CM

## 2019-01-05 DIAGNOSIS — L820 Inflamed seborrheic keratosis: Secondary | ICD-10-CM

## 2019-01-05 DIAGNOSIS — Z0000 Encounter for general adult medical examination without abnormal findings: Secondary | ICD-10-CM

## 2019-01-05 DIAGNOSIS — Z6826 Body mass index (BMI) 26.0-26.9, adult: Secondary | ICD-10-CM

## 2019-01-05 DIAGNOSIS — R6889 Other general symptoms and signs: Secondary | ICD-10-CM | POA: Diagnosis not present

## 2019-01-05 DIAGNOSIS — F32A Depression, unspecified: Secondary | ICD-10-CM

## 2019-01-05 DIAGNOSIS — Z7185 Encounter for immunization safety counseling: Secondary | ICD-10-CM

## 2019-01-05 DIAGNOSIS — R0989 Other specified symptoms and signs involving the circulatory and respiratory systems: Secondary | ICD-10-CM

## 2019-01-05 DIAGNOSIS — Z79899 Other long term (current) drug therapy: Secondary | ICD-10-CM

## 2019-01-05 NOTE — Progress Notes (Signed)
Virtual Visit via Telephone Note  I connected with Regina Shaw on 01/05/19 at 11:15 AM EDT by telephone and verified that I am speaking with the correct person using two identifiers.   I discussed the limitations, risks, security and privacy concerns of performing an evaluation and management service by telephone and the availability of in person appointments. I also discussed with the patient that there may be a patient responsible charge related to this service. The patient expressed understanding and agreed to proceed.   MEDICARE ANNUAL WELLNESS VISIT AND FOLLOW UP Assessment:    Regina Shaw was seen today for medicare wellness.  Diagnoses and all orders for this visit:  Medicare annual wellness visit, subsequent Yearly  Labile hypertension Continue to monitor Continue healthy eating habits and exercise Doing well  BMI 26.0-26.9,adult Discussed dietary and exercise modifications  Hyperlipidemia, mixed Discussed dietary and exercise modifications No medications at this time Will check labs next office visit  Prediabetes Doing well Continue healthy eating habits and exercise  Vitamin D deficiency Taking supplementation Will check level next office visit  Depression, controlled Doing well No medications at this time  Medication management Continued  Seborrheic keratoses, inflamed Using OTC protective ointment Discussed second dermatology opinion? Hematology?  Immunization counseling Discussed Shingrix, local pharm Desired to have Tdap vaccination as her daughter is due at end of month.  Vaccinated in 2016.  Discussed this is good for 10 years. Still wants vaccination. Will discussed at next office visit/CPE  Follow Up Instructions:    I discussed the assessment and treatment plan with the patient. The patient was provided an opportunity to ask questions and all were answered. The patient agreed with the plan and demonstrated an understanding of the  instructions.   The patient was advised to call back or seek an in-person evaluation if the symptoms worsen or if the condition fails to improve as anticipated.  I provided 30 minutes of non-face-to-face time during this encounter including counseling, chart review, and critical decision making was preformed.   Future Appointments  Date Time Provider [place]  01/19/2019 10:00 AM Unk Pinto, MD [facility] None  01/28/2020 10:30 AM Garnet Sierras, NP [facility] None      Plan:   During the course of the visit the patient was educated and counseled about appropriate screening and preventive services including:    Pneumococcal vaccine   Influenza vaccine  Prevnar 13  Td vaccine  Screening electrocardiogram, deferred, telephone visit [place].  Colorectal cancer screening  Diabetes screening  Glaucoma screening  Nutrition counseling    Subjective:  Regina Shaw is a 68 y.o. female who presents for Medicare Annual Wellness Visit and 3 month follow up for labile HTN not on medication, hyperlipidemia, prediabetes, chronic seborrheic keratosis inflamed, and vitamin D Def.   On 09/19/18 she had a fall after going for a run.  She stepped off the curb onto what she thought was dirt but was slippery mud and she slid with [place] to left hand. Closed fracture of distal end of left radius with ORIF, plates and pins during hospital admission.  She is now doing physical therapy and reports this is going well.  She just had a follow up with Dr Caralyn Guile and he recommends therapy for one more month. She reports her pain is controlled.    Reports she has a rash that is on her legs and top of her hands she has battled with for years.  She has been evaluated in our office as well  as dermatology and unable to find a regiment to resolve this.  Reports it is itchy and scaly, no open areas at this time.  She has most recently tried protective ointment, ie diaper rash cream, that has  helped the most.  Her blood pressure has been controlled at home, today their BP is BP: 134/75 She does workout. She denies chest pain, shortness of breath, dizziness.  She is not on cholesterol medication and denies myalgias. Her cholesterol is not at goal. The cholesterol last visit was:   Lab Results  Component Value Date   CHOL 256 (H) 05/11/2018   HDL 71 05/11/2018   LDLCALC 160 (H) 05/11/2018   LDLDIRECT 139.2 04/29/2012   TRIG 129 05/11/2018   CHOLHDL 3.6 05/11/2018   She has been working on diet and exercise for prediabetes, and denies hyperglycemia, hypoglycemia , increased appetite, nausea, paresthesia of the feet, polydipsia, polyuria, visual disturbances and vomiting. Last A1C in the office was:  Lab Results  Component Value Date   HGBA1C 5.8 (H) 05/11/2018   Last GFR Lab Results  Component Value Date   GFRNONAA >60 09/23/2018     Lab Results  Component Value Date   GFRAA >60 09/23/2018   Patient is on Vitamin D supplement.   Lab Results  Component Value Date   VD25OH 84 12/22/2017      Medication Review:     Current Outpatient Medications (Analgesics):  .  ibuprofen (ADVIL,MOTRIN) 200 MG tablet, Take 400 mg by mouth daily as needed for moderate pain.  Marland Kitchen  oxyCODONE-acetaminophen (PERCOCET/ROXICET) 5-325 MG tablet, Take 1-2 tablets by mouth every 6 (six) hours as needed for severe pain.   Current Outpatient Medications (Other):  .  cholecalciferol (VITAMIN D) 1000 UNITS tablet, Take 5,000 Units by mouth daily.  .  Multiple Vitamins-Minerals (ICAPS LUTEIN & OMEGA-3 PO), Take 1 capsule by mouth daily. Marland Kitchen  ALPRAZolam (XANAX) 0.5 MG tablet, Take 1/2 to 1 tablet 2 to 3 x/day as needed for Stress or Anxiety (Patient taking differently: Take 0.25-0.5 mg by mouth See admin instructions. Take 1/2 to 1 tablet 2 to 3 x/day as needed for Stress or Anxiety.) .  clobetasol ointment (TEMOVATE) 9.47 %, Apply 1 application topically 2 (two) times daily. .  ondansetron  (ZOFRAN ODT) 4 MG disintegrating tablet, Take 1 tablet (4 mg total) by mouth every 8 (eight) hours as needed for nausea or vomiting. .  sertraline (ZOLOFT) 50 MG tablet, Take 1 tablet daily for Mood (Patient not taking: Reported on 09/23/2018)  Allergies: Allergies  Allergen Reactions  . Crestor [Rosuvastatin Calcium] Other (See Comments)    myalgias  . Red Yeast Rice [Cholestin] Other (See Comments)    Dizzy  . Tape Other (See Comments)    Pt states that skin burns, and it hurts severely.  Tori Milks [Naproxen Sodium] Rash  . Darvon [Propoxyphene] Nausea And Vomiting and Anxiety  . Latex Rash    Contact makes the skin feel "burned"    Current Problems (verified) has History of ductal carcinoma in situ (DCIS) of breast; Hyperlipidemia; Prediabetes; Vitamin D deficiency; Medication management; Depression, controlled; Lumbago with Right Sciatica; Lumbar canal stenosis; Degenerative spondylolisthesis; Osteopenia; Overweight (BMI 25.0-29.9); and Atherosclerosis of aorta (HCC) on their problem list.  Screening Tests Immunization History  Administered Date(s) Administered  . Influenza, High Dose Seasonal PF 05/05/2014, 06/15/2015, 06/04/2016, 06/17/2017, 07/15/2018  . PPD Test 10/18/2013  . Pneumococcal Conjugate-13 05/05/2014  . Pneumococcal Polysaccharide-23 10/18/2014  . Tdap 10/18/2014  . Zoster 11/06/2009  Last colonoscopy: 2006 Cologuard: 2016 Last mammogram: Scheduled 02/2019  need reports - [facility] - records requested Last pap smear/pelvic exam: 2018 - OBGYN - never abnormal pap DEXA:2010, 2015, scheduled6/[facility] by [facility] - osteopenia without significant change   Prior vaccinations: TD or Tdap: 2016         Influenza: 2018            Pneumococcal: 2016 Prevnar13: 2015 Shingles/Zostavax: 2011 Shingrix: Due, discussed with patient, local pharm  Names of Other Physician/Practitioners you currently use: 1. [facility] here  for primary care 2. Eye Exam , [place] 3. Dentist Dr Mariea Clonts, Due for 2020 Patient Care Team: Unk Pinto, MD as PCP - General (Internal Medicine) Ladene Artist, MD as Consulting Physician (Gastroenterology) Rozetta Nunnery, MD as Consulting Physician (Otolaryngology) Susa Day, MD as Consulting Physician (Orthopedic Surgery) Nahser, Wonda Cheng, MD as Consulting Physician (Cardiology)  Surgical: She  has a past surgical history that includes Tubal ligation; Breast lumpectomy (09/2009); Lumbar disc surgery; Tonsillectomy and adenoidectomy (1960); Cesarean section; Laminectomy (Right, 07/18/2014); and Open reduction internal fixation (orif) distal radial fracture (Left, 09/23/2018). Family Her family history includes Alcohol abuse in her brother; COPD in her father; Heart attack (age of onset: 70) in her brother; Liver disease in her mother; Stroke (age of onset: 26) in her brother. Social history  She reports that she quit smoking about 13 years ago. She has a 10.00 pack-year smoking history. She has never used smokeless tobacco. She reports that she does not drink alcohol or use drugs.  MEDICARE WELLNESS OBJECTIVES: Physical activity: Current Exercise Habits: Structured exercise class, Exercise limited by: orthopedic condition(s) Cardiac risk factors:   Depression/mood screen:   Depression screen [facility] 2/9 01/05/2019  Decreased Interest 0  Down, Depressed, Hopeless 0  PHQ - 2 Score 0    ADLs:  In your present state of health, do you have any difficulty performing the following activities: 01/05/2019 09/23/2018  Hearing? N N  Vision? N N  Difficulty concentrating or making decisions? N N  Walking or climbing stairs? N N  Dressing or bathing? N Y  Doing errands, shopping? N -  Preparing Food and eating ? N -  Using the Toilet? N -  In the past six months, have you accidently leaked urine? N -  Do you have problems with loss of bowel control? N -  Managing your  Medications? N -  Managing your Finances? N -  Housekeeping or managing your Housekeeping? N -  Some recent data might be hidden     Cognitive Testing  Alert? Yes  Normal Appearance?Yes  Oriented to person? Yes  Place? Yes   Time? Yes  Recall of three objects?  Yes  Can perform simple calculations? Yes  Displays appropriate judgment?Yes  Can read the correct time from a watch face?Yes  EOL planning: Does Patient Have a Medical Advance Directive?: No Would patient like information on creating a medical advance directive?: No - Patient declined   Objective:   Today's Vitals   01/05/19 1119  BP: 134/75  Temp: (!) 97.3 F (36.3 C)  Weight: 143 lb (64.9 kg)  PainSc: 3   PainLoc: Arm   Body mass index is 26.16 kg/m.  General : Well sounding patient in no apparent distress HEENT: no hoarseness, no cough for duration of visit Lungs: speaks in complete sentences, no audible wheezing, no apparent distress Neurological: alert, oriented x 3 Psychiatric: pleasant, judgement appropriate  Medicare Attestation I have personally reviewed: The patient's medical and social history Their use of alcohol, tobacco or illicit drugs Their current medications and supplements The patient's functional ability including ADLs,fall risks, home safety risks, cognitive, and hearing and visual impairment Diet and physical activities Evidence for depression or mood disorders  The patient's weight, height, BMI, and visual acuity have been recorded in the chart.  I have made referrals, counseling, and provided education to the patient based on review of the above and I have provided the patient with a written personalized care plan for preventive services.     Garnet Sierras, NP [facility] 01/07/2019  11:25 AM

## 2019-01-07 ENCOUNTER — Encounter: Payer: Self-pay | Admitting: Adult Health Nurse Practitioner

## 2019-01-07 NOTE — Patient Instructions (Addendum)
Regina Shaw , Thank you for taking time to come for your Medicare Wellness Visit. I appreciate your ongoing commitment to your health goals. Please review the following plan we discussed and let me know if I can assist you in the future.  This is a list of the screening recommended for you and due dates:  Health Maintenance  Topic Date Due  . Mammogram  01/17/2019  . Flu Shot  04/03/2019  . Cologuard (Stool DNA test)  01/15/2021  . Tdap  10/18/2024  . DEXA scan (bone density measurement)  Completed  .  Hepatitis C: One time screening is recommended by Center for Disease Control  (CDC) for  adults born from [birth year] through 1967.   Completed  . Pneumonia vaccines  Completed    You had the Tdap vaccination in 2016.  You are covered for this for 10 years.  You are covered for pertussis, relating to your so to be arrival of grandchild.  You had the Zostavax vaccination in 2001.  You are due for Shingrix vaccination.  Below is information about this vaccination.  You may be get this at any local pharmacy, call for availability.    Ask insurance and pharmacy about shingrix - new vaccine   Can go to [email or web address] for more information  Shingrix Vaccination  Two vaccines are licensed and recommended to prevent shingles in the [place] Zoster vaccine live (ZVL, Zostavax) has been in use since 2006. Recombinant zoster vaccine (RZV, Shingrix), has been in use since 2017 and is recommended by AC[employer] as the preferred shingles vaccine.  What Everyone Should Know about Shingles Vaccine (Shingrix) One of the Recommended Vaccines by Disease Shingles vaccination is the only way to protect against shingles and postherpetic neuralgia (PHN), the most common complication from shingles. CDC recommends that healthy adults 50 years and older get two doses of the shingles vaccine called Shingrix (recombinant zoster vaccine), separated by 2 to 6 months, to  prevent shingles and the complications from the disease. Your doctor or pharmacist can give you Shingrix as a shot in your upper arm. Shingrix provides strong protection against shingles and PHN. Two doses of Shingrix is more than 90% effective at preventing shingles and PHN. Protection stays above 85% for at least the first four years after you get vaccinated. Shingrix is the preferred vaccine, over Zostavax (zoster vaccine live), a shingles vaccine in use since 2006. Zostavax may still be used to prevent shingles in healthy adults 60 years and older. For example, you could use Zostavax if a person is allergic to Shingrix, prefers Zostavax, or requests immediate vaccination and Shingrix is unavailable. Who Should Get Shingrix? Healthy adults 50 years and older should get two doses of Shingrix, separated by 2 to 6 months. You should get Shingrix even if in the past you . had shingles  . received Zostavax  . are not sure if you had chickenpox There is no maximum age for getting Shingrix. If you had shingles in the past, you can get Shingrix to help prevent future occurrences of the disease. There is no specific length of time that you need to wait after having shingles before you can receive Shingrix, but generally you should make sure the shingles rash has gone away before getting vaccinated. You can get Shingrix whether or not you remember having had chickenpox in the past. Studies show that more than 99% of Americans 40 years and older have had chickenpox, even if they don't remember having the disease. Chickenpox  and shingles are related because they are caused by the same virus (varicella zoster virus). After a person recovers from chickenpox, the virus stays dormant (inactive) in the body. It can reactivate years later and cause shingles. If you had Zostavax in the recent past, you should wait at least eight weeks before getting Shingrix. Talk to your healthcare provider to determine the best time to  get Shingrix. Shingrix is available in [facility] and pharmacies. To find doctor's offices or pharmacies near you that offer the vaccine, visit Health[facility] Vaccine FinderExternal. If you have questions about Shingrix, talk with your healthcare provider. Vaccine for Those 25 Years and Older  Shingrix reduces the risk of shingles and PHN by more than 90% in people 85 and older. CDC recommends the vaccine for healthy adults 50 and older.  Who Should Not Get Shingrix? You should not get Shingrix if you: . have ever had a severe allergic reaction to any component of the vaccine or after a dose of Shingrix  . tested negative for immunity to varicella zoster virus. If you test negative, you should get chickenpox vaccine.  . currently have shingles  . currently are pregnant or breastfeeding. Women who are pregnant or breastfeeding should wait to get Shingrix.  Marland Kitchen receive specific antiviral drugs (acyclovir, famciclovir, or valacyclovir) 24 hours before vaccination (avoid use of these antiviral drugs for 14 days after vaccination)- zoster vaccine live only If you have a minor acute (starts suddenly) illness, such as a cold, you may get Shingrix. But if you have a moderate or severe acute illness, you should usually wait until you recover before getting the vaccine. This includes anyone with a temperature of 101.82F or higher. The side effects of the Shingrix are temporary, and usually last 2 to 3 days. While you may experience pain for a few days after getting Shingrix, the pain will be less severe than having shingles and the complications from the disease. How Well Does Shingrix Work? Two doses of Shingrix provides strong protection against shingles and postherpetic neuralgia (PHN), the most common complication of shingles. . In adults 35 to 70 years old who got two doses, Shingrix was 97% effective in preventing shingles; among adults 70 years and older, Shingrix was 91% effective.  . In adults 6 to  70 years old who got two doses, Shingrix was 91% effective in preventing PHN; among adults 70 years and older, Shingrix was 89% effective. Shingrix protection remained high (more than 85%) in people 70 years and older throughout the four years following vaccination. Since your risk of shingles and PHN increases as you get older, it is important to have strong protection against shingles in your older years. Top of Page  What Are the Possible Side Effects of Shingrix? Studies show that Shingrix is safe. The vaccine helps your body create a strong defense against shingles. As a result, you are likely to have temporary side effects from getting the shots. The side effects may affect your ability to do normal daily activities for 2 to 3 days. Most people got a sore arm with mild or moderate pain after getting Shingrix, and some also had redness and swelling where they got the shot. Some people felt tired, had muscle pain, a headache, shivering, fever, stomach pain, or nausea. About 1 out of 6 people who got Shingrix experienced side effects that prevented them from doing regular activities. Symptoms went away on their own in about 2 to 3 days. Side effects were more common in  younger people. You might have a reaction to the first or second dose of Shingrix, or both doses. If you experience side effects, you may choose to take over-the-counter pain medicine such as ibuprofen or acetaminophen. If you experience side effects from Shingrix, you should report them to the Vaccine Adverse Event Reporting System (VAERS). Your doctor might file this report, or you can do it yourself through the VAERS websiteExternal, or by calling [phone]. If you have any questions about side effects from Shingrix, talk with your doctor. The shingles vaccine does not contain thimerosal (a preservative containing mercury). Top of Page  When Should I See a Doctor Because of the Side Effects I Experience From Shingrix? In  clinical trials, Shingrix was not associated with serious adverse events. In fact, serious side effects from vaccines are extremely rare. For example, for every 1 million doses of a vaccine given, only one or two people may have a severe allergic reaction. Signs of an allergic reaction happen within minutes or hours after vaccination and include hives, swelling of the face and throat, difficulty breathing, a fast heartbeat, dizziness, or weakness. If you experience these or any other life-threatening symptoms, see a doctor right away. Shingrix causes a strong response in your immune system, so it may produce short-term side effects more intense than you are used to from other vaccines. These side effects can be uncomfortable, but they are expected and usually go away on their own in 2 or 3 days. Top of Page  How Can I Pay For Shingrix? There are several ways shingles vaccine may be paid for: Medicare . Medicare Part D plans cover the shingles vaccine, but there may be a cost to you depending on your plan. There may be a copay for the vaccine, or you may need to pay in full then get reimbursed for a certain amount.  . Medicare Part B does not cover the shingles vaccine. Medicaid . Medicaid may or may not cover the vaccine. Contact your insurer to find out. Private health insurance . Many private health insurance plans will cover the vaccine, but there may be a cost to you depending on your plan. Contact your insurer to find out. Vaccine assistance programs . Some pharmaceutical companies provide vaccines to eligible adults who cannot afford them. You may want to check with the vaccine manufacturer, [employer], about Shingrix. If you do not currently have health insurance, learn more about affordable health coverage optionsExternal. To find doctor's offices or pharmacies near you that offer the vaccine, visit HealthMap Vaccine  FinderExternal.    >>>>>>>>>>>>>>>>>>>>>>>>>>>>>>>>>>>>>>>>>>>>>>>>>>>>>>> Coronavirus (COVID-19) Are you at risk?  Are you at risk for the Coronavirus (COVID-19)?  To be considered HIGH RISK for Coronavirus (COVID-19), you have to meet the following criteria:  . Traveled to [place], [place], [place], [place] or [place]; or in the [place] to [place], [place], [place]  . or [place]; and have fever, cough, and shortness of breath within the last 2 weeks of travel OR . Been in close contact with a person diagnosed with COVID-19 within the last 2 weeks and have  . fever, cough,and shortness of breath .  . IF YOU DO NOT MEET THESE CRITERIA, YOU ARE CONSIDERED LOW RISK FOR COVID-19.  What to do if you are HIGH RISK for COVID-19?  Marland Kitchen If you are having a medical emergency, call 911. . Seek medical care right away. Before you go to a doctor's office, urgent care or emergency department, .  call  ahead and tell them about your recent travel, contact with someone diagnosed with COVID-19  .  and your symptoms.  . You should receive instructions from your physician's office regarding next steps of care.  . When you arrive at healthcare provider, tell the healthcare staff immediately you have returned from  . visiting [place], [place], [place], [place] or [place]; or traveled in the [place] to [place], [place],  . [place] or [place] in the last two weeks or you have been in close contact with a person diagnosed with  . COVID-19 in the last 2 weeks.   . Tell the health care staff about your symptoms: fever, cough and shortness of breath. . After you have been seen by a medical provider, you will be either: o Tested for (COVID-19) and discharged home on quarantine except to seek medical care if  o symptoms worsen, and asked to  - Stay home and avoid contact with others until you get your results (4-5 days)  - Avoid travel on public transportation if possible (such as bus,  train, or airplane) or o Sent to the Emergency Department by EMS for evaluation, COVID-19 testing  and  o possible admission depending on your condition and test results.  What to do if you are LOW RISK for COVID-19?  Reduce your risk of any infection by using the same precautions used for avoiding the common cold or flu:  Marland Kitchen Wash your hands often with soap and warm water for at least 20 seconds.  If soap and water are not readily available,  . use an alcohol-based hand sanitizer with at least 60% alcohol.  . If coughing or sneezing, cover your mouth and nose by coughing or sneezing into the elbow areas of your shirt or coat, .  into a tissue or into your sleeve (not your hands). . Avoid shaking hands with others and consider head nods or verbal greetings only. . Avoid touching your eyes, nose, or mouth with unwashed hands.  . Avoid close contact with people who are sick. . Avoid places or events with large numbers of people in one location, like concerts or sporting events. . Carefully consider travel plans you have or are making. . If you are planning any travel outside or inside the [place], visit the CDC's Travelers' Health webpage for the latest health notices. . If you have some symptoms but not all symptoms, continue to monitor at home and seek medical attention  . if your symptoms worsen. . If you are having a medical emergency, call 911.   . >>>>>>>>>>>>>>>>>>>>>>>>>>>>>>>>> . We Do NOT Approve of  [facility], [employer]  Our Patients  To Do Home Visits & We Do NOT Approve of [employer] > > > > > > > > > > > > > > > > > > > > > > > > > > > > > > > > > > > > > > >  Preventive Care for Adults  A healthy lifestyle and preventive care can promote health and wellness. Preventive health guidelines for women include the following key practices.  A routine yearly physical is a good way to check with your health care provider about your health and preventive  screening. It is a chance to share any concerns and updates on your health and to receive a thorough exam.  Visit your dentist for a routine exam and preventive care every 6 months. Brush your teeth twice a day and floss  once a day. Good oral hygiene prevents tooth decay and gum disease.  The frequency of eye exams is based on your age, health, family medical history, use of contact lenses, and other factors. Follow your health care provider's recommendations for frequency of eye exams.  Eat a healthy diet. Foods like vegetables, fruits, whole grains, low-fat dairy products, and lean protein foods contain the nutrients you need without too many calories. Decrease your intake of foods high in solid fats, added sugars, and salt. Eat the right amount of calories for you. Get information about a proper diet from your health care provider, if necessary.  Regular physical exercise is one of the most important things you can do for your health. Most adults should get at least 150 minutes of moderate-intensity exercise (any activity that increases your heart rate and causes you to sweat) each week. In addition, most adults need muscle-strengthening exercises on 2 or more days a week.  Maintain a healthy weight. The body mass index (BMI) is a screening tool to identify possible weight problems. It provides an estimate of body fat based on height and weight. Your health care provider can find your BMI and can help you achieve or maintain a healthy weight. For adults 20 years and older:  A BMI below 18.5 is considered underweight.  A BMI of 18.5 to 24.9 is normal.  A BMI of 25 to 29.9 is considered overweight.  A BMI of 30 and above is considered obese.  Maintain normal blood lipids and cholesterol levels by exercising and minimizing your intake of saturated fat. Eat a balanced diet with plenty of fruit and vegetables. If your lipid or cholesterol levels are high, you are over 50, or you are at high risk  for heart disease, you may need your cholesterol levels checked more frequently. Ongoing high lipid and cholesterol levels should be treated with medicines if diet and exercise are not working.  If you smoke, find out from your health care provider how to quit. If you do not use tobacco, do not start.  Lung cancer screening is recommended for adults aged 108-80 years who are at high risk for developing lung cancer because of a history of smoking. A yearly low-dose CT scan of the lungs is recommended for people who have at least a 30-pack-year history of smoking and are a current smoker or have quit within the past 15 years. A pack year of smoking is smoking an average of 1 pack of cigarettes a day for 1 year (for example: 1 pack a day for 30 years or 2 packs a day for 15 years). Yearly screening should continue until the smoker has stopped smoking for at least 15 years. Yearly screening should be stopped for people who develop a health problem that would prevent them from having lung cancer treatment.  Avoid use of street drugs. Do not share needles with anyone. Ask for help if you need support or instructions about stopping the use of drugs.  High blood pressure causes heart disease and increases the risk of stroke.  Ongoing high blood pressure should be treated with medicines if weight loss and exercise do not work.  If you are 5-61 years old, ask your health care provider if you should take aspirin to prevent strokes.  Diabetes screening involves taking a blood sample to check your fasting blood sugar level. This should be done once every 3 years, after age 83, if you are within normal weight and without risk factors for  diabetes. Testing should be considered at a younger age or be carried out more frequently if you are overweight and have at least 1 risk factor for diabetes.  Breast cancer screening is essential preventive care for women. You should practice "breast self-awareness." This means  understanding the normal appearance and feel of your breasts and may include breast self-examination. Any changes detected, no matter how small, should be reported to a health care provider. Women in their 32s and 30s should have a clinical breast exam (CBE) by a health care provider as part of a regular health exam every 1 to 3 years. After age 28, women should have a CBE every year. Starting at age 67, women should consider having a mammogram (breast X-ray test) every year. Women who have a family history of breast cancer should talk to their health care provider about genetic screening. Women at a high risk of breast cancer should talk to their health care providers about having an MRI and a mammogram every year.  Breast cancer gene (BRCA)-related cancer risk assessment is recommended for women who have family members with BRCA-related cancers. BRCA-related cancers include breast, ovarian, tubal, and peritoneal cancers. Having family members with these cancers may be associated with an increased risk for harmful changes (mutations) in the breast cancer genes BRCA1 and BRCA2. Results of the assessment will determine the need for genetic counseling and BRCA1 and BRCA2 testing.  Routine pelvic exams to screen for cancer are no longer recommended for nonpregnant women who are considered low risk for cancer of the pelvic organs (ovaries, uterus, and vagina) and who do not have symptoms. Ask your health care provider if a screening pelvic exam is right for you.  If you have had past treatment for cervical cancer or a condition that could lead to cancer, you need Pap tests and screening for cancer for at least 20 years after your treatment. If Pap tests have been discontinued, your risk factors (such as having a new sexual partner) need to be reassessed to determine if screening should be resumed. Some women have medical problems that increase the chance of getting cervical cancer. In these cases, your health care  provider may recommend more frequent screening and Pap tests.    Colorectal cancer can be detected and often prevented. Most routine colorectal cancer screening begins at the age of 60 years and continues through age 78 years. However, your health care provider may recommend screening at an earlier age if you have risk factors for colon cancer. On a yearly basis, your health care provider may provide home test kits to check for hidden blood in the stool. Use of a small camera at the end of a tube, to directly examine the colon (sigmoidoscopy or colonoscopy), can detect the earliest forms of colorectal cancer. Talk to your health care provider about this at age 48, when routine screening begins.  Direct exam of the colon should be repeated every 5-10 years through age 42 years, unless early forms of pre-cancerous polyps or small growths are found.  Osteoporosis is a disease in which the bones lose minerals and strength with aging. This can result in serious bone fractures or breaks. The risk of osteoporosis can be identified using a bone density scan. Women ages 29 years and over and women at risk for fractures or osteoporosis should discuss screening with their health care providers. Ask your health care provider whether you should take a calcium supplement or vitamin D to reduce the rate of osteoporosis.  Menopause can be associated with physical symptoms and risks. Hormone replacement therapy is available to decrease symptoms and risks. You should talk to your health care provider about whether hormone replacement therapy is right for you.  Use sunscreen. Apply sunscreen liberally and repeatedly throughout the day. You should seek shade when your shadow is shorter than you. Protect yourself by wearing long sleeves, pants, a wide-brimmed hat, and sunglasses year round, whenever you are outdoors.  Once a month, do a whole body skin exam, using a mirror to look at the skin on your back. Tell your health  care provider of new moles, moles that have irregular borders, moles that are larger than a pencil eraser, or moles that have changed in shape or color.  Stay current with required vaccines (immunizations).  Influenza vaccine. All adults should be immunized every year.  Tetanus, diphtheria, and acellular pertussis (Td, Tdap) vaccine. Pregnant women should receive 1 dose of Tdap vaccine during each pregnancy. The dose should be obtained regardless of the length of time since the last dose. Immunization is preferred during the 27th-36th week of gestation. An adult who has not previously received Tdap or who does not know her vaccine status should receive 1 dose of Tdap. This initial dose should be followed by tetanus and diphtheria toxoids (Td) booster doses every 10 years. Adults with an unknown or incomplete history of completing a 3-dose immunization series with Td-containing vaccines should begin or complete a primary immunization series including a Tdap dose. Adults should receive a Td booster every 10 years.    Zoster vaccine. One dose is recommended for adults aged 62 years or older unless certain conditions are present.    Pneumococcal 13-valent conjugate (PCV13) vaccine. When indicated, a person who is uncertain of her immunization history and has no record of immunization should receive the PCV13 vaccine. An adult aged 22 years or older who has certain medical conditions and has not been previously immunized should receive 1 dose of PCV13 vaccine. This PCV13 should be followed with a dose of pneumococcal polysaccharide (PPSV23) vaccine. The PPSV23 vaccine dose should be obtained at least 1 or more year(s) after the dose of PCV13 vaccine. An adult aged 98 years or older who has certain medical conditions and previously received 1 or more doses of PPSV23 vaccine should receive 1 dose of PCV13. The PCV13 vaccine dose should be obtained 1 or more years after the last PPSV23 vaccine  dose.    Pneumococcal polysaccharide (PPSV23) vaccine. When PCV13 is also indicated, PCV13 should be obtained first. All adults aged 59 years and older should be immunized. An adult younger than age 58 years who has certain medical conditions should be immunized. Any person who resides in a nursing home or long-term care facility should be immunized. An adult smoker should be immunized. People with an immunocompromised condition and certain other conditions should receive both PCV13 and PPSV23 vaccines. People with human immunodeficiency virus (HIV) infection should be immunized as soon as possible after diagnosis. Immunization during chemotherapy or radiation therapy should be avoided. Routine use of PPSV23 vaccine is not recommended for American Indians, [place] Natives, or people younger than 65 years unless there are medical conditions that require PPSV23 vaccine. When indicated, people who have unknown immunization and have no record of immunization should receive PPSV23 vaccine. One-time revaccination 5 years after the first dose of PPSV23 is recommended for people aged 19-64 years who have chronic kidney failure, nephrotic syndrome, asplenia, or immunocompromised conditions. People who received  1-2 doses of PPSV23 before age 34 years should receive another dose of PPSV23 vaccine at age 17 years or later if at least 5 years have passed since the previous dose. Doses of PPSV23 are not needed for people immunized with PPSV23 at or after age 34 years.   Preventive Services / Frequency  Ages 39 years and over  Blood pressure check.  Lipid and cholesterol check.  Lung cancer screening. / Every year if you are aged 1-80 years and have a 30-pack-year history of smoking and currently smoke or have quit within the past 15 years. Yearly screening is stopped once you have quit smoking for at least 15 years or develop a health problem that would prevent you from having lung cancer treatment.  Clinical  breast exam.** / Every year after age 31 years.   BRCA-related cancer risk assessment.** / For women who have family members with a BRCA-related cancer (breast, ovarian, tubal, or peritoneal cancers).  Mammogram.** / Every year beginning at age 12 years and continuing for as long as you are in good health. Consult with your health care provider.  Pap test.** / Every 3 years starting at age 70 years through age 20 or 25 years with 3 consecutive normal Pap tests. Testing can be stopped between 65 and 70 years with 3 consecutive normal Pap tests and no abnormal Pap or HPV tests in the past 10 years.  Fecal occult blood test (FOBT) of stool. / Every year beginning at age 10 years and continuing until age 12 years. You may not need to do this test if you get a colonoscopy every 10 years.  Flexible sigmoidoscopy or colonoscopy.** / Every 5 years for a flexible sigmoidoscopy or every 10 years for a colonoscopy beginning at age 13 years and continuing until age 15 years.  Hepatitis C blood test.** / For all people born from [birth year] through 1967 and any individual with known risks for hepatitis C.  Osteoporosis screening.** / A one-time screening for women ages 49 years and over and women at risk for fractures or osteoporosis.  Skin self-exam. / Monthly.  Influenza vaccine. / Every year.  Tetanus, diphtheria, and acellular pertussis (Tdap/Td) vaccine.** / 1 dose of Td every 10 years.  Zoster vaccine.** / 1 dose for adults aged 13 years or older.  Pneumococcal 13-valent conjugate (PCV13) vaccine.** / Consult your health care provider.  Pneumococcal polysaccharide (PPSV23) vaccine.** / 1 dose for all adults aged 96 years and older. Screening for abdominal aortic aneurysm (AAA)  by ultrasound is recommended for people who have history of high blood pressure or who are current or former smokers. ++++++++++++++++++++ Recommend Adult Low Dose Aspirin or  coated  Aspirin 81 mg daily  To reduce risk of  Colon Cancer 20 %,  Skin Cancer 26 % ,  Melanoma 46%  and  Pancreatic cancer 60% ++++++++++++++++++++ Vitamin D goal  is between 70-100.  Please make sure that you are taking your Vitamin D as directed.  It is very important as a natural anti-inflammatory  helping hair, skin, and nails, as well as reducing stroke and heart attack risk.  It helps your bones and helps with mood. It also decreases numerous cancer risks so please take it as directed.  Low Vit D is associated with a 200-300% higher risk for CANCER  and 200-300% higher risk for HEART   ATTACK  &  STROKE.   .....................................Marland Kitchen It is also associated with higher death rate at younger ages,  autoimmune diseases  like Rheumatoid arthritis, Lupus, Multiple Sclerosis.    Also many other serious conditions, like depression, Alzheimer's Dementia, infertility, muscle aches, fatigue, fibromyalgia - just to name a few. ++++++++++++++++++ Recommend the book "The END of DIETING" by Dr Excell Seltzer  & the book "The END of DIABETES " by Dr Excell Seltzer At [email or web address] - get book & Audio CD's    Being diabetic has a  300% increased risk for heart attack, stroke, cancer, and alzheimer- type vascular dementia. It is very important that you work harder with diet by avoiding all foods that are white. Avoid white rice (brown & wild rice is OK), white potatoes (sweetpotatoes in moderation is OK), White bread or wheat bread or anything made out of white flour like bagels, donuts, rolls, buns, biscuits, cakes, pastries, cookies, pizza crust, and pasta (made from white flour & egg whites) - vegetarian pasta or spinach or wheat pasta is OK. Multigrain breads like Arnold's or Pepperidge [employer], or multigrain sandwich thins or flatbreads.  Diet, exercise and weight loss can reverse and cure diabetes in the early stages.  Diet, exercise and weight loss is very important in the control and prevention of complications of diabetes which affects every  system in your body, ie. Brain - dementia/stroke, eyes - glaucoma/blindness, heart - heart attack/heart failure, kidneys - dialysis, stomach - gastric paralysis, intestines - malabsorption, nerves - severe painful neuritis, circulation - gangrene & loss of a leg(s), and finally cancer and Alzheimers.    I recommend avoid fried & greasy foods,  sweets/candy, white rice (brown or wild rice or Quinoa is OK), white potatoes (sweet potatoes are OK) - anything made from white flour - bagels, doughnuts, rolls, buns, biscuits,white and wheat breads, pizza crust and traditional pasta made of white flour & egg white(vegetarian pasta or spinach or wheat pasta is OK).  Multi-grain bread is OK - like multi-grain flat bread or sandwich thins. Avoid alcohol in excess. Exercise is also important.    Eat all the vegetables you want - avoid meat, especially red meat and dairy - especially cheese.  Cheese is the most concentrated form of trans-fats which is the worst thing to clog up our arteries. Veggie cheese is OK which can be found in the fresh produce section at [employer] or [employer] or [employer]  +++++++++++++++++++ DASH Eating Plan  DASH stands for "Dietary Approaches to Stop Hypertension."   The DASH eating plan is a healthy eating plan that has been shown to reduce high blood pressure (hypertension). Additional health benefits may include reducing the risk of type 2 diabetes mellitus, heart disease, and stroke. The DASH eating plan may also help with weight loss. WHAT DO I NEED TO KNOW ABOUT THE DASH EATING PLAN? For the DASH eating plan, you will follow these general guidelines:  Choose foods with a percent daily value for sodium of less than 5% (as listed on the food label).  Use salt-free seasonings or herbs instead of table salt or sea salt.  Check with your health care provider or pharmacist before using salt substitutes.  Eat lower-sodium products, often labeled as "lower sodium" or "no salt  added."  Eat fresh foods.  Eat more vegetables, fruits, and low-fat dairy products.  Choose whole grains. Look for the word "whole" as the first word in the ingredient list.  Choose fish   Limit sweets, desserts, sugars, and sugary drinks.  Choose heart-healthy fats.  Eat veggie cheese   Eat more home-cooked food and less restaurant, buffet, and  fast food.  Limit fried foods.  Cook foods using methods other than frying.  Limit canned vegetables. If you do use them, rinse them well to decrease the sodium.  When eating at a restaurant, ask that your food be prepared with less salt, or no salt if possible.                      WHAT FOODS CAN I EAT? Read Dr Fara Olden Fuhrman's books on The End of Dieting & The End of Diabetes  Grains Whole grain or whole wheat bread. Brown rice. Whole grain or whole wheat pasta. Quinoa, bulgur, and whole grain cereals. Low-sodium cereals. Corn or whole wheat flour tortillas. Whole grain cornbread. Whole grain crackers. Low-sodium crackers.  Vegetables Fresh or frozen vegetables (raw, steamed, roasted, or grilled). Low-sodium or reduced-sodium tomato and vegetable juices. Low-sodium or reduced-sodium tomato sauce and paste. Low-sodium or reduced-sodium canned vegetables.   Fruits All fresh, canned (in natural juice), or frozen fruits.  Protein Products  All fish and seafood.  Dried beans, peas, or lentils. Unsalted nuts and seeds. Unsalted canned beans.  Dairy Low-fat dairy products, such as skim or 1% milk, 2% or reduced-fat cheeses, low-fat ricotta or cottage cheese, or plain low-fat yogurt. Low-sodium or reduced-sodium cheeses.  Fats and Oils Tub margarines without trans fats. Light or reduced-fat mayonnaise and salad dressings (reduced sodium). Avocado. Safflower, olive, or canola oils. Natural peanut or almond butter.  Other Unsalted popcorn and pretzels. The items listed above may not be a complete list of recommended foods or beverages.  Contact your dietitian for more options.  +++++++++++++++  WHAT FOODS ARE NOT RECOMMENDED? Grains/ White flour or wheat flour White bread. White pasta. White rice. Refined cornbread. Bagels and croissants. Crackers that contain trans fat.  Vegetables  Creamed or fried vegetables. Vegetables in a . Regular canned vegetables. Regular canned tomato sauce and paste. Regular tomato and vegetable juices.  Fruits Dried fruits. Canned fruit in light or heavy syrup. Fruit juice.  Meat and Other Protein Products Meat in general - RED meat & White meat.  Fatty cuts of meat. Ribs, chicken wings, all processed meats as bacon, sausage, bologna, salami, fatback, hot dogs, bratwurst and packaged luncheon meats.  Dairy Whole or 2% milk, cream, half-and-half, and cream cheese. Whole-fat or sweetened yogurt. Full-fat cheeses or blue cheese. Non-dairy creamers and whipped toppings. Processed cheese, cheese spreads, or cheese curds.  Condiments Onion and garlic salt, seasoned salt, table salt, and sea salt. Canned and packaged gravies. Worcestershire sauce. Tartar sauce. Barbecue sauce. Teriyaki sauce. Soy sauce, including reduced sodium. Steak sauce. Fish sauce. Oyster sauce. Cocktail sauce. Horseradish. Ketchup and mustard. Meat flavorings and tenderizers. Bouillon cubes. Hot sauce. Tabasco sauce. Marinades. Taco seasonings. Relishes.  Fats and Oils Butter, stick margarine, lard, shortening and bacon fat. Coconut, palm kernel, or palm oils. Regular salad dressings.  Pickles and olives. Salted popcorn and pretzels.  The items listed above may not be a complete list of foods and beverages to avoid.

## 2019-01-18 ENCOUNTER — Encounter: Payer: Self-pay | Admitting: Internal Medicine

## 2019-01-18 NOTE — Progress Notes (Signed)
        R  E  S  C  H  E  D U  L  E  D                                                                                                                                                                                                                       History of Present Illness:     This very nice 68 y.o. WWF presents for a Screening /Preventative Visit & comprehensive evaluation and management of multiple medical co-morbidities.  Patient has been followed for HTN, HLD, Prediabetes  and Vitamin D Deficiency.  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       HTN predates circa 2009. Patient's BP has been controlled at home and patient denies any cardiac symptoms as chest pain, palpitations, shortness of breath, dizziness or ankle swelling. Today's        Patient alleges severe intolerance to Statins & Zetia and her hyperlipidemia is not at goal  with diet. Lab Results  Component Value Date   CHOL 256 (H) 05/11/2018   HDL 71 05/11/2018   LDLCALC 160 (H) 05/11/2018   LDLDIRECT 139.2 04/29/2012   TRIG 129 05/11/2018   CHOLHDL 3.6 05/11/2018      Patient has hx/o prediabetes (A1c 6.3% / 2011 and 5.5% / 2016)  and patient denies reactive hypoglycemic symptoms, visual blurring, diabetic polys or paresthesias. Last A1c was not at goal: Lab Results  Component Value Date   HGBA1C 5.8 (H) 05/11/2018      Finally, patient has history of Vitamin D Deficiency  ("24" / 2008)   and last Vitamin D was at goal: Lab Results  Component Value Date   VD25OH 84 12/22/2017

## 2019-01-19 ENCOUNTER — Ambulatory Visit: Payer: Self-pay | Admitting: Internal Medicine

## 2019-01-26 ENCOUNTER — Encounter: Payer: Self-pay | Admitting: Internal Medicine

## 2019-01-26 DIAGNOSIS — R0989 Other specified symptoms and signs involving the circulatory and respiratory systems: Secondary | ICD-10-CM | POA: Insufficient documentation

## 2019-01-26 DIAGNOSIS — Z87891 Personal history of nicotine dependence: Secondary | ICD-10-CM | POA: Insufficient documentation

## 2019-01-26 DIAGNOSIS — R7309 Other abnormal glucose: Secondary | ICD-10-CM | POA: Insufficient documentation

## 2019-01-26 NOTE — Patient Instructions (Signed)
- Vit D  And Vit C 1,000 mg  are recommended to help protect  against the Covid_19 and other Corona viruses.   - Also it's recommended to take Zinc 50 mg to help  protect against the Covid_19  And best place to get  is also on [employer] and don't pay more than 6-8 cents /pill !   ================================ Coronavirus (COVID-19) Are you at risk?  Are you at risk for the Coronavirus (COVID-19)?  To be considered HIGH RISK for Coronavirus (COVID-19), you have to meet the following criteria:  . Traveled to [place], [place], [place], [place] or [place]; or in the [place] to [place], [place], [place]  . or [place]; and have fever, cough, and shortness of breath within the last 2 weeks of travel OR . Been in close contact with a person diagnosed with COVID-19 within the last 2 weeks and have  . fever, cough,and shortness of breath .  . IF YOU DO NOT MEET THESE CRITERIA, YOU ARE CONSIDERED LOW RISK FOR COVID-19.  What to do if you are HIGH RISK for COVID-19?  Marland Kitchen If you are having a medical emergency, call 911. . Seek medical care right away. Before you go to a doctor's office, urgent care or emergency department, .  call ahead and tell them about your recent travel, contact with someone diagnosed with COVID-19  .  and your symptoms.  . You should receive instructions from your physician's office regarding next steps of care.  . When you arrive at healthcare provider, tell the healthcare staff immediately you have returned from  . visiting [place], [place], [place], [place] or [place]; or traveled in the [place] to [place], [place],  . [place] or [place] in the last two weeks or you have been in close contact with a person diagnosed with  . COVID-19 in the last 2 weeks.   . Tell the health care staff about your symptoms: fever, cough and shortness of breath. . After you have been seen by a medical provider, you will be either: o Tested for (COVID-19) and  discharged home on quarantine except to seek medical care if  o symptoms worsen, and asked to  - Stay home and avoid contact with others until you get your results (4-5 days)  - Avoid travel on public transportation if possible (such as bus, train, or airplane) or o Sent to the Emergency Department by EMS for evaluation, COVID-19 testing  and  o possible admission depending on your condition and test results.  What to do if you are LOW RISK for COVID-19?  Reduce your risk of any infection by using the same precautions used for avoiding the common cold or flu:  Marland Kitchen Wash your hands often with soap and warm water for at least 20 seconds.  If soap and water are not readily available,  . use an alcohol-based hand sanitizer with at least 60% alcohol.  . If coughing or sneezing, cover your mouth and nose by coughing or sneezing into the elbow areas of your shirt or coat, .  into a tissue or into your sleeve (not your hands). . Avoid shaking hands with others and consider head nods or verbal greetings only. . Avoid touching your eyes, nose, or mouth with unwashed hands.  . Avoid close contact with people who are sick. . Avoid places or events with large numbers of people in one location, like concerts or sporting events. . Carefully consider travel plans  you have or are making. . If you are planning any travel outside or inside the [place], visit the CDC's Travelers' Health webpage for the latest health notices. . If you have some symptoms but not all symptoms, continue to monitor at home and seek medical attention  . if your symptoms worsen. . If you are having a medical emergency, call 911.   . >>>>>>>>>>>>>>>>>>>>>>>>>>>>>>>>> . We Do NOT Approve of  [facility], [employer]  Our Patients  To Do Home Visits & We Do NOT Approve of [employer] > > > > > > > > > > > > > > > > > > > > > > > > > > > > > > > > > > > > > > >  Preventive Care for Adults  A healthy lifestyle  and preventive care can promote health and wellness. Preventive health guidelines for women include the following key practices.  A routine yearly physical is a good way to check with your health care provider about your health and preventive screening. It is a chance to share any concerns and updates on your health and to receive a thorough exam.  Visit your dentist for a routine exam and preventive care every 6 months. Brush your teeth twice a day and floss once a day. Good oral hygiene prevents tooth decay and gum disease.  The frequency of eye exams is based on your age, health, family medical history, use of contact lenses, and other factors. Follow your health care provider's recommendations for frequency of eye exams.  Eat a healthy diet. Foods like vegetables, fruits, whole grains, low-fat dairy products, and lean protein foods contain the nutrients you need without too many calories. Decrease your intake of foods high in solid fats, added sugars, and salt. Eat the right amount of calories for you. Get information about a proper diet from your health care provider, if necessary.  Regular physical exercise is one of the most important things you can do for your health. Most adults should get at least 150 minutes of moderate-intensity exercise (any activity that increases your heart rate and causes you to sweat) each week. In addition, most adults need muscle-strengthening exercises on 2 or more days a week.  Maintain a healthy weight. The body mass index (BMI) is a screening tool to identify possible weight problems. It provides an estimate of body fat based on height and weight. Your health care provider can find your BMI and can help you achieve or maintain a healthy weight. For adults 20 years and older:  A BMI below 18.5 is considered underweight.  A BMI of 18.5 to 24.9 is normal.  A BMI of 25 to 29.9 is considered overweight.  A BMI of 30 and above is considered obese.  Maintain  normal blood lipids and cholesterol levels by exercising and minimizing your intake of saturated fat. Eat a balanced diet with plenty of fruit and vegetables. If your lipid or cholesterol levels are high, you are over 50, or you are at high risk for heart disease, you may need your cholesterol levels checked more frequently. Ongoing high lipid and cholesterol levels should be treated with medicines if diet and exercise are not working.  If you smoke, find out from your health care provider how to quit. If you do not use tobacco, do not start.  Lung cancer screening is recommended for adults aged 71-80 years who are at high risk for developing lung cancer because of a history  of smoking. A yearly low-dose CT scan of the lungs is recommended for people who have at least a 30-pack-year history of smoking and are a current smoker or have quit within the past 15 years. A pack year of smoking is smoking an average of 1 pack of cigarettes a day for 1 year (for example: 1 pack a day for 30 years or 2 packs a day for 15 years). Yearly screening should continue until the smoker has stopped smoking for at least 15 years. Yearly screening should be stopped for people who develop a health problem that would prevent them from having lung cancer treatment.  Avoid use of street drugs. Do not share needles with anyone. Ask for help if you need support or instructions about stopping the use of drugs.  High blood pressure causes heart disease and increases the risk of stroke.  Ongoing high blood pressure should be treated with medicines if weight loss and exercise do not work.  If you are 38-65 years old, ask your health care provider if you should take aspirin to prevent strokes.  Diabetes screening involves taking a blood sample to check your fasting blood sugar level. This should be done once every 3 years, after age 69, if you are within normal weight and without risk factors for diabetes. Testing should be considered  at a younger age or be carried out more frequently if you are overweight and have at least 1 risk factor for diabetes.  Breast cancer screening is essential preventive care for women. You should practice "breast self-awareness." This means understanding the normal appearance and feel of your breasts and may include breast self-examination. Any changes detected, no matter how small, should be reported to a health care provider. Women in their 62s and 30s should have a clinical breast exam (CBE) by a health care provider as part of a regular health exam every 1 to 3 years. After age 31, women should have a CBE every year. Starting at age 9, women should consider having a mammogram (breast X-ray test) every year. Women who have a family history of breast cancer should talk to their health care provider about genetic screening. Women at a high risk of breast cancer should talk to their health care providers about having an MRI and a mammogram every year.  Breast cancer gene (BRCA)-related cancer risk assessment is recommended for women who have family members with BRCA-related cancers. BRCA-related cancers include breast, ovarian, tubal, and peritoneal cancers. Having family members with these cancers may be associated with an increased risk for harmful changes (mutations) in the breast cancer genes BRCA1 and BRCA2. Results of the assessment will determine the need for genetic counseling and BRCA1 and BRCA2 testing.  Routine pelvic exams to screen for cancer are no longer recommended for nonpregnant women who are considered low risk for cancer of the pelvic organs (ovaries, uterus, and vagina) and who do not have symptoms. Ask your health care provider if a screening pelvic exam is right for you.  If you have had past treatment for cervical cancer or a condition that could lead to cancer, you need Pap tests and screening for cancer for at least 20 years after your treatment. If Pap tests have been discontinued,  your risk factors (such as having a new sexual partner) need to be reassessed to determine if screening should be resumed. Some women have medical problems that increase the chance of getting cervical cancer. In these cases, your health care provider may recommend more  frequent screening and Pap tests.    Colorectal cancer can be detected and often prevented. Most routine colorectal cancer screening begins at the age of 50 years and continues through age 75 years. However, your health care provider may recommend screening at an earlier age if you have risk factors for colon cancer. On a yearly basis, your health care provider may provide home test kits to check for hidden blood in the stool. Use of a small camera at the end of a tube, to directly examine the colon (sigmoidoscopy or colonoscopy), can detect the earliest forms of colorectal cancer. Talk to your health care provider about this at age 50, when routine screening begins.  Direct exam of the colon should be repeated every 5-10 years through age 75 years, unless early forms of pre-cancerous polyps or small growths are found.  Osteoporosis is a disease in which the bones lose minerals and strength with aging. This can result in serious bone fractures or breaks. The risk of osteoporosis can be identified using a bone density scan. Women ages 65 years and over and women at risk for fractures or osteoporosis should discuss screening with their health care providers. Ask your health care provider whether you should take a calcium supplement or vitamin D to reduce the rate of osteoporosis.  Menopause can be associated with physical symptoms and risks. Hormone replacement therapy is available to decrease symptoms and risks. You should talk to your health care provider about whether hormone replacement therapy is right for you.  Use sunscreen. Apply sunscreen liberally and repeatedly throughout the day. You should seek shade when your shadow is shorter  than you. Protect yourself by wearing long sleeves, pants, a wide-brimmed hat, and sunglasses year round, whenever you are outdoors.  Once a month, do a whole body skin exam, using a mirror to look at the skin on your back. Tell your health care provider of new moles, moles that have irregular borders, moles that are larger than a pencil eraser, or moles that have changed in shape or color.  Stay current with required vaccines (immunizations).  Influenza vaccine. All adults should be immunized every year.  Tetanus, diphtheria, and acellular pertussis (Td, Tdap) vaccine. Pregnant women should receive 1 dose of Tdap vaccine during each pregnancy. The dose should be obtained regardless of the length of time since the last dose. Immunization is preferred during the 27th-36th week of gestation. An adult who has not previously received Tdap or who does not know her vaccine status should receive 1 dose of Tdap. This initial dose should be followed by tetanus and diphtheria toxoids (Td) booster doses every 10 years. Adults with an unknown or incomplete history of completing a 3-dose immunization series with Td-containing vaccines should begin or complete a primary immunization series including a Tdap dose. Adults should receive a Td booster every 10 years.    Zoster vaccine. One dose is recommended for adults aged 60 years or older unless certain conditions are present.    Pneumococcal 13-valent conjugate (PCV13) vaccine. When indicated, a person who is uncertain of her immunization history and has no record of immunization should receive the PCV13 vaccine. An adult aged 19 years or older who has certain medical conditions and has not been previously immunized should receive 1 dose of PCV13 vaccine. This PCV13 should be followed with a dose of pneumococcal polysaccharide (PPSV23) vaccine. The PPSV23 vaccine dose should be obtained at least 1 or more year(s) after the dose of PCV13 vaccine. An adult   aged 24  years or older who has certain medical conditions and previously received 1 or more doses of PPSV23 vaccine should receive 1 dose of PCV13. The PCV13 vaccine dose should be obtained 1 or more years after the last PPSV23 vaccine dose.    Pneumococcal polysaccharide (PPSV23) vaccine. When PCV13 is also indicated, PCV13 should be obtained first. All adults aged 74 years and older should be immunized. An adult younger than age 85 years who has certain medical conditions should be immunized. Any person who resides in a nursing home or long-term care facility should be immunized. An adult smoker should be immunized. People with an immunocompromised condition and certain other conditions should receive both PCV13 and PPSV23 vaccines. People with human immunodeficiency virus (HIV) infection should be immunized as soon as possible after diagnosis. Immunization during chemotherapy or radiation therapy should be avoided. Routine use of PPSV23 vaccine is not recommended for American Indians, [place] Natives, or people younger than 65 years unless there are medical conditions that require PPSV23 vaccine. When indicated, people who have unknown immunization and have no record of immunization should receive PPSV23 vaccine. One-time revaccination 5 years after the first dose of PPSV23 is recommended for people aged 19-64 years who have chronic kidney failure, nephrotic syndrome, asplenia, or immunocompromised conditions. People who received 1-2 doses of PPSV23 before age 10 years should receive another dose of PPSV23 vaccine at age 31 years or later if at least 5 years have passed since the previous dose. Doses of PPSV23 are not needed for people immunized with PPSV23 at or after age 76 years.   Preventive Services / Frequency  Ages 30 years and over  Blood pressure check.  Lipid and cholesterol check.  Lung cancer screening. / Every year if you are aged 72-80 years and have a 30-pack-year history of smoking and  currently smoke or have quit within the past 15 years. Yearly screening is stopped once you have quit smoking for at least 15 years or develop a health problem that would prevent you from having lung cancer treatment.  Clinical breast exam.** / Every year after age 25 years.   BRCA-related cancer risk assessment.** / For women who have family members with a BRCA-related cancer (breast, ovarian, tubal, or peritoneal cancers).  Mammogram.** / Every year beginning at age 29 years and continuing for as long as you are in good health. Consult with your health care provider.  Pap test.** / Every 3 years starting at age 79 years through age 74 or 44 years with 3 consecutive normal Pap tests. Testing can be stopped between 65 and 70 years with 3 consecutive normal Pap tests and no abnormal Pap or HPV tests in the past 10 years.  Fecal occult blood test (FOBT) of stool. / Every year beginning at age 43 years and continuing until age 42 years. You may not need to do this test if you get a colonoscopy every 10 years.  Flexible sigmoidoscopy or colonoscopy.** / Every 5 years for a flexible sigmoidoscopy or every 10 years for a colonoscopy beginning at age 34 years and continuing until age 35 years.  Hepatitis C blood test.** / For all people born from [birth year] through 1967 and any individual with known risks for hepatitis C.  Osteoporosis screening.** / A one-time screening for women ages 58 years and over and women at risk for fractures or osteoporosis.  Skin self-exam. / Monthly.  Influenza vaccine. / Every year.  Tetanus, diphtheria, and acellular pertussis (Tdap/Td) vaccine.** /  1 dose of Td every 10 years.  Zoster vaccine.** / 1 dose for adults aged 60 years or older.  Pneumococcal 13-valent conjugate (PCV13) vaccine.** / Consult your health care provider.  Pneumococcal polysaccharide (PPSV23) vaccine.** / 1 dose for all adults aged 65 years and older. Screening for abdominal aortic aneurysm (AAA)   by ultrasound is recommended for people who have history of high blood pressure or who are current or former smokers. ++++++++++++++++++++ Recommend Adult Low Dose Aspirin or  coated  Aspirin 81 mg daily  To reduce risk of Colon Cancer 40 %,  Skin Cancer 26 % ,  Melanoma 46%  and  Pancreatic cancer 60% ++++++++++++++++++++ Vitamin D goal  is between 70-100.  Please make sure that you are taking your Vitamin D as directed.  It is very important as a natural anti-inflammatory  helping hair, skin, and nails, as well as reducing stroke and heart attack risk.  It helps your bones and helps with mood. It also decreases numerous cancer risks so please take it as directed.  Low Vit D is associated with a 200-300% higher risk for CANCER  and 200-300% higher risk for HEART   ATTACK  &  STROKE.   ...................................... It is also associated with higher death rate at younger ages,  autoimmune diseases like Rheumatoid arthritis, Lupus, Multiple Sclerosis.    Also many other serious conditions, like depression, Alzheimer's Dementia, infertility, muscle aches, fatigue, fibromyalgia - just to name a few. ++++++++++++++++++ Recommend the book "The END of DIETING" by Dr Joel Fuhrman  & the book "The END of DIABETES " by Dr Joel Fuhrman At [employer] - get book & Audio CD's    Being diabetic has a  300% increased risk for heart attack, stroke, cancer, and alzheimer- type vascular dementia. It is very important that you work harder with diet by avoiding all foods that are white. Avoid white rice (brown & wild rice is OK), white potatoes (sweetpotatoes in moderation is OK), White bread or wheat bread or anything made out of white flour like bagels, donuts, rolls, buns, biscuits, cakes, pastries, cookies, pizza crust, and pasta (made from white flour & egg whites) - vegetarian pasta or spinach or wheat pasta is OK. Multigrain breads like Arnold's or [employer], or multigrain sandwich thins  or flatbreads.  Diet, exercise and weight loss can reverse and cure diabetes in the early stages.  Diet, exercise and weight loss is very important in the control and prevention of complications of diabetes which affects every system in your body, ie. Brain - dementia/stroke, eyes - glaucoma/blindness, heart - heart attack/heart failure, kidneys - dialysis, stomach - gastric paralysis, intestines - malabsorption, nerves - severe painful neuritis, circulation - gangrene & loss of a leg(s), and finally cancer and Alzheimers.    I recommend avoid fried & greasy foods,  sweets/candy, white rice (brown or wild rice or Quinoa is OK), white potatoes (sweet potatoes are OK) - anything made from white flour - bagels, doughnuts, rolls, buns, biscuits,white and wheat breads, pizza crust and traditional pasta made of white flour & egg white(vegetarian pasta or spinach or wheat pasta is OK).  Multi-grain bread is OK - like multi-grain flat bread or sandwich thins. Avoid alcohol in excess. Exercise is also important.    Eat all the vegetables you want - avoid meat, especially red meat and dairy - especially cheese.  Cheese is the most concentrated form of trans-fats which is the worst thing to clog up our arteries. Veggie   cheese is OK which can be found in the fresh produce section at [employer] or [employer] or [employer]  +++++++++++++++++++ DASH Eating Plan  DASH stands for "Dietary Approaches to Stop Hypertension."   The DASH eating plan is a healthy eating plan that has been shown to reduce high blood pressure (hypertension). Additional health benefits may include reducing the risk of type 2 diabetes mellitus, heart disease, and stroke. The DASH eating plan may also help with weight loss. WHAT DO I NEED TO KNOW ABOUT THE DASH EATING PLAN? For the DASH eating plan, you will follow these general guidelines:  Choose foods with a percent daily value for sodium of less than 5% (as listed on the food  label).  Use salt-free seasonings or herbs instead of table salt or sea salt.  Check with your health care provider or pharmacist before using salt substitutes.  Eat lower-sodium products, often labeled as "lower sodium" or "no salt added."  Eat fresh foods.  Eat more vegetables, fruits, and low-fat dairy products.  Choose whole grains. Look for the word "whole" as the first word in the ingredient list.  Choose fish   Limit sweets, desserts, sugars, and sugary drinks.  Choose heart-healthy fats.  Eat veggie cheese   Eat more home-cooked food and less restaurant, buffet, and fast food.  Limit fried foods.  Cook foods using methods other than frying.  Limit canned vegetables. If you do use them, rinse them well to decrease the sodium.  When eating at a restaurant, ask that your food be prepared with less salt, or no salt if possible.                      WHAT FOODS CAN I EAT? Read Dr Fara Olden Fuhrman's books on The End of Dieting & The End of Diabetes  Grains Whole grain or whole wheat bread. Brown rice. Whole grain or whole wheat pasta. Quinoa, bulgur, and whole grain cereals. Low-sodium cereals. Corn or whole wheat flour tortillas. Whole grain cornbread. Whole grain crackers. Low-sodium crackers.  Vegetables Fresh or frozen vegetables (raw, steamed, roasted, or grilled). Low-sodium or reduced-sodium tomato and vegetable juices. Low-sodium or reduced-sodium tomato sauce and paste. Low-sodium or reduced-sodium canned vegetables.   Fruits All fresh, canned (in natural juice), or frozen fruits.  Protein Products  All fish and seafood.  Dried beans, peas, or lentils. Unsalted nuts and seeds. Unsalted canned beans.  Dairy Low-fat dairy products, such as skim or 1% milk, 2% or reduced-fat cheeses, low-fat ricotta or cottage cheese, or plain low-fat yogurt. Low-sodium or reduced-sodium cheeses.  Fats and Oils Tub margarines without trans fats. Light or reduced-fat mayonnaise  and salad dressings (reduced sodium). Avocado. Safflower, olive, or canola oils. Natural peanut or almond butter.  Other Unsalted popcorn and pretzels. The items listed above may not be a complete list of recommended foods or beverages. Contact your dietitian for more options.  +++++++++++++++  WHAT FOODS ARE NOT RECOMMENDED? Grains/ White flour or wheat flour White bread. White pasta. White rice. Refined cornbread. Bagels and croissants. Crackers that contain trans fat.  Vegetables  Creamed or fried vegetables. Vegetables in a . Regular canned vegetables. Regular canned tomato sauce and paste. Regular tomato and vegetable juices.  Fruits Dried fruits. Canned fruit in light or heavy syrup. Fruit juice.  Meat and Other Protein Products Meat in general - RED meat & White meat.  Fatty cuts of meat. Ribs, chicken wings, all processed meats as bacon, sausage, bologna, salami,  fatback, hot dogs, bratwurst and packaged luncheon meats.  Dairy Whole or 2% milk, cream, half-and-half, and cream cheese. Whole-fat or sweetened yogurt. Full-fat cheeses or blue cheese. Non-dairy creamers and whipped toppings. Processed cheese, cheese spreads, or cheese curds.  Condiments Onion and garlic salt, seasoned salt, table salt, and sea salt. Canned and packaged gravies. Worcestershire sauce. Tartar sauce. Barbecue sauce. Teriyaki sauce. Soy sauce, including reduced sodium. Steak sauce. Fish sauce. Oyster sauce. Cocktail sauce. Horseradish. Ketchup and mustard. Meat flavorings and tenderizers. Bouillon cubes. Hot sauce. Tabasco sauce. Marinades. Taco seasonings. Relishes.  Fats and Oils Butter, stick margarine, lard, shortening and bacon fat. Coconut, palm kernel, or palm oils. Regular salad dressings.  Pickles and olives. Salted popcorn and pretzels.  The items listed above may not be a complete list of foods and beverages to avoid.

## 2019-01-26 NOTE — Progress Notes (Signed)
[facility] Unk Pinto, M.D.     Uvaldo Bristle. Silverio Lay, P.A.-C Liane Comber, [place] [address] [place], [place] [address] Telephone [phone] Telefax [phone] Annual Screening/Preventative Visit & Comprehensive Evaluation &  Examination  History of Present Illness:     This very nice 68 y.o. WWF  presents for a Screening /Preventative Visit & comprehensive evaluation and management of multiple medical co-morbidities.  Patient has been followed for HTN, HLD, Prediabetes  and Vitamin D Deficiency. Patient also has hx/o CIS of theLt Breast (2011) tx'd by lumpectomy and subsequentRadiation. Hx/o Osteopenia by previous dexaBMD (2018).      Labile HTN predates since 2009. Patient's BP has been controlled at home and patient denies any cardiac symptoms as chest pain, palpitations, shortness of breath, dizziness or ankle swelling. Today's BP was initially slightly elevated and rechecked at goal - 134/70.     Patient is Statin & Zetia Intolerant and  Her hyperlipidemia is not controlled with diet.  Last lipids were not at goalL Lab Results  Component Value Date   CHOL 239 (H) 01/28/2019   HDL 72 01/28/2019   LDLCALC 147 (H) 01/28/2019   LDLDIRECT 139.2 04/29/2012   TRIG 93 01/28/2019   CHOLHDL 3.3 01/28/2019      Patient has hx/o prediabetes (A1c 6.3% / 2008 and 5.9% / 2016)  and patient denies reactive hypoglycemic symptoms, visual blurring, diabetic polys or paresthesias. Last A1c was not at goal: Lab Results  Component Value Date   HGBA1C 5.6 01/28/2019      Finally, patient has history of Vitamin D Deficiency ("24" / 2008)  and last Vitamin D was at goal: Lab Results  Component Value Date   VD25OH 66 01/28/2019   Current Outpatient Medications on File Prior to Visit  Medication Sig  . ALPRAZolam (XANAX) 0.5 MG tablet Take 1/2 to 1 tablet 2 to 3 x/day as needed for Stress or Anxiety  (Patient taking differently: Take 0.25-0.5 mg by mouth See admin instructions. Take 1/2 to 1 tablet 2 to 3 x/day as needed for Stress or Anxiety.)  . cholecalciferol (VITAMIN D) 1000 UNITS tablet Take 5,000 Units by mouth daily.   Marland Kitchen ibuprofen (ADVIL,MOTRIN) 200 MG tablet Take 400 mg by mouth daily as needed for moderate pain.   . Multiple Vitamins-Minerals (ICAPS LUTEIN & OMEGA-3 PO) Take 1 capsule by mouth daily.   No current facility-administered medications on file prior to visit.    Allergies  Allergen Reactions  . Crestor [Rosuvastatin Calcium] Other (See Comments)    myalgias  . Red Yeast Rice [Cholestin] Other (See Comments)    Dizzy  . Tape Other (See Comments)    Pt states that skin burns, and it hurts severely.  Tori Milks [Naproxen Sodium] Rash  . Darvon [Propoxyphene] Nausea And Vomiting and Anxiety  . Latex Rash    Contact makes the skin feel "burned"   Past Medical History:  Diagnosis Date  . Anxiety   . DCIS (ductal carcinoma in situ) of breast 04/29/2012  . HTN (hypertension)   . Hyperlipidemia   . Pre-diabetes   . Vitamin D deficiency    Health Maintenance  Topic Date Due  . MAMMOGRAM  01/17/2019  . INFLUENZA VACCINE  04/03/2019  . Fecal DNA (Cologuard)  01/15/2021  . TETANUS/TDAP  10/18/2024  . DEXA SCAN  Completed  . Hepatitis C Screening  Completed  . PNA vac Low Risk Adult  Completed   Immunization History  Administered Date(s) Administered  . Influenza, High Dose Seasonal PF 05/05/2014, 06/15/2015, 06/04/2016, 06/17/2017, 07/15/2018  . PPD Test 10/18/2013  . Pneumococcal Conjugate-13 05/05/2014  . Pneumococcal Polysaccharide-23 10/18/2014  . Tdap 10/18/2014  . Zoster 11/06/2009   Last Colon - 06/23/2005 Dr Fuller Plan - 55 yr f/u recc - due 2016 - patient declined   Cologard - 01/15/2018 - Negative - recc 3 yr f/u due June 2022  Last MGM - 02/06/2019  Past Surgical History:  Procedure Laterality Date  . BREAST LUMPECTOMY  09/2009   [place]    . LAMINECTOMY Right 07/18/2014   Procedure: Right Lumbar Four to Five Laminectomy for Synovial cyst;  Surgeon: Kristeen Miss, MD;  Location: [facility];  Service: Neurosurgery;  Laterality: Right;  Right L4-5 Laminectomy for Synovial cyst  . LUMBAR [facility]  . OPEN REDUCTION INTERNAL FIXATION (ORIF) DISTAL RADIAL FRACTURE Left 09/23/2018   Procedure: OPEN REDUCTION INTERNAL FIXATION (ORIF) DISTAL RADIAL FRACTURE;  Surgeon: Iran Planas, MD;  Location: [facility];  Service: Orthopedics;  Laterality: Left;  . TONSILLECTOMY AND ADENOIDECTOMY  1960  . TUBAL LIGATION     Family History  Problem Relation Age of Onset  . Liver disease Mother        d/c at 74, acute liver failure  . COPD Father        d/c at 59  . Heart attack Brother 51  . Stroke Brother 55       same brother  . Alcohol abuse Brother        other brother   Social History   Tobacco Use  . Smoking status: Former Smoker    Packs/day: 1.00    Years: 10.00    Pack years: 10.00    Last attempt to quit: 09/02/2005    Years since quitting: 13.4  . Smokeless tobacco: Never Used  Substance Use Topics  . Alcohol use: No  . Drug use: No    ROS Constitutional: Denies fever, chills, weight loss/gain, headaches, insomnia,  night sweats, and change in appetite. Does c/o fatigue. Eyes: Denies redness, blurred vision, diplopia, discharge, itchy, watery eyes.  ENT: Denies discharge, congestion, post nasal drip, epistaxis, sore throat, earache, hearing loss, dental pain, Tinnitus, Vertigo, Sinus pain, snoring.  Cardio: Denies chest pain, palpitations, irregular heartbeat, syncope, dyspnea, diaphoresis, orthopnea, PND, claudication, edema Respiratory: denies cough, dyspnea, DOE, pleurisy, hoarseness, laryngitis, wheezing.  Gastrointestinal: Denies dysphagia, heartburn, reflux, water brash, pain, cramps, nausea, vomiting, bloating, diarrhea, constipation, hematemesis, melena, hematochezia,  jaundice, hemorrhoids Genitourinary: Denies dysuria, frequency, urgency, nocturia, hesitancy, discharge, hematuria, flank pain Breast: Breast lumps, nipple discharge, bleeding.  Musculoskeletal: Denies arthralgia, myalgia, stiffness, Jt. Swelling, pain, limp, and strain/sprain. Denies falls. Skin: Denies puritis, rash, hives, warts, acne, eczema, changing in skin lesion Neuro: No weakness, tremor, incoordination, spasms, paresthesia, pain Psychiatric: Denies confusion, memory loss, sensory loss. Denies Depression. Endocrine: Denies change in weight, skin, hair change, nocturia, and paresthesia, diabetic polys, visual blurring, hyper / hypo glycemic episodes.  Heme/Lymph: No excessive bleeding, bruising, enlarged lymph nodes.  Physical Exam  BP 134/70   Pulse 64   Temp 97.9 F (36.6 C)   Resp 16   Ht 5\' 2"  (1.575 m)   Wt 149 lb 6.4 oz (67.8 kg)   BMI 27.33 kg/m   General Appearance: Well nourished, well groomed and in no apparent distress.  Eyes: PERRLA, EOMs, conjunctiva no swelling or erythema, normal fundi and vessels. Sinuses:  No frontal/maxillary tenderness ENT/Mouth: EACs patent / TMs  nl. Nares clear without erythema, swelling, mucoid exudates. Oral hygiene is good. No erythema, swelling, or exudate. Tongue normal, non-obstructing. Tonsils not swollen or erythematous. Hearing normal.  Neck: Supple, thyroid not palpable. No bruits, nodes or JVD. Respiratory: Respiratory effort normal.  BS equal and clear bilateral without rales, rhonci, wheezing or stridor. Cardio: Heart sounds are normal with regular rate and rhythm and no murmurs, rubs or gallops. Peripheral pulses are normal and equal bilaterally without edema. No aortic or femoral bruits. Chest: symmetric with normal excursions and percussion. Breasts: Symmetric, without lumps, nipple discharge, retractions, or fibrocystic changes.  Abdomen: Flat, soft with bowel sounds active. Nontender, no guarding, rebound, hernias,  masses, or organomegaly.  Lymphatics: Non tender without lymphadenopathy.  Musculoskeletal: Full ROM all peripheral extremities, joint stability, 5/5 strength, and normal gait. Skin: Warm and dry without rashes, lesions, cyanosis, clubbing or  ecchymosis.  Neuro: Cranial nerves intact, reflexes equal bilaterally. Normal muscle tone, no cerebellar symptoms. Sensation intact.  Pysch: Alert and oriented X 3, normal affect, Insight and Judgment appropriate.   Assessment and Plan  1. Annual Preventative Screening Examination  2. Labile hypertension  - EKG 12-Lead - Urinalysis, Routine w reflex microscopic - Microalbumin / creatinine urine ratio - CBC with Differential/Platelet - COMPLETE METABOLIC PANEL WITH GFR - Magnesium - TSH  3. Hyperlipidemia, mixed  - EKG 12-Lead - Lipid panel - TSH  4. Abnormal glucose  - EKG 12-Lead - Hemoglobin A1c - Insulin, random  5. Vitamin D deficiency  - VITAMIN D 25 Hydroxyl  6. Prediabetes  - Hemoglobin A1c - Insulin, random  7. Screening for colorectal cancer  - POC Hemoccult Bld/Stl   8. Screening for ischemic heart disease  - EKG 12-Lead  9. FHx: heart disease  - EKG 12-Lead  10. Former smoker  - EKG 12-Lead  11. Aortic atherosclerosis (HCC)  - EKG 12-Lead  12. Medication management  - Urinalysis, Routine w reflex microscopic - Microalbumin / creatinine urine ratio - CBC with Differential/Platelet - COMPLETE METABOLIC PANEL WITH GFR - Magnesium - Lipid panel - TSH - Hemoglobin A1c - Insulin, random - VITAMIN D 25 Hydroxyl       Patient was counseled in prudent diet to achieve/maintain BMI less than 25 for weight control, BP monitoring, regular exercise and medications. Discussed med's effects and SE's. Screening labs and tests as requested with regular follow-up as recommended. I discussed the assessment and treatment plan as above with the patient. The patient was provided an opportunity to ask questions and  all were answered. The patient agreed with the plan and demonstrated an understanding of the instructions. Over 40 minutes of exam, counseling, chart review and high complex critical decision making was performed.   Kirtland Bouchard, MD

## 2019-01-28 ENCOUNTER — Ambulatory Visit (INDEPENDENT_AMBULATORY_CARE_PROVIDER_SITE_OTHER): Payer: Medicare Other | Admitting: Internal Medicine

## 2019-01-28 ENCOUNTER — Other Ambulatory Visit: Payer: Self-pay

## 2019-01-28 VITALS — BP 134/70 | HR 64 | Temp 97.9°F | Resp 16 | Ht 62.0 in | Wt 149.4 lb

## 2019-01-28 DIAGNOSIS — Z8249 Family history of ischemic heart disease and other diseases of the circulatory system: Secondary | ICD-10-CM | POA: Diagnosis not present

## 2019-01-28 DIAGNOSIS — R0989 Other specified symptoms and signs involving the circulatory and respiratory systems: Secondary | ICD-10-CM

## 2019-01-28 DIAGNOSIS — Z87891 Personal history of nicotine dependence: Secondary | ICD-10-CM | POA: Diagnosis not present

## 2019-01-28 DIAGNOSIS — Z1211 Encounter for screening for malignant neoplasm of colon: Secondary | ICD-10-CM

## 2019-01-28 DIAGNOSIS — Z0001 Encounter for general adult medical examination with abnormal findings: Secondary | ICD-10-CM

## 2019-01-28 DIAGNOSIS — R7309 Other abnormal glucose: Secondary | ICD-10-CM

## 2019-01-28 DIAGNOSIS — E782 Mixed hyperlipidemia: Secondary | ICD-10-CM

## 2019-01-28 DIAGNOSIS — R7303 Prediabetes: Secondary | ICD-10-CM

## 2019-01-28 DIAGNOSIS — Z136 Encounter for screening for cardiovascular disorders: Secondary | ICD-10-CM

## 2019-01-28 DIAGNOSIS — I700 Atherosclerosis of aorta: Secondary | ICD-10-CM

## 2019-01-28 DIAGNOSIS — E559 Vitamin D deficiency, unspecified: Secondary | ICD-10-CM

## 2019-01-28 DIAGNOSIS — Z0000 Encounter for general adult medical examination without abnormal findings: Secondary | ICD-10-CM

## 2019-01-28 DIAGNOSIS — Z79899 Other long term (current) drug therapy: Secondary | ICD-10-CM

## 2019-01-29 ENCOUNTER — Encounter: Payer: Self-pay | Admitting: Internal Medicine

## 2019-01-29 ENCOUNTER — Telehealth: Payer: Self-pay | Admitting: Nurse Practitioner

## 2019-01-29 LAB — LIPID PANEL
Cholesterol: 239 mg/dL — ABNORMAL HIGH (ref <200)
HDL: 72 mg/dL (ref >=50)
LDL Cholesterol (Calc): 147 mg/dL (calc) — ABNORMAL HIGH
Non-HDL Cholesterol (Calc): 167 mg/dL (calc) — ABNORMAL HIGH (ref <130)
Total CHOL/HDL Ratio: 3.3 (calc) (ref <5.0)
Triglycerides: 93 mg/dL (ref <150)

## 2019-01-29 LAB — COMPLETE METABOLIC PANEL WITH GFR
AG Ratio: 1.7 (calc) (ref 1.0–2.5)
ALT: 9 U/L (ref 6–29)
AST: 15 U/L (ref 10–35)
Albumin: 4.1 g/dL (ref 3.6–5.1)
Alkaline phosphatase (APISO): 74 U/L (ref 37–153)
BUN: 12 mg/dL (ref 7–25)
CO2: 23 mmol/L (ref 20–32)
Calcium: 9.6 mg/dL (ref 8.6–10.4)
Chloride: 105 mmol/L (ref 98–110)
Creat: 0.74 mg/dL (ref 0.50–0.99)
GFR, Est African American: 96 mL/min/{1.73_m2} (ref >=60)
GFR, Est Non African American: 83 mL/min/{1.73_m2} (ref >=60)
Globulin: 2.4 g/dL (calc) (ref 1.9–3.7)
Glucose, Bld: 85 mg/dL (ref 65–99)
Potassium: 4.2 mmol/L (ref 3.5–5.3)
Sodium: 140 mmol/L (ref 135–146)
Total Bilirubin: 0.5 mg/dL (ref 0.2–1.2)
Total Protein: 6.5 g/dL (ref 6.1–8.1)

## 2019-01-29 LAB — CBC WITH DIFFERENTIAL/PLATELET
Absolute Monocytes: 378 cells/uL (ref 200–950)
Basophils Absolute: 32 cells/uL (ref 0–200)
Basophils Relative: 0.6 %
Eosinophils Absolute: 92 cells/uL (ref 15–500)
Eosinophils Relative: 1.7 %
HCT: 42.7 % (ref 35.0–45.0)
Hemoglobin: 13.9 g/dL (ref 11.7–15.5)
Lymphs Abs: 2549 cells/uL (ref 850–3900)
MCH: 28.5 pg (ref 27.0–33.0)
MCHC: 32.6 g/dL (ref 32.0–36.0)
MCV: 87.5 fL (ref 80.0–100.0)
MPV: 10.9 fL (ref 7.5–12.5)
Monocytes Relative: 7 %
Neutro Abs: 2349 cells/uL (ref 1500–7800)
Neutrophils Relative %: 43.5 %
Platelets: 176 10*3/uL (ref 140–400)
RBC: 4.88 10*6/uL (ref 3.80–5.10)
RDW: 12.5 % (ref 11.0–15.0)
Total Lymphocyte: 47.2 %
WBC: 5.4 10*3/uL (ref 3.8–10.8)

## 2019-01-29 LAB — MAGNESIUM: Magnesium: 1.9 mg/dL (ref 1.5–2.5)

## 2019-01-29 LAB — URINALYSIS, ROUTINE W REFLEX MICROSCOPIC
Bacteria, UA: NONE SEEN /HPF
Bilirubin Urine: NEGATIVE
Glucose, UA: NEGATIVE
Hgb urine dipstick: NEGATIVE
Hyaline Cast: NONE SEEN /LPF
Ketones, ur: NEGATIVE
Nitrite: NEGATIVE
Protein, ur: NEGATIVE
RBC / HPF: NONE SEEN /HPF (ref 0–2)
Specific Gravity, Urine: 1.012 (ref 1.001–1.03)
Squamous Epithelial / HPF: NONE SEEN /HPF (ref <=5)
WBC, UA: NONE SEEN /HPF (ref 0–5)
pH: 6.5 (ref 5.0–8.0)

## 2019-01-29 LAB — TSH: TSH: 0.44 mIU/L (ref 0.40–4.50)

## 2019-01-29 LAB — MICROALBUMIN / CREATININE URINE RATIO
Creatinine, Urine: 55 mg/dL (ref 20–275)
Microalb Creat Ratio: 4 mcg/mg creat (ref <30)
Microalb, Ur: 0.2 mg/dL

## 2019-01-29 LAB — INSULIN, RANDOM: Insulin: 5.8 u[IU]/mL

## 2019-01-29 LAB — HEMOGLOBIN A1C
Hgb A1c MFr Bld: 5.6 % of total Hgb (ref <5.7)
Mean Plasma Glucose: 114 (calc)
eAG (mmol/L): 6.3 (calc)

## 2019-01-29 LAB — VITAMIN D 25 HYDROXY (VIT D DEFICIENCY, FRACTURES): Vit D, 25-Hydroxy: 66 ng/mL (ref 30–100)

## 2019-01-29 NOTE — Telephone Encounter (Signed)
Reviewed lipid results with patient. She states she talked with a pharmacist in the past about the PCSK-9 inhibitors and there was something about them that caused her not to want to start them but she cannot remember what it was. I explained the significance of an elevated LDL and offered her the option of scheduling an appointment with Dr. Acie Fredrickson or a pharmacist. I also offered that she may want to talk to Dr. Melford Aase when he calls to review lab results. I advised her to call back to schedule an appointment with our office in the future if she would like to follow-up with [place]. She verbalized understanding and agreement and thanked me for the call.

## 2019-01-29 NOTE — Telephone Encounter (Signed)
-----   Message from Thayer Headings, MD sent at 01/29/2019 12:01 PM EDT ----- Her LDL is back up.  It apears that she did not tolerate the rosuvastatin Will have her return to see our lipid clinic to look for other options ( ? PCSK-9 inhibitors )

## 2019-02-08 LAB — HM MAMMOGRAPHY: HM Mammogram: NORMAL (ref 0–4)

## 2019-03-25 ENCOUNTER — Other Ambulatory Visit: Payer: Self-pay | Admitting: Internal Medicine

## 2019-03-26 ENCOUNTER — Other Ambulatory Visit: Payer: Self-pay | Admitting: Internal Medicine

## 2019-03-26 MED ORDER — TRIAMCINOLONE ACETONIDE 0.5 % EX OINT: 1.0000 "application " | TOPICAL_OINTMENT | Freq: Two times a day (BID) | CUTANEOUS | 3 refills | Status: DC

## 2019-05-18 ENCOUNTER — Telehealth: Payer: Self-pay | Admitting: Internal Medicine

## 2019-05-18 NOTE — Telephone Encounter (Signed)
Patient called to advise last screening mammogram was at [facility] 02/08/2019. Please update chart.

## 2019-05-26 ENCOUNTER — Encounter: Payer: Self-pay | Admitting: *Deleted

## 2019-06-02 ENCOUNTER — Other Ambulatory Visit: Payer: Self-pay | Admitting: Internal Medicine

## 2019-06-02 DIAGNOSIS — F419 Anxiety disorder, unspecified: Secondary | ICD-10-CM

## 2019-06-02 MED ORDER — SERTRALINE HCL 50 MG PO TABS: ORAL_TABLET | ORAL | 1 refills | Status: DC

## 2019-06-02 MED ORDER — BUSPIRONE HCL 5 MG PO TABS: ORAL_TABLET | ORAL | 1 refills | Status: DC

## 2019-06-08 ENCOUNTER — Other Ambulatory Visit: Payer: Self-pay

## 2019-06-08 ENCOUNTER — Ambulatory Visit (INDEPENDENT_AMBULATORY_CARE_PROVIDER_SITE_OTHER): Payer: Medicare Other

## 2019-06-08 VITALS — Temp 97.5°F

## 2019-06-08 DIAGNOSIS — Z23 Encounter for immunization: Secondary | ICD-10-CM

## 2019-06-08 NOTE — Progress Notes (Signed)
Patient presents to the office forHDFlu Vaccine. Vaccine administered toLEFTDeltoid withoutanycomplication. Temperature taken and recorded 

## 2019-06-09 ENCOUNTER — Ambulatory Visit: Payer: Medicare Other

## 2019-06-25 ENCOUNTER — Other Ambulatory Visit: Payer: Self-pay | Admitting: Internal Medicine

## 2019-06-25 DIAGNOSIS — F419 Anxiety disorder, unspecified: Secondary | ICD-10-CM

## 2019-07-28 NOTE — Progress Notes (Deleted)
FOLLOW UP  Assessment and Plan:   Atherosclerosis of aorta Per imaging; 10/2017 Control blood pressure, cholesterol, glucose, increase exercise.   Cholesterol Currently above goal; patient alleges severe statin/zetia intolerance; declined resins, PCSK9i, recommended she add soluble fiber supplement Continue low cholesterol diet and exercise.  Check lipid panel.   Hx of prediabetes Recent A1Cs at goal Discussed diet/exercise, weight management  Defer A1C; check CMP  Overweight Long discussion about weight loss, diet, and exercise Recommended diet heavy in fruits and veggies and low in animal meats, cheeses, and dairy products, appropriate calorie intake Discussed ideal weight for height and initial weight goal (145lb) Will follow up in 3 months  Vitamin D Def At goal at last visit; continue supplementation to maintain goal of 60-100 Defer Vit D level  Recurrent major depression/anxiety, in partial *** remission Continue medications, *** Lifestyle discussed: diet/exerise, sleep hygiene, stress management, hydration   Continue diet and meds as discussed. Further disposition pending results of labs. Discussed med's effects and SE's.   Over 30 minutes of exam, counseling, chart review, and critical decision making was performed.   Future Appointments  Date Time Provider [place]  08/02/2019 10:45 AM Liane Comber, NP [facility] None  01/28/2020 10:30 AM Garnet Sierras, NP [facility] None  02/23/2020 10:00 AM Unk Pinto, MD [facility] None  03/08/2020 10:00 AM Unk Pinto, MD [facility] None    ----------------------------------------------------------------------------------------------------------------------  HPI 68 y.o. female  presents for 3 month follow up on hypertension, cholesterol, prediabetes, weight and vitamin D deficiency.   She has ongoing depression treated by zoloft 50 mg daily and buspar TID PRN; she reported under a lot of stress r/t  her daughter's unexpected separation. She was previously on xanax ***  She follows with ortho for back; she had wrist fracture earlier this year in 09/2018 and underwent ORIF; continues to follow with occupational therapy ***  BMI is There is no height or weight on file to calculate BMI., she hasn't had much of an appetite, has been going to gym with silver sneakers.  Wt Readings from Last 3 Encounters:  01/28/19 149 lb 6.4 oz (67.8 kg)  01/05/19 143 lb (64.9 kg)  09/23/18 143 lb (64.9 kg)   She has atherosclerosis of the aorta per CXR 10/2017.  Her blood pressure has been controlled at home, today their BP is    She does workout. She denies chest pain, shortness of breath, dizziness.   She is not on cholesterol medication; she alleges severe myalgias on statins and zetia, declined trial of welchol. *** nexlitol ***. She has been referred to lipid clinic but ultimately declined PCSK9i Her cholesterol is not at goal. The cholesterol last visit was:   Lab Results  Component Value Date   CHOL 239 (H) 01/28/2019   HDL 72 01/28/2019   LDLCALC 147 (H) 01/28/2019   LDLDIRECT 139.2 04/29/2012   TRIG 93 01/28/2019   CHOLHDL 3.3 01/28/2019    She has been working on diet and exercise for hx of prediabetes, and denies foot ulcerations, increased appetite, nausea, paresthesia of the feet, polydipsia, polyuria, visual disturbances and vomiting. Last A1C in the office was:  Lab Results  Component Value Date   HGBA1C 5.6 01/28/2019   Lab Results  Component Value Date   GFRNONAA 83 01/28/2019   Patient is on Vitamin D supplement.   Lab Results  Component Value Date   VD25OH 66 01/28/2019        Current Medications:  Current Outpatient Medications on File Prior to  Visit  Medication Sig  . busPIRone (BUSPAR) 5 MG tablet TAKE 1/2 TO 1 TABLET 3 X /DAY ON A ROUTINE BASIS FOR CHRONIC ANXIETY  . cholecalciferol (VITAMIN D) 1000 UNITS tablet Take 5,000 Units by mouth daily.   Marland Kitchen ibuprofen  (ADVIL,MOTRIN) 200 MG tablet Take 400 mg by mouth daily as needed for moderate pain.   . Multiple Vitamins-Minerals (ICAPS LUTEIN & OMEGA-3 PO) Take 1 capsule by mouth daily.  . sertraline (ZOLOFT) 50 MG tablet Take 1 tablet daily for Mood  . triamcinolone ointment (KENALOG) 0.5 % Apply 1 application topically 2 (two) times daily. Apply to hand eczema 2 x /day   No current facility-administered medications on file prior to visit.      Allergies:  Allergies  Allergen Reactions  . Crestor [Rosuvastatin Calcium] Other (See Comments)    myalgias  . Red Yeast Rice [Cholestin] Other (See Comments)    Dizzy  . Tape Other (See Comments)    Pt states that skin burns, and it hurts severely.  Tori Milks [Naproxen Sodium] Rash  . Darvon [Propoxyphene] Nausea And Vomiting and Anxiety  . Latex Rash    Contact makes the skin feel "burned"     Medical History:  Past Medical History:  Diagnosis Date  . Anxiety   . DCIS (ductal carcinoma in situ) of breast 04/29/2012  . HTN (hypertension)   . Hyperlipidemia   . Pre-diabetes   . Vitamin D deficiency    Family history- Reviewed and unchanged Social history- Reviewed and unchanged   Review of Systems:  Review of Systems  Constitutional: Negative for malaise/fatigue and weight loss.  HENT: Negative for hearing loss and tinnitus.   Eyes: Negative for blurred vision and double vision.  Respiratory: Negative for cough, shortness of breath and wheezing.   Cardiovascular: Negative for chest pain, palpitations, orthopnea, claudication and leg swelling.  Gastrointestinal: Negative for abdominal pain, blood in stool, constipation, diarrhea, heartburn, melena, nausea and vomiting.  Genitourinary: Negative.   Musculoskeletal: Positive for back pain. Negative for joint pain and myalgias.  Skin: Negative for rash.  Neurological: Negative for dizziness, tingling, sensory change, weakness and headaches.  Endo/Heme/Allergies: Negative for polydipsia.   Psychiatric/Behavioral: Negative for depression, substance abuse and suicidal ideas. The patient is nervous/anxious. The patient does not have insomnia.   All other systems reviewed and are negative.     Physical Exam: There were no vitals taken for this visit. Wt Readings from Last 3 Encounters:  01/28/19 149 lb 6.4 oz (67.8 kg)  01/05/19 143 lb (64.9 kg)  09/23/18 143 lb (64.9 kg)   General Appearance: Well nourished, in no apparent distress. Eyes: PERRLA, EOMs, conjunctiva no swelling or erythema Sinuses: No Frontal/maxillary tenderness ENT/Mouth: Ext aud canals clear, TMs without erythema, bulging. No erythema, swelling, or exudate on post pharynx.  Tonsils not swollen or erythematous. Hearing normal.  Neck: Supple, thyroid normal.  Respiratory: Respiratory effort normal, BS equal bilaterally without rales, rhonchi, wheezing or stridor.  Cardio: RRR with no MRGs. Brisk peripheral pulses without edema.  Abdomen: Soft, + BS.  Non tender, no guarding, rebound, hernias, masses. Lymphatics: Non tender without lymphadenopathy.  Musculoskeletal: Full ROM, 5/5 strength, Normal gait Skin: Warm, dry without rashes, lesions, ecchymosis.  Neuro: Cranial nerves intact. No cerebellar symptoms.  Psych: Awake and oriented X 3, normal affect, Insight and Judgment appropriate.    Izora Ribas, NP 8:26 AM [facility]

## 2019-08-02 ENCOUNTER — Other Ambulatory Visit: Payer: Self-pay

## 2019-08-02 ENCOUNTER — Ambulatory Visit (INDEPENDENT_AMBULATORY_CARE_PROVIDER_SITE_OTHER): Payer: Medicare Other | Admitting: Adult Health

## 2019-08-02 ENCOUNTER — Encounter: Payer: Self-pay | Admitting: Adult Health

## 2019-08-02 ENCOUNTER — Ambulatory Visit: Payer: Medicare Other | Admitting: Adult Health

## 2019-08-02 VITALS — BP 136/74 | HR 81 | Temp 97.3°F | Ht 62.0 in | Wt 152.0 lb

## 2019-08-02 DIAGNOSIS — I700 Atherosclerosis of aorta: Secondary | ICD-10-CM | POA: Diagnosis not present

## 2019-08-02 DIAGNOSIS — Z79899 Other long term (current) drug therapy: Secondary | ICD-10-CM

## 2019-08-02 DIAGNOSIS — E782 Mixed hyperlipidemia: Secondary | ICD-10-CM | POA: Diagnosis not present

## 2019-08-02 DIAGNOSIS — R0989 Other specified symptoms and signs involving the circulatory and respiratory systems: Secondary | ICD-10-CM | POA: Diagnosis not present

## 2019-08-02 DIAGNOSIS — R7303 Prediabetes: Secondary | ICD-10-CM

## 2019-08-02 DIAGNOSIS — E559 Vitamin D deficiency, unspecified: Secondary | ICD-10-CM

## 2019-08-02 DIAGNOSIS — E663 Overweight: Secondary | ICD-10-CM

## 2019-08-02 DIAGNOSIS — R7309 Other abnormal glucose: Secondary | ICD-10-CM

## 2019-08-02 DIAGNOSIS — F3341 Major depressive disorder, recurrent, in partial remission: Secondary | ICD-10-CM

## 2019-08-02 MED ORDER — ESCITALOPRAM OXALATE 5 MG PO TABS: 5.0000 mg | ORAL_TABLET | Freq: Every day | ORAL | 1 refills | Status: DC

## 2019-08-02 NOTE — Patient Instructions (Addendum)
Goals    . LDL CALC < 130    . Weight (lb) < 145 lb (65.8 kg)      Please call insurance and ask if they cover shingrix (new/better - can get at [facility]) or zostavax (older shingles vaccine)    Can try melatonin 5mg -15 mg at night for sleep, can also do benadryl 25-50mg  at night for sleep.  If this does not help we can try prescription medication.  Also here is some information about good sleep hygiene.   Insomnia Insomnia is frequent trouble falling and/or staying asleep. Insomnia can be a long term problem or a short term problem. Both are common. Insomnia can be a short term problem when the wakefulness is related to a certain stress or worry. Long term insomnia is often related to ongoing stress during waking hours and/or poor sleeping habits. Overtime, sleep deprivation itself can make the problem worse. Every little thing feels more severe because you are overtired and your ability to cope is decreased. CAUSES   Stress, anxiety, and depression.  Poor sleeping habits.  Distractions such as TV in the bedroom.  Naps close to bedtime.  Engaging in emotionally charged conversations before bed.  Technical reading before sleep.  Alcohol and other sedatives. They may make the problem worse. They can hurt normal sleep patterns and normal dream activity.  Stimulants such as caffeine for several hours prior to bedtime.  Pain syndromes and shortness of breath can cause insomnia.  Exercise late at night.  Changing time zones may cause sleeping problems (jet lag). It is sometimes helpful to have someone observe your sleeping patterns. They should look for periods of not breathing during the night (sleep apnea). They should also look to see how long those periods last. If you live alone or observers are uncertain, you can also be observed at a sleep clinic where your sleep patterns will be professionally monitored. Sleep apnea requires a checkup and treatment. Give your caregivers your  medical history. Give your caregivers observations your family has made about your sleep.  SYMPTOMS   Not feeling rested in the morning.  Anxiety and restlessness at bedtime.  Difficulty falling and staying asleep. TREATMENT   Your caregiver may prescribe treatment for an underlying medical disorders. Your caregiver can give advice or help if you are using alcohol or other drugs for self-medication. Treatment of underlying problems will usually eliminate insomnia problems.  Medications can be prescribed for short time use. They are generally not recommended for lengthy use.  Over-the-counter sleep medicines are not recommended for lengthy use. They can be habit forming.  You can promote easier sleeping by making lifestyle changes such as:  Using relaxation techniques that help with breathing and reduce muscle tension.  Exercising earlier in the day.  Changing your diet and the time of your last meal. No night time snacks.  Establish a regular time to go to bed.  Counseling can help with stressful problems and worry.  Soothing music and white noise may be helpful if there are background noises you cannot remove.  Stop tedious detailed work at least one hour before bedtime. HOME CARE INSTRUCTIONS   Keep a diary. Inform your caregiver about your progress. This includes any medication side effects. See your caregiver regularly. Take note of:  Times when you are asleep.  Times when you are awake during the night.  The quality of your sleep.  How you feel the next day. This information will help your caregiver care for you.  Get out of bed if you are still awake after 15 minutes. Read or do some quiet activity. Keep the lights down. Wait until you feel sleepy and go back to bed.  Keep regular sleeping and waking hours. Avoid naps.  Exercise regularly.  Avoid distractions at bedtime. Distractions include watching television or engaging in any intense or detailed activity  like attempting to balance the household checkbook.  Develop a bedtime ritual. Keep a familiar routine of bathing, brushing your teeth, climbing into bed at the same time each night, listening to soothing music. Routines increase the success of falling to sleep faster.  Use relaxation techniques. This can be using breathing and muscle tension release routines. It can also include visualizing peaceful scenes. You can also help control troubling or intruding thoughts by keeping your mind occupied with boring or repetitive thoughts like the old concept of counting sheep. You can make it more creative like imagining planting one beautiful flower after another in your backyard garden.  During your day, work to eliminate stress. When this is not possible use some of the previous suggestions to help reduce the anxiety that accompanies stressful situations. MAKE SURE YOU:   Understand these instructions.  Will watch your condition.  Will get help right away if you are not doing well or get worse. Document Released: 08/16/2000 Document Revised: 11/11/2011 Document Reviewed: 09/16/2007 [facility] Patient Information 2015 [place], [place]. This information is not intended to replace advice given to you by your health care provider. Make sure you discuss any questions you have with your health care provider.   Escitalopram tablets What is this medicine? ESCITALOPRAM (es sye TAL oh pram) is used to treat depression and certain types of anxiety. This medicine may be used for other purposes; ask your health care provider or pharmacist if you have questions. COMMON BRAND NAME(S): Lexapro What should I tell my health care provider before I take this medicine? They need to know if you have any of these conditions:  bipolar disorder or a family history of bipolar disorder  diabetes  glaucoma  heart disease  kidney or liver disease  receiving electroconvulsive therapy  seizures  (convulsions)  suicidal thoughts, plans, or attempt by you or a family member  an unusual or allergic reaction to escitalopram, the related drug citalopram, other medicines, foods, dyes, or preservatives  pregnant or trying to become pregnant  breast-feeding How should I use this medicine? Take this medicine by mouth with a glass of water. Follow the directions on the prescription label. You can take it with or without food. If it upsets your stomach, take it with food. Take your medicine at regular intervals. Do not take it more often than directed. Do not stop taking this medicine suddenly except upon the advice of your doctor. Stopping this medicine too quickly may cause serious side effects or your condition may worsen. A special MedGuide will be given to you by the pharmacist with each prescription and refill. Be sure to read this information carefully each time. Talk to your pediatrician regarding the use of this medicine in children. Special care may be needed. Overdosage: If you think you have taken too much of this medicine contact a poison control center or emergency room at once. NOTE: This medicine is only for you. Do not share this medicine with others. What if I miss a dose? If you miss a dose, take it as soon as you can. If it is almost time for your next dose, take only  that dose. Do not take double or extra doses. What may interact with this medicine? Do not take this medicine with any of the following medications:  certain medicines for fungal infections like fluconazole, itraconazole, ketoconazole, posaconazole, voriconazole  cisapride  citalopram  dronedarone  linezolid  MAOIs like Carbex, Eldepryl, Marplan, Nardil, and Parnate  methylene blue (injected into a vein)  pimozide  thioridazine This medicine may also interact with the following medications:  alcohol  amphetamines  aspirin and aspirin-like medicines  carbamazepine  certain medicines for  depression, anxiety, or psychotic disturbances  certain medicines for migraine headache like almotriptan, eletriptan, frovatriptan, naratriptan, rizatriptan, sumatriptan, zolmitriptan  certain medicines for sleep  certain medicines that treat or prevent blood clots like warfarin, enoxaparin, dalteparin  cimetidine  diuretics  dofetilide  fentanyl  furazolidone  isoniazid  lithium  metoprolol  NSAIDs, medicines for pain and inflammation, like ibuprofen or naproxen  other medicines that prolong the QT interval (cause an abnormal heart rhythm)  procarbazine  rasagiline  supplements like St. John's wort, kava kava, valerian  tramadol  tryptophan  ziprasidone This list may not describe all possible interactions. Give your health care provider a list of all the medicines, herbs, non-prescription drugs, or dietary supplements you use. Also tell them if you smoke, drink alcohol, or use illegal drugs. Some items may interact with your medicine. What should I watch for while using this medicine? Tell your doctor if your symptoms do not get better or if they get worse. Visit your doctor or health care professional for regular checks on your progress. Because it may take several weeks to see the full effects of this medicine, it is important to continue your treatment as prescribed by your doctor. Patients and their families should watch out for new or worsening thoughts of suicide or depression. Also watch out for sudden changes in feelings such as feeling anxious, agitated, panicky, irritable, hostile, aggressive, impulsive, severely restless, overly excited and hyperactive, or not being able to sleep. If this happens, especially at the beginning of treatment or after a change in dose, call your health care professional. Dennis Bast may get drowsy or dizzy. Do not drive, use machinery, or do anything that needs mental alertness until you know how this medicine affects you. Do not stand or  sit up quickly, especially if you are an older patient. This reduces the risk of dizzy or fainting spells. Alcohol may interfere with the effect of this medicine. Avoid alcoholic drinks. Your mouth may get dry. Chewing sugarless gum or sucking hard candy, and drinking plenty of water may help. Contact your doctor if the problem does not go away or is severe. What side effects may I notice from receiving this medicine? Side effects that you should report to your doctor or health care professional as soon as possible:  allergic reactions like skin rash, itching or hives, swelling of the face, lips, or tongue  anxious  black, tarry stools  changes in vision  confusion  elevated mood, decreased need for sleep, racing thoughts, impulsive behavior  eye pain  fast, irregular heartbeat  feeling faint or lightheaded, falls  feeling agitated, angry, or irritable  hallucination, loss of contact with reality  loss of balance or coordination  loss of memory  painful or prolonged erections  restlessness, pacing, inability to keep still  seizures  stiff muscles  suicidal thoughts or other mood changes  trouble sleeping  unusual bleeding or bruising  unusually weak or tired  vomiting Side effects that  usually do not require medical attention (report to your doctor or health care professional if they continue or are bothersome):  changes in appetite  change in sex drive or performance  headache  increased sweating  indigestion, nausea  tremors This list may not describe all possible side effects. Call your doctor for medical advice about side effects. You may report side effects to FDA at 1-800-FDA-1088. Where should I keep my medicine? Keep out of reach of children. Store at room temperature between 15 and 30 degrees C (59 and 86 degrees F). Throw away any unused medicine after the expiration date. NOTE: This sheet is a summary. It may not cover all possible  information. If you have questions about this medicine, talk to your doctor, pharmacist, or health care provider.  2020 Elsevier/Gold Standard (2018-08-10 11:21:44)

## 2019-08-02 NOTE — Progress Notes (Signed)
FOLLOW UP  Assessment and Plan:   Atherosclerosis of aorta Per imaging; 10/2017 Control blood pressure, cholesterol, glucose, increase exercise.   Cholesterol Currently above goal; patient alleges severe statin/zetia intolerance; declined resins, PCSK9i, recommended she add soluble fiber supplement Continue low cholesterol diet and exercise.  Check lipid panel.   Hx of prediabetes Recent A1Cs at goal Discussed diet/exercise, weight management  Defer A1C; check CMP  Overweight Long discussion about weight loss, diet, and exercise Recommended diet heavy in fruits and veggies and low in animal meats, cheeses, and dairy products, appropriate calorie intake Discussed ideal weight for height and initial weight goal (145lb) Will follow up in 3 months  Vitamin D Def At goal at last visit; continue supplementation to maintain goal of 60-100 Defer Vit D level  Recurrent major depression/anxiety, active She is interested in trying alternate SSRI; declines buspar; will try low dose lexapro 5 mg daily Discussed needs to try 8-12 weeks for full benefit Call if any SE or concerns Lifestyle discussed: diet/exerise, sleep hygiene, stress management, hydration   Continue diet and meds as discussed. Further disposition pending results of labs. Discussed med's effects and SE's.   Over 30 minutes of exam, counseling, chart review, and critical decision making was performed.   Future Appointments  Date Time Provider [place]  08/02/2019  3:15 PM Liane Comber, NP [facility] None  01/28/2020 10:30 AM Garnet Sierras, NP [facility] None  02/23/2020 10:00 AM Unk Pinto, MD [facility] None  03/08/2020 10:00 AM Unk Pinto, MD [facility] None    ----------------------------------------------------------------------------------------------------------------------  HPI 68 y.o. female  presents for 3 month follow up on hypertension, cholesterol, prediabetes, weight and vitamin  D deficiency.   She has ongoing depression treated by zoloft 50 mg daily, has been taking intermittently for the past 15 years after her husband's passing, and recently was  Prescribed buspar TID PRN but she never started; she reported under a lot of stress r/t her daughter's unexpected separation. She was previously on xanax but off of this due to using mainly for sleep. Hasn't tried melatonin or benadryl.   She follows with ortho for back; she had wrist fracture earlier this year in 09/2018 and underwent ORIF; and recently finished up with occupational therapy. She reports she is doing well.   BMI is Body mass index is 27.8 kg/m., she reports has been working on diet/exercise, walks daily.  Wt Readings from Last 3 Encounters:  08/02/19 152 lb (68.9 kg)  01/28/19 149 lb 6.4 oz (67.8 kg)  01/05/19 143 lb (64.9 kg)   She has atherosclerosis of the aorta per CXR 10/2017.  Her blood pressure has been controlled at home, today their BP is BP: 136/74  She does workout. She denies chest pain, shortness of breath, dizziness.   She is not on cholesterol medication; she alleges severe myalgias on statins and zetia, declined trial of welchol. She has been referred to lipid clinic but ultimately declined PCSK9i Her cholesterol is not at goal. She is trying to limit fried foods, eating more salads, etc. The cholesterol last visit was:   Lab Results  Component Value Date   CHOL 239 (H) 01/28/2019   HDL 72 01/28/2019   LDLCALC 147 (H) 01/28/2019   LDLDIRECT 139.2 04/29/2012   TRIG 93 01/28/2019   CHOLHDL 3.3 01/28/2019    She has been working on diet and exercise for hx of prediabetes, and denies foot ulcerations, increased appetite, nausea, paresthesia of the feet, polydipsia, polyuria, visual disturbances and vomiting. Last A1C  in the office was:  Lab Results  Component Value Date   HGBA1C 5.6 01/28/2019   Lab Results  Component Value Date   GFRNONAA 83 01/28/2019   Patient is on Vitamin D  supplement.   Lab Results  Component Value Date   VD25OH 66 01/28/2019        Current Medications:  Current Outpatient Medications on File Prior to Visit  Medication Sig  . cholecalciferol (VITAMIN D) 1000 UNITS tablet Take 5,000 Units by mouth daily.   Marland Kitchen ibuprofen (ADVIL,MOTRIN) 200 MG tablet Take 400 mg by mouth daily as needed for moderate pain.   . Multiple Vitamins-Minerals (ICAPS LUTEIN & OMEGA-3 PO) Take 1 capsule by mouth daily.  Marland Kitchen triamcinolone ointment (KENALOG) 0.5 % Apply 1 application topically 2 (two) times daily. Apply to hand eczema 2 x /day (Patient not taking: Reported on 08/02/2019)   No current facility-administered medications on file prior to visit.      Allergies:  Allergies  Allergen Reactions  . Crestor [Rosuvastatin Calcium] Other (See Comments)    myalgias  . Red Yeast Rice [Cholestin] Other (See Comments)    Dizzy  . Tape Other (See Comments)    Pt states that skin burns, and it hurts severely.  Tori Milks [Naproxen Sodium] Rash  . Darvon [Propoxyphene] Nausea And Vomiting and Anxiety  . Latex Rash    Contact makes the skin feel "burned"     Medical History:  Past Medical History:  Diagnosis Date  . Anxiety   . DCIS (ductal carcinoma in situ) of breast 04/29/2012  . HTN (hypertension)   . Hyperlipidemia   . Pre-diabetes   . Vitamin D deficiency    Family history- Reviewed and unchanged Social history- Reviewed and unchanged   Review of Systems:  Review of Systems  Constitutional: Negative for malaise/fatigue and weight loss.  HENT: Negative for hearing loss and tinnitus.   Eyes: Negative for blurred vision and double vision.  Respiratory: Negative for cough, shortness of breath and wheezing.   Cardiovascular: Negative for chest pain, palpitations, orthopnea, claudication and leg swelling.  Gastrointestinal: Negative for abdominal pain, blood in stool, constipation, diarrhea, heartburn, melena, nausea and vomiting.  Genitourinary:  Negative.   Musculoskeletal: Negative for back pain, joint pain and myalgias.  Skin: Negative for rash.  Neurological: Negative for dizziness, tingling, sensory change, weakness and headaches.  Endo/Heme/Allergies: Negative for polydipsia.  Psychiatric/Behavioral: Negative for depression, substance abuse and suicidal ideas. The patient is nervous/anxious and has insomnia.   All other systems reviewed and are negative.   Physical Exam: BP 136/74   Pulse 81   Temp (!) 97.3 F (36.3 C)   Ht 5\' 2"  (1.575 m)   Wt 152 lb (68.9 kg)   SpO2 97%   BMI 27.80 kg/m  Wt Readings from Last 3 Encounters:  08/02/19 152 lb (68.9 kg)  01/28/19 149 lb 6.4 oz (67.8 kg)  01/05/19 143 lb (64.9 kg)   General Appearance: Well nourished, in no apparent distress. Eyes: PERRLA, EOMs, conjunctiva no swelling or erythema Sinuses: No Frontal/maxillary tenderness ENT/Mouth: Ext aud canals clear, TMs without erythema, bulging. No erythema, swelling, or exudate on post pharynx.  Tonsils not swollen or erythematous. Hearing normal.  Neck: Supple, thyroid normal.  Respiratory: Respiratory effort normal, BS equal bilaterally without rales, rhonchi, wheezing or stridor.  Cardio: RRR with no MRGs. Brisk peripheral pulses without edema.  Abdomen: Soft, + BS.  Non tender, no guarding, rebound, hernias, masses. Lymphatics: Non tender without lymphadenopathy.  Musculoskeletal: Full ROM, 5/5 strength, Normal gait Skin: Warm, dry without rashes, lesions, ecchymosis.  Neuro: Cranial nerves intact. No cerebellar symptoms.  Psych: Awake and oriented X 3, normal affect, Insight and Judgment appropriate.    Izora Ribas, NP 3:00 PM [facility]

## 2019-08-03 LAB — CBC WITH DIFFERENTIAL/PLATELET
Absolute Monocytes: 482 cells/uL (ref 200–950)
Basophils Absolute: 28 cells/uL (ref 0–200)
Basophils Relative: 0.5 %
Eosinophils Absolute: 90 cells/uL (ref 15–500)
Eosinophils Relative: 1.6 %
HCT: 41.2 % (ref 35.0–45.0)
Hemoglobin: 13.8 g/dL (ref 11.7–15.5)
Lymphs Abs: 2391 cells/uL (ref 850–3900)
MCH: 29.2 pg (ref 27.0–33.0)
MCHC: 33.5 g/dL (ref 32.0–36.0)
MCV: 87.3 fL (ref 80.0–100.0)
MPV: 11.3 fL (ref 7.5–12.5)
Monocytes Relative: 8.6 %
Neutro Abs: 2610 cells/uL (ref 1500–7800)
Neutrophils Relative %: 46.6 %
Platelets: 181 10*3/uL (ref 140–400)
RBC: 4.72 10*6/uL (ref 3.80–5.10)
RDW: 12.9 % (ref 11.0–15.0)
Total Lymphocyte: 42.7 %
WBC: 5.6 10*3/uL (ref 3.8–10.8)

## 2019-08-03 LAB — COMPLETE METABOLIC PANEL WITH GFR
AG Ratio: 1.7 (calc) (ref 1.0–2.5)
ALT: 8 U/L (ref 6–29)
AST: 13 U/L (ref 10–35)
Albumin: 3.8 g/dL (ref 3.6–5.1)
Alkaline phosphatase (APISO): 71 U/L (ref 37–153)
BUN: 12 mg/dL (ref 7–25)
CO2: 29 mmol/L (ref 20–32)
Calcium: 9.4 mg/dL (ref 8.6–10.4)
Chloride: 104 mmol/L (ref 98–110)
Creat: 0.77 mg/dL (ref 0.60–0.93)
GFR, Est African American: 91 mL/min/{1.73_m2} (ref >=60)
GFR, Est Non African American: 78 mL/min/{1.73_m2} (ref >=60)
Globulin: 2.3 g/dL (calc) (ref 1.9–3.7)
Glucose, Bld: 114 mg/dL — ABNORMAL HIGH (ref 65–99)
Potassium: 3.8 mmol/L (ref 3.5–5.3)
Sodium: 139 mmol/L (ref 135–146)
Total Bilirubin: 0.4 mg/dL (ref 0.2–1.2)
Total Protein: 6.1 g/dL (ref 6.1–8.1)

## 2019-08-03 LAB — TSH: TSH: 0.41 mIU/L (ref 0.40–4.50)

## 2019-08-03 LAB — LIPID PANEL
Cholesterol: 241 mg/dL — ABNORMAL HIGH (ref <200)
HDL: 69 mg/dL (ref >=50)
LDL Cholesterol (Calc): 145 mg/dL (calc) — ABNORMAL HIGH
Non-HDL Cholesterol (Calc): 172 mg/dL (calc) — ABNORMAL HIGH (ref <130)
Total CHOL/HDL Ratio: 3.5 (calc) (ref <5.0)
Triglycerides: 145 mg/dL (ref <150)

## 2019-11-09 ENCOUNTER — Ambulatory Visit: Payer: Medicare Other | Admitting: Adult Health Nurse Practitioner

## 2019-11-18 ENCOUNTER — Ambulatory Visit: Payer: Medicare Other | Admitting: Adult Health Nurse Practitioner

## 2019-11-25 ENCOUNTER — Ambulatory Visit (INDEPENDENT_AMBULATORY_CARE_PROVIDER_SITE_OTHER): Payer: Medicare Other | Admitting: Adult Health Nurse Practitioner

## 2019-11-25 ENCOUNTER — Other Ambulatory Visit: Payer: Self-pay

## 2019-11-25 ENCOUNTER — Encounter: Payer: Self-pay | Admitting: Adult Health Nurse Practitioner

## 2019-11-25 VITALS — BP 122/68 | HR 62 | Temp 97.3°F | Ht 62.0 in | Wt 151.2 lb

## 2019-11-25 DIAGNOSIS — F3341 Major depressive disorder, recurrent, in partial remission: Secondary | ICD-10-CM

## 2019-11-25 DIAGNOSIS — R6889 Other general symptoms and signs: Secondary | ICD-10-CM | POA: Diagnosis not present

## 2019-11-25 DIAGNOSIS — R7309 Other abnormal glucose: Secondary | ICD-10-CM

## 2019-11-25 DIAGNOSIS — R0989 Other specified symptoms and signs involving the circulatory and respiratory systems: Secondary | ICD-10-CM

## 2019-11-25 DIAGNOSIS — Z0001 Encounter for general adult medical examination with abnormal findings: Secondary | ICD-10-CM | POA: Diagnosis not present

## 2019-11-25 DIAGNOSIS — R7303 Prediabetes: Secondary | ICD-10-CM | POA: Diagnosis not present

## 2019-11-25 DIAGNOSIS — E663 Overweight: Secondary | ICD-10-CM

## 2019-11-25 DIAGNOSIS — L820 Inflamed seborrheic keratosis: Secondary | ICD-10-CM

## 2019-11-25 DIAGNOSIS — E559 Vitamin D deficiency, unspecified: Secondary | ICD-10-CM

## 2019-11-25 DIAGNOSIS — E782 Mixed hyperlipidemia: Secondary | ICD-10-CM | POA: Diagnosis not present

## 2019-11-25 DIAGNOSIS — Z0000 Encounter for general adult medical examination without abnormal findings: Secondary | ICD-10-CM

## 2019-11-25 MED ORDER — TRIAMCINOLONE ACETONIDE 0.5 % EX OINT: 1.0000 "application " | TOPICAL_OINTMENT | Freq: Two times a day (BID) | CUTANEOUS | 3 refills | Status: DC

## 2019-11-25 NOTE — Patient Instructions (Addendum)
Regina Shaw , Thank you for taking time to come for your Medicare Wellness Visit. I appreciate your ongoing commitment to your health goals. Please review the following plan we discussed and let me know if I can assist you in the future.   These are the goals we discussed: Goals    . LDL CALC < 130    . Weight (lb) < 145 lb (65.8 kg)       This is a list of the screening recommended for you and due dates:  Health Maintenance  Topic Date Due  . Mammogram  02/08/2020  . Cologuard (Stool DNA test)  01/15/2021  . Tetanus Vaccine  10/18/2024  . Flu Shot  Completed  . DEXA scan (bone density measurement)  Completed  .  Hepatitis C: One time screening is recommended by Center for Disease Control  (CDC) for  adults born from [birth year] through 1967.   Completed  . Pneumonia vaccines  Completed      For excessive gas:  Try Gas-X (simethacone) they typically are chewable and take one before each meal.  You can also try Beano.  This is a pill you take before meals.  Avoid drinking through a straw.   Nextletol  is the medication to help lower your choesterol.  This is NOT a statin and works to lower cholesterol outside of the muscle thus reduction in any muscular side effects.  Take one tablet daily.  We will follow up in two weeks.    Please contact office if you do not tolerate this medication.  Our next step would be to discuss sending you to Lipid Clinic for further management.     Below is information about the Shingrx vaccination:    You are due for the Shingrix vaccination.  You may get this an your local pharmacy.  Contact them for available to this.  There may be a wait list.  Be low is some information about this vaccination from [facility], [place].  Shingrix, which was approved by the FDA in October 2017. Doses are given two to six months apart. Shingrix is said to be more than 90% effective against shingles and postherpetic neuralgia -- a  painful nerve condition that can arise as a shingles complication.  Zostavax has been used since 2006 and has been reported to reduce the risk of shingles by 51%. Research has also shown that Zostavax loses its ability to prevent shingles after five years. If you've ever had chickenpox, you are at risk for shingles, which is essentially a re-emergence of the virus that caused your chickenpox. The CDC recommends to get the Shingrix vaccination even if you aren't sure you've had chickenpox and if you've already had shingles. Although it's uncommon, you can get shingles more than once. In addition, you should get the Shingrix vaccine even if you already got the Zostavax vaccine.  The wait time between these vaccinations is eight weeks.  Who Should Get Shingrix? Healthy adults 50 years and older should get two doses of Shingrix, separated by 2 to 6 months. You should get Shingrix even if in the past you . had shingles  . received Zostavax  . are not sure if you had chickenpox There is no maximum age for getting Shingrix. If you had shingles in the past, you can get Shingrix to help prevent future occurrences of the disease. There is no specific length of time that you need to wait after  having shingles before you can receive Shingrix, but generally you should make sure the shingles rash has gone away before getting vaccinated. You can get Shingrix whether or not you remember having had chickenpox in the past. Studies show that more than 99% of Americans 40 years and older have had chickenpox, even if they don't remember having the disease. Chickenpox and shingles are related because they are caused by the same virus (varicella zoster virus). After a person recovers from chickenpox, the virus stays dormant (inactive) in the body. It can reactivate years later and cause shingles. If you had Zostavax in the recent past, you should wait at least eight weeks before getting Shingrix. Talk to your healthcare  provider to determine the best time to get Shingrix. Shingrix is available in [employer] and pharmacies. To find doctor's offices or pharmacies near you that offer the vaccine, visit [facility] Vaccine FinderExternal. If you have questions about Shingrix, talk with your healthcare provider. Vaccine for Those 40 Years and Older  Shingrix reduces the risk of shingles and PHN by more than 90% in people 9 and older. CDC recommends the vaccine for healthy adults 85 and older.   Who Should Not Get Shingrix? You should not get Shingrix if you: . have ever had a severe allergic reaction to any component of the vaccine or after a dose of Shingrix  . tested negative for immunity to varicella zoster virus. If you test negative, you should get chickenpox vaccine.  . currently have shingles  . currently are pregnant or breastfeeding. Women who are pregnant or breastfeeding should wait to get Shingrix.  Marland Kitchen receive specific antiviral drugs (acyclovir, famciclovir, or valacyclovir) 24 hours before vaccination (avoid use of these antiviral drugs for 14 days after vaccination)- zoster vaccine live only If you have a minor acute (starts suddenly) illness, such as a cold, you may get Shingrix. But if you have a moderate or severe acute illness, you should usually wait until you recover before getting the vaccine. This includes anyone with a temperature of 101.16F or higher. The side effects of the Shingrix are temporary, and usually last 2 to 3 days. While you may experience pain for a few days after getting Shingrix, the pain will be less severe than having shingles and the complications from the disease.  Risks of Shingrix vaccination:      A sore arm with mild or moderate pain is very common after recombinant shingles vaccine, affecting about 80% of vaccinated people. Redness and swelling can also happen at the site of the injection.     Tiredness, muscle pain, headache, shivering, fever, stomach pain, and  nausea happen after vaccination in more than half of people who receive recombinant shingles vaccine.  In clinical trials, about 1 out of 6 people who got recombinant zoster vaccine experienced side effects that prevented them from doing regular activities. Symptoms usually went away on their own in 2 to 3 days.  You should still get the second dose of recombinant zoster vaccine even if you had one of these reactions after the first dose.  People sometimes faint after medical procedures, including vaccination. Tell your provider if you feel dizzy or have vision changes or ringing in the ears.  As with any medicine, there is a very remote chance of a vaccine causing a severe allergic reaction, other serious injury, or death.

## 2019-11-25 NOTE — Progress Notes (Addendum)
MEDICARE ANNUAL WELLNESS VISIT AND 3 MONTH FOLLOW UP Assessment:    Regina Shaw was seen today for medicare wellness.  Diagnoses and all orders for this visit:  Medicare annual wellness visit, subsequent Yearly  Labile hypertension Continue to monitor Continue healthy eating habits and exercise Doing well  BMI 26.0-26.9,adult Discussed dietary and exercise modifications  Hyperlipidemia, mixed Discussed dietary and exercise modifications No medications at this time Will check labs next office visit  Prediabetes Doing well Continue healthy eating habits and exercise  Vitamin D deficiency Taking supplementation Will check level next office visit  Depression, controlled Doing well No medications at this time  Medication management Continued  Seborrheic keratoses, inflamed Using OTC protective ointment Discussed second dermatology opinion? Hematology?  Immunization counseling Discussed Shingrix, local pharm Desired to have Tdap vaccination as her daughter is due at end of month.  Vaccinated in 2016.  Discussed this is good for 10 years. Still wants vaccination. Will discussed at next office visit/CPE  Follow Up Instructions:    I discussed the assessment and treatment plan with the patient. The patient was provided an opportunity to ask questions and all were answered. The patient agreed with the plan and demonstrated an understanding of the instructions.   The patient was advised to call back or seek an in-person evaluation if the symptoms worsen or if the condition fails to improve as anticipated.  I provided 30 minutes of face-to-face time during this encounter including counseling, chart review, and critical decision making was preformed.   Future Appointments  Date Time Provider [place]  12/16/2019 11:00 AM Garnet Sierras, NP [facility] None  02/23/2020 10:00 AM Unk Pinto, MD [facility] None      Plan:   During the course of the  visit the patient was educated and counseled about appropriate screening and preventive services including:    Pneumococcal vaccine   Influenza vaccine  Prevnar 13  Td vaccine  Screening electrocardiogram, deferred, telephone visit [place].  Colorectal cancer screening  Diabetes screening  Glaucoma screening  Nutrition counseling    Subjective:  Regina Shaw is a 69 y.o. female who presents for Medicare Annual Wellness Visit and 3 month follow up for labile HTN not on medication, hyperlipidemia, prediabetes, chronic seborrheic keratosis inflamed, and vitamin D Def.     On 09/19/18 she had a fall after going for a run.  She stepped off the curb onto what she thought was dirt but was slippery mud and she slid with Regina Shaw to left hand. Closed fracture of distal end of left radius with ORIF, plates and pins during hospital admission.  She is now doing physical therapy and reports this is going well.  She just had a follow up with Dr Caralyn Guile and he recommends therapy for one more month. She reports her pain is controlled.    Reports she has a rash that is on her legs and top of her hands she has battled with for years.  She has been evaluated in our office as well as dermatology and unable to find a regiment to resolve this.  Reports it is itchy and scaly, no open areas at this time.  She has most recently tried protective ointment, ie diaper rash cream, that has helped the most.  Her blood pressure has been controlled at home, today their BP is BP: 122/68 She does workout. She denies chest pain, shortness of breath, dizziness.  She is not on cholesterol medication and denies myalgias. Her cholesterol is not at  goal. The cholesterol last visit was:   Lab Results  Component Value Date   CHOL 241 (H) 08/02/2019   HDL 69 08/02/2019   LDLCALC 145 (H) 08/02/2019   LDLDIRECT 139.2 04/29/2012   TRIG 145 08/02/2019   CHOLHDL 3.5 08/02/2019   She has been working on diet and exercise for  prediabetes, and denies hyperglycemia, hypoglycemia , increased appetite, nausea, paresthesia of the feet, polydipsia, polyuria, visual disturbances and vomiting. Last A1C in the office was:  Lab Results  Component Value Date   HGBA1C 5.6 01/28/2019   Last GFR Lab Results  Component Value Date   GFRNONAA 78 08/02/2019     Lab Results  Component Value Date   GFRAA 91 08/02/2019   Patient is on Vitamin D supplement.   Lab Results  Component Value Date   VD25OH 66 01/28/2019      Medication Review:     Current Outpatient Medications (Analgesics):  .  ibuprofen (ADVIL,MOTRIN) 200 MG tablet, Take 400 mg by mouth daily as needed for moderate pain.    Current Outpatient Medications (Other):  .  cholecalciferol (VITAMIN D) 1000 UNITS tablet, Take 5,000 Units by mouth daily.  .  Multiple Vitamins-Minerals (ICAPS LUTEIN & OMEGA-3 PO), Take 1 capsule by mouth daily. Marland Kitchen  triamcinolone ointment (KENALOG) 0.5 %, Apply 1 application topically 2 (two) times daily. Apply to hand eczema 2 x /day  Allergies: Allergies  Allergen Reactions  . Crestor [Rosuvastatin Calcium] Other (See Comments)    myalgias  . Red Yeast Rice [Cholestin] Other (See Comments)    Dizzy  . Tape Other (See Comments)    Pt states that skin burns, and it hurts severely.  Tori Milks [Naproxen Sodium] Rash  . Darvon [Propoxyphene] Nausea And Vomiting and Anxiety  . Latex Rash    Contact makes the skin feel "burned"    Current Problems (verified) has History of ductal carcinoma in situ (DCIS) of breast; Hyperlipidemia, mixed; Prediabetes; Vitamin D deficiency; Medication management; Major depression in partial remission ([place]); Lumbago with Right Sciatica; Lumbar canal stenosis; Degenerative spondylolisthesis; Osteopenia; Overweight (BMI 25.0-29.9); Aortic atherosclerosis ([place]); Labile hypertension; Abnormal glucose; and Former smoker on their problem list.  Screening Tests Immunization History  Administered Date(s)  Administered  . Influenza, High Dose Seasonal PF 05/05/2014, 06/15/2015, 06/04/2016, 06/17/2017, 07/15/2018, 06/08/2019  . PFIZER SARS-COV-2 Vaccination 10/05/2019  . PPD Test 10/18/2013  . Pneumococcal Conjugate-13 05/05/2014  . Pneumococcal Polysaccharide-23 10/18/2014  . Tdap 10/18/2014  . Zoster 11/06/2009   Last colonoscopy: 2006 Cologuard: 2016 Last mammogram: Scheduled 02/2019  need reports - [facility] - records requested Last pap smear/pelvic exam: 2018 - OBGYN - never abnormal pap DEXA:2010, 2015, scheduled6/2020 by [facility] - osteopenia without significant change   Prior vaccinations: TD or Tdap: 2016         Influenza: 2018            Pneumococcal: 2016 Prevnar13: 2015 Shingles/Zostavax: 2011 Shingrix: Due, discussed with patient, local pharm Covid-19 : Complete 10/05/19     Names of Other Physician/Practitioners you currently use: 1. [facility] here for primary care 2. Eye Exam , [place] 3. Dentist Dr Mariea Clonts, 11/2019  Patient Care Team: Unk Pinto, MD as PCP - General (Internal Medicine) Ladene Artist, MD as Consulting Physician (Gastroenterology) Rozetta Nunnery, MD as Consulting Physician (Otolaryngology) Susa Day, MD as Consulting Physician (Orthopedic Surgery) Nahser, Wonda Cheng, MD as Consulting Physician (Cardiology)  Surgical: She  has a past  surgical history that includes Tubal ligation; Breast lumpectomy (09/2009); Lumbar disc surgery; Tonsillectomy and adenoidectomy (1960); Cesarean section; Laminectomy (Right, 07/18/2014); and Open reduction internal fixation (orif) distal radial fracture (Left, 09/23/2018). Family Her family history includes Alcohol abuse in her brother; COPD in her father; Heart attack (age of onset: 75) in her brother; Liver disease in her mother; Stroke (age of onset: 54) in her brother. Social history  She reports that she quit smoking about 14 years  ago. She has a 10.00 pack-year smoking history. She has never used smokeless tobacco. She reports that she does not drink alcohol or use drugs.  MEDICARE WELLNESS OBJECTIVES: Physical activity: Current Exercise Habits: Home exercise routine, Type of exercise: walking(Yard and house work), Time (Minutes): 30, Frequency (Times/Week): 4, Weekly Exercise (Minutes/Week): 120, Intensity: Mild Cardiac risk factors: Cardiac Risk Factors include: advanced age (>80men, >39 women) Depression/mood screen:   Depression screen [facility] 2/9 11/25/2019  Decreased Interest 0  Down, Depressed, Hopeless 0  PHQ - 2 Score 0    ADLs:  In your present state of health, do you have any difficulty performing the following activities: 11/25/2019 01/29/2019  Hearing? N N  Vision? N N  Difficulty concentrating or making decisions? N N  Walking or climbing stairs? N N  Dressing or bathing? N N  Doing errands, shopping? N N  Preparing Food and eating ? N -  Using the Toilet? N -  In the past six months, have you accidently leaked urine? N -  Do you have problems with loss of bowel control? N -  Managing your Medications? N -  Managing your Finances? N -  Housekeeping or managing your Housekeeping? N -  Some recent data might be hidden     Cognitive Testing  Alert? Yes  Normal Appearance?Yes  Oriented to person? Yes  Place? Yes   Time? Yes  Recall of three objects?  Yes  Can perform simple calculations? Yes  Displays appropriate judgment?Yes  Can read the correct time from a watch face?Yes  EOL planning: Does Patient Have a Medical Advance Directive?: No Would patient like information on creating a medical advance directive?: No - Patient declined   Objective:   Today's Vitals   11/25/19 0941  BP: 122/68  Pulse: 62  Temp: (!) 97.3 F (36.3 C)  SpO2: 98%  Weight: 151 lb 3.2 oz (68.6 kg)  Height: 5\' 2"  (1.575 m)   Body mass index is 27.65 kg/m.  General : Well sounding patient in no apparent  distress HEENT: no hoarseness, no cough for duration of visit Lungs: speaks in complete sentences, no audible wheezing, no apparent distress Neurological: alert, oriented x 3 Psychiatric: pleasant, judgement appropriate   Medicare Attestation I have personally reviewed: The patient's medical and social history Their use of alcohol, tobacco or illicit drugs Their current medications and supplements The patient's functional ability including ADLs,fall risks, home safety risks, cognitive, and hearing and visual impairment Diet and physical activities Evidence for depression or mood disorders  The patient's weight, height, BMI, and visual acuity have been recorded in the chart.  I have made referrals, counseling, and provided education to the patient based on review of the above and I have provided the patient with a written personalized care plan for preventive services.     Garnet Sierras, NP [facility] 12/02/2019  4:21 PM

## 2019-11-26 LAB — LIPID PANEL
Cholesterol: 242 mg/dL — ABNORMAL HIGH
HDL: 69 mg/dL
LDL Cholesterol (Calc): 153 mg/dL — ABNORMAL HIGH
Non-HDL Cholesterol (Calc): 173 mg/dL — ABNORMAL HIGH
Total CHOL/HDL Ratio: 3.5 (calc)
Triglycerides: 92 mg/dL

## 2019-11-26 LAB — COMPLETE METABOLIC PANEL WITH GFR
AG Ratio: 2 (calc) (ref 1.0–2.5)
ALT: 11 U/L (ref 6–29)
AST: 14 U/L (ref 10–35)
Albumin: 4 g/dL (ref 3.6–5.1)
Alkaline phosphatase (APISO): 76 U/L (ref 37–153)
BUN: 10 mg/dL (ref 7–25)
CO2: 27 mmol/L (ref 20–32)
Calcium: 9.2 mg/dL (ref 8.6–10.4)
Chloride: 106 mmol/L (ref 98–110)
Creat: 0.73 mg/dL (ref 0.60–0.93)
GFR, Est African American: 97 mL/min/{1.73_m2} (ref >=60)
GFR, Est Non African American: 83 mL/min/{1.73_m2} (ref >=60)
Globulin: 2 g/dL (calc) (ref 1.9–3.7)
Glucose, Bld: 100 mg/dL — ABNORMAL HIGH (ref 65–99)
Potassium: 4.1 mmol/L (ref 3.5–5.3)
Sodium: 140 mmol/L (ref 135–146)
Total Bilirubin: 0.5 mg/dL (ref 0.2–1.2)
Total Protein: 6 g/dL — ABNORMAL LOW (ref 6.1–8.1)

## 2019-11-26 LAB — CBC WITH DIFFERENTIAL/PLATELET
Absolute Monocytes: 345 cells/uL (ref 200–950)
Basophils Absolute: 30 cells/uL (ref 0–200)
Basophils Relative: 0.6 %
Eosinophils Absolute: 40 cells/uL (ref 15–500)
Eosinophils Relative: 0.8 %
HCT: 42.6 % (ref 35.0–45.0)
Hemoglobin: 13.8 g/dL (ref 11.7–15.5)
Lymphs Abs: 1810 cells/uL (ref 850–3900)
MCH: 29 pg (ref 27.0–33.0)
MCHC: 32.4 g/dL (ref 32.0–36.0)
MCV: 89.5 fL (ref 80.0–100.0)
MPV: 11.5 fL (ref 7.5–12.5)
Monocytes Relative: 6.9 %
Neutro Abs: 2775 cells/uL (ref 1500–7800)
Neutrophils Relative %: 55.5 %
Platelets: 168 10*3/uL (ref 140–400)
RBC: 4.76 10*6/uL (ref 3.80–5.10)
RDW: 12.8 % (ref 11.0–15.0)
Total Lymphocyte: 36.2 %
WBC: 5 10*3/uL (ref 3.8–10.8)

## 2019-11-26 LAB — HEMOGLOBIN A1C
Hgb A1c MFr Bld: 5.6 %{Hb}
Mean Plasma Glucose: 114 (calc)
eAG (mmol/L): 6.3 (calc)

## 2019-11-26 NOTE — Progress Notes (Signed)
Please contact patient with lab results:   -Blood count is in normal range  -Electrolytes, kidney and liver function are in normal range.  -Protein is little low.  Be sure you are getting protein in your diet by means of lean chicken, fish, beans.  -Your cholesterol, LDL (bad) cholesterol increased from 145 to 153.  Recommendation remains same start Nexlatol and we will follow up in two weeks (appointment already scheduled 4/15).  Please contact office with any side effects.  -A1c, looking at glucose in your blood over the past three months looks good at 5.6.  Sincerely,        Garnet Sierras, NP

## 2019-12-15 NOTE — Progress Notes (Deleted)
Assessment and Plan:  Diagnoses and all orders for this visit:  Hyperlipidemia, mixed  Aortic atherosclerosis ([place])  Labile hypertension       Further disposition pending results of labs. Discussed med's effects and SE's.   Over 30 minutes of exam, counseling, chart review, and critical decision making was performed.   Future Appointments  Date Time Provider [place]  12/16/2019 11:00 AM Garnet Sierras, NP [facility] None  02/23/2020 10:00 AM Unk Pinto, MD [facility] None    ------------------------------------------------------------------------------------------------------------------   HPI 69 y.o.female presents for follow up after two weeks of starting Nextletol for LDL of 153 on 11/25/19.  She has had adverse reactions to atorvastatin, rosuvastatin, zetia and has declined welchol and referral to lipid clinic in past.  She has been on fiber supplements and diet modification to help with this but remains not at goal.    ASCVD 8.8%  Since starting Nextletol she reports ***  She was on bASA in 2018.  This was discontinued related to ****   Past Medical History:  Diagnosis Date  . Anxiety   . DCIS (ductal carcinoma in situ) of breast 04/29/2012  . HTN (hypertension)   . Hyperlipidemia   . Pre-diabetes   . Vitamin D deficiency      Allergies  Allergen Reactions  . Crestor [Rosuvastatin Calcium] Other (See Comments)    myalgias  . Red Yeast Rice [Cholestin] Other (See Comments)    Dizzy  . Tape Other (See Comments)    Pt states that skin burns, and it hurts severely.  Tori Milks [Naproxen Sodium] Rash  . Darvon [Propoxyphene] Nausea And Vomiting and Anxiety  . Latex Rash    Contact makes the skin feel "burned"    Current Outpatient Medications on File Prior to Visit  Medication Sig  . cholecalciferol (VITAMIN D) 1000 UNITS tablet Take 5,000 Units by mouth daily.   Marland Kitchen ibuprofen (ADVIL,MOTRIN) 200 MG tablet Take 400 mg by mouth daily as needed  for moderate pain.   . Multiple Vitamins-Minerals (ICAPS LUTEIN & OMEGA-3 PO) Take 1 capsule by mouth daily.  Marland Kitchen triamcinolone ointment (KENALOG) 0.5 % Apply 1 application topically 2 (two) times daily. Apply to hand eczema 2 x /day   No current facility-administered medications on file prior to visit.    ROS: all negative except above.   Physical Exam:  There were no vitals taken for this visit.  General Appearance: Well nourished, in no apparent distress. Eyes: PERRLA, EOMs, conjunctiva no swelling or erythema Sinuses: No Frontal/maxillary tenderness ENT/Mouth: Ext aud canals clear, TMs without erythema, bulging. No erythema, swelling, or exudate on post pharynx.  Tonsils not swollen or erythematous. Hearing normal.  Neck: Supple, thyroid normal.  Respiratory: Respiratory effort normal, BS equal bilaterally without rales, rhonchi, wheezing or stridor.  Cardio: RRR with no MRGs. Brisk peripheral pulses without edema.  Abdomen: Soft, + BS.  Non tender, no guarding, rebound, hernias, masses. Lymphatics: Non tender without lymphadenopathy.  Musculoskeletal: Full ROM, 5/5 strength, normal gait.  Skin: Warm, dry without rashes, lesions, ecchymosis.  Neuro: Cranial nerves intact. Normal muscle tone, no cerebellar symptoms. Sensation intact.  Psych: Awake and oriented X 3, normal affect, Insight and Judgment appropriate.     Garnet Sierras, NP 3:31 PM [facility]

## 2019-12-16 ENCOUNTER — Ambulatory Visit: Payer: Medicare Other | Admitting: Adult Health Nurse Practitioner

## 2019-12-28 ENCOUNTER — Ambulatory Visit (INDEPENDENT_AMBULATORY_CARE_PROVIDER_SITE_OTHER): Payer: Medicare Other | Admitting: Adult Health Nurse Practitioner

## 2019-12-28 ENCOUNTER — Encounter: Payer: Self-pay | Admitting: Adult Health Nurse Practitioner

## 2019-12-28 ENCOUNTER — Other Ambulatory Visit: Payer: Self-pay

## 2019-12-28 VITALS — BP 122/70 | HR 64 | Temp 96.6°F | Ht 62.0 in | Wt 148.2 lb

## 2019-12-28 DIAGNOSIS — R0989 Other specified symptoms and signs involving the circulatory and respiratory systems: Secondary | ICD-10-CM

## 2019-12-28 DIAGNOSIS — E782 Mixed hyperlipidemia: Secondary | ICD-10-CM

## 2019-12-28 DIAGNOSIS — Z79899 Other long term (current) drug therapy: Secondary | ICD-10-CM | POA: Diagnosis not present

## 2019-12-28 MED ORDER — NEXLETOL 180 MG PO TABS: 180.0000 mg | ORAL_TABLET | Freq: Every day | ORAL | 3 refills | Status: DC

## 2019-12-28 NOTE — Patient Instructions (Signed)
   Start taking B12 sublingual supplement daily.  This can help with mental clarity and energy.   Contact insurance for information about hearing aid coverage.   Concerned part of memory is related to hearing.  We will send in a prescription for the Nexletol.  Take one tablet daily.  Please let me know if this medication is too high!  We can work with you to get this at a more affordable cost.

## 2019-12-28 NOTE — Progress Notes (Signed)
FOLLOW UP MEDICATION START   Assessment and Plan:  Arzu was seen today for follow-up.  Diagnoses and all orders for this visit:  Hyperlipidemia, mixed -     Bempedoic Acid (NEXLETOL) 180 MG TABS; Take 180 mg by mouth daily. Discussed medication with patient Contact insurance for coverage. Contact office with any complications obtaining medicaiton Labile hypertension Monitor blood pressure at home; call if consistently over 130/80 Continue DASH diet.   Reminder to go to the ER if any CP, SOB, nausea, dizziness, severe HA, changes vision/speech, left arm numbness and tingling and jaw pain.  Medication management Continued    Further disposition pending results of labs. Discussed med's effects and SE's.   Over 30 minutes of face to face interview exam, counseling, chart review, and critical decision making was performed.   Future Appointments  Date Time Provider [place]  04/03/2020  2:00 PM Unk Pinto, MD [facility] None    ------------------------------------------------------------------------------------------------------------------   HPI 69 y.o.female presents for follow up on new medication start, Nextletol. She tolerated the medicaiton and denies any side effetcs.   She has tried multiple statins, zetia and fenofibrates with adverse reactions with myalgias and unable to tolerate this treatment.  Her last LDL is 153 and total 242.  She has ASCVD 8.7% risk.  Family history of cardiac death of brother before age of 68.  Mother stroke at age 62.  Will check labs in three months to support benefit of treatment to decrease cardiac risk factors.  Contact about insurance about hearing aids/ to get fitted and tested.      Tobacco Use: Medium Risk  . Smoking Tobacco Use: Former Smoker  . Smokeless Tobacco Use: Never Used   get tested and fitted  Past Medical History:  Diagnosis Date  . Anxiety   . DCIS (ductal carcinoma in situ) of breast 04/29/2012  .  HTN (hypertension)   . Hyperlipidemia   . Pre-diabetes   . Vitamin D deficiency      Allergies  Allergen Reactions  . Crestor [Rosuvastatin Calcium] Other (See Comments)    myalgias  . Red Yeast Rice [Cholestin] Other (See Comments)    Dizzy  . Tape Other (See Comments)    Pt states that skin burns, and it hurts severely.  Tori Milks [Naproxen Sodium] Rash  . Darvon [Propoxyphene] Nausea And Vomiting and Anxiety  . Latex Rash    Contact makes the skin feel "burned"    Current Outpatient Medications on File Prior to Visit  Medication Sig  . cholecalciferol (VITAMIN D) 1000 UNITS tablet Take 5,000 Units by mouth daily.   Marland Kitchen ibuprofen (ADVIL,MOTRIN) 200 MG tablet Take 400 mg by mouth daily as needed for moderate pain.   . Multiple Vitamin (MULTIVITAMIN ADULT PO) Take by mouth daily.  . Multiple Vitamins-Minerals (ICAPS LUTEIN & OMEGA-3 PO) Take 1 capsule by mouth daily.  Marland Kitchen triamcinolone ointment (KENALOG) 0.5 % Apply 1 application topically 2 (two) times daily. Apply to hand eczema 2 x /day   No current facility-administered medications on file prior to visit.    ROS: all negative except above.   Physical Exam:  BP 122/70   Pulse 64   Temp (!) 96.6 F (35.9 C)   Ht 5\' 2"  (1.575 m)   Wt 148 lb 3.2 oz (67.2 kg)   SpO2 98%   BMI 27.11 kg/m   General Appearance: Well nourished, in no apparent distress. Eyes: PERRLA, EOMs, conjunctiva no swelling or erythema Sinuses: No Frontal/maxillary tenderness ENT/Mouth: Ext  aud canals clear, TMs without erythema, bulging. No erythema, swelling, or exudate on post pharynx.  Tonsils not swollen or erythematous. Hearing normal.  Neck: Supple, thyroid normal.  Respiratory: Respiratory effort normal, BS equal bilaterally without rales, rhonchi, wheezing or stridor.  Cardio: RRR with no MRGs. Brisk peripheral pulses without edema.  Abdomen: Soft, + BS.  Non tender, no guarding, rebound, hernias, masses. Lymphatics: Non tender without  lymphadenopathy.  Musculoskeletal: Full ROM, 5/5 strength, normal gait.  Skin: Warm, dry without rashes, lesions, ecchymosis.  Neuro: Cranial nerves intact. Normal muscle tone, no cerebellar symptoms. Sensation intact.  Psych: Awake and oriented X 3, normal affect, Insight and Judgment appropriate.     Garnet Sierras, NP 1:19 PM [facility]

## 2020-01-14 ENCOUNTER — Other Ambulatory Visit: Payer: Self-pay | Admitting: Internal Medicine

## 2020-01-14 DIAGNOSIS — E782 Mixed hyperlipidemia: Secondary | ICD-10-CM

## 2020-01-14 MED ORDER — NEXLETOL 180 MG PO TABS: ORAL_TABLET | ORAL | 0 refills | Status: DC

## 2020-01-17 ENCOUNTER — Other Ambulatory Visit: Payer: Self-pay

## 2020-01-17 DIAGNOSIS — E782 Mixed hyperlipidemia: Secondary | ICD-10-CM

## 2020-01-17 MED ORDER — NEXLETOL 180 MG PO TABS: ORAL_TABLET | ORAL | 3 refills | Status: DC

## 2020-01-18 ENCOUNTER — Telehealth: Payer: Self-pay

## 2020-01-19 NOTE — Telephone Encounter (Signed)
Pt called with some questions about a Prior Auth question, which were addressed & pt agreed & voiced understanding

## 2020-01-28 ENCOUNTER — Ambulatory Visit: Payer: Medicare Other | Admitting: Adult Health Nurse Practitioner

## 2020-02-14 IMAGING — CR DG FOREARM 2V*L*
2 series · 2 of 2 positions shown · non-contrast
Comparison: None.

CLINICAL DATA: Status post fall today with pain in the left hand
and wrist.

EXAM:
LEFT FOREARM - 2 VIEW

[x forearm ap left]
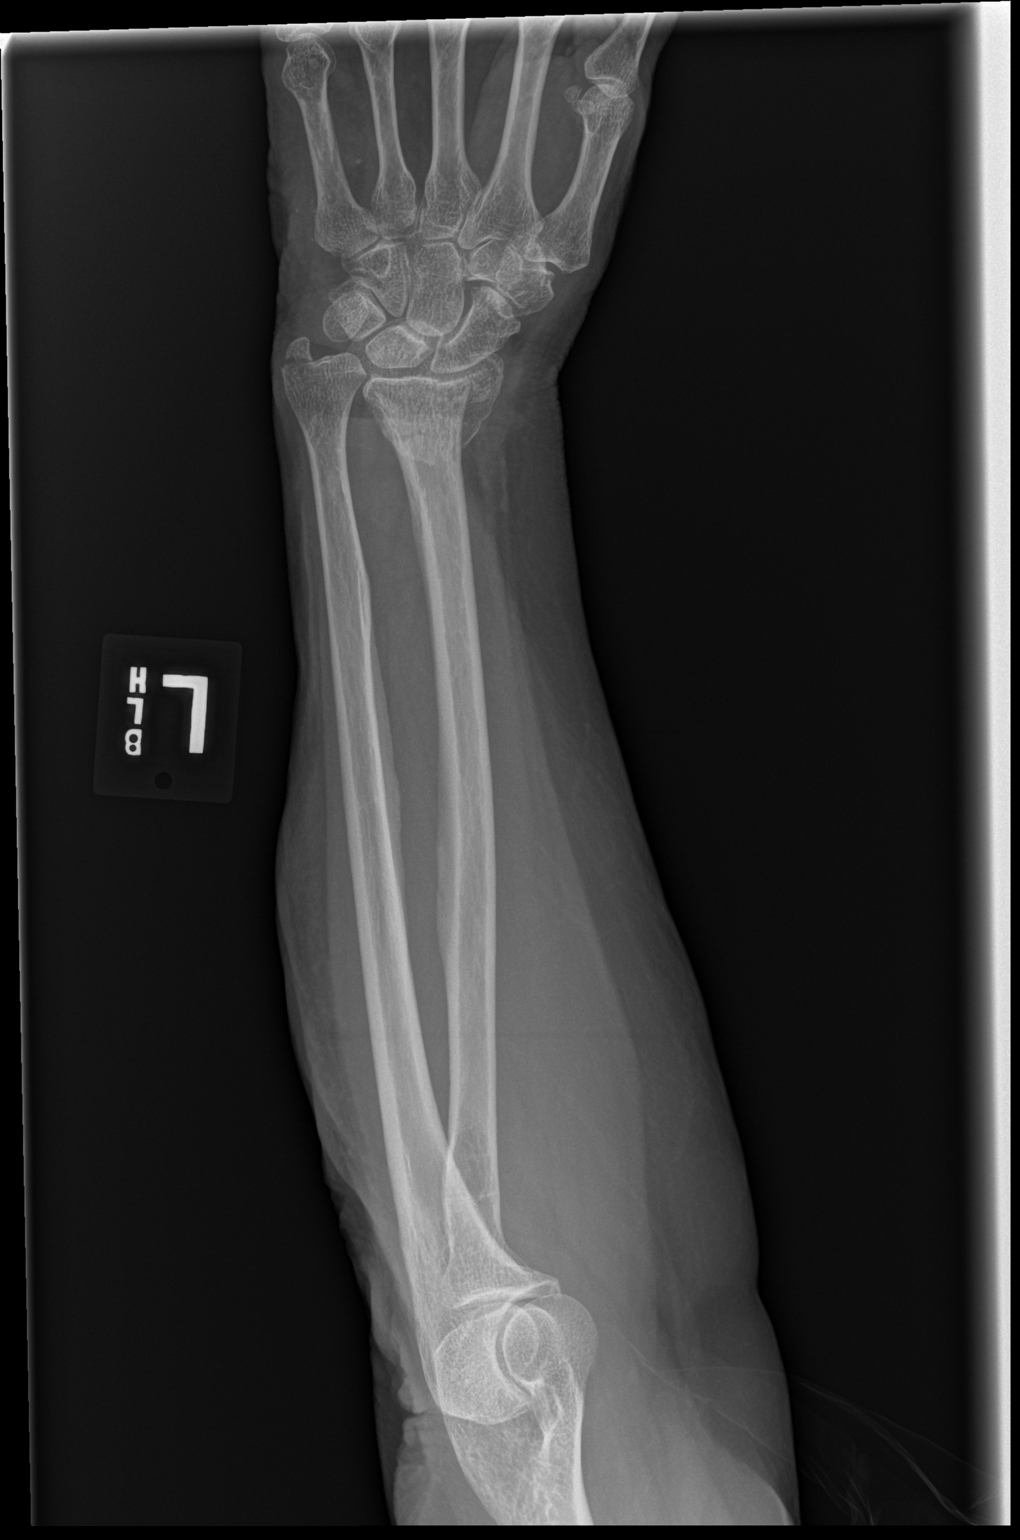

[x forearm lat left]
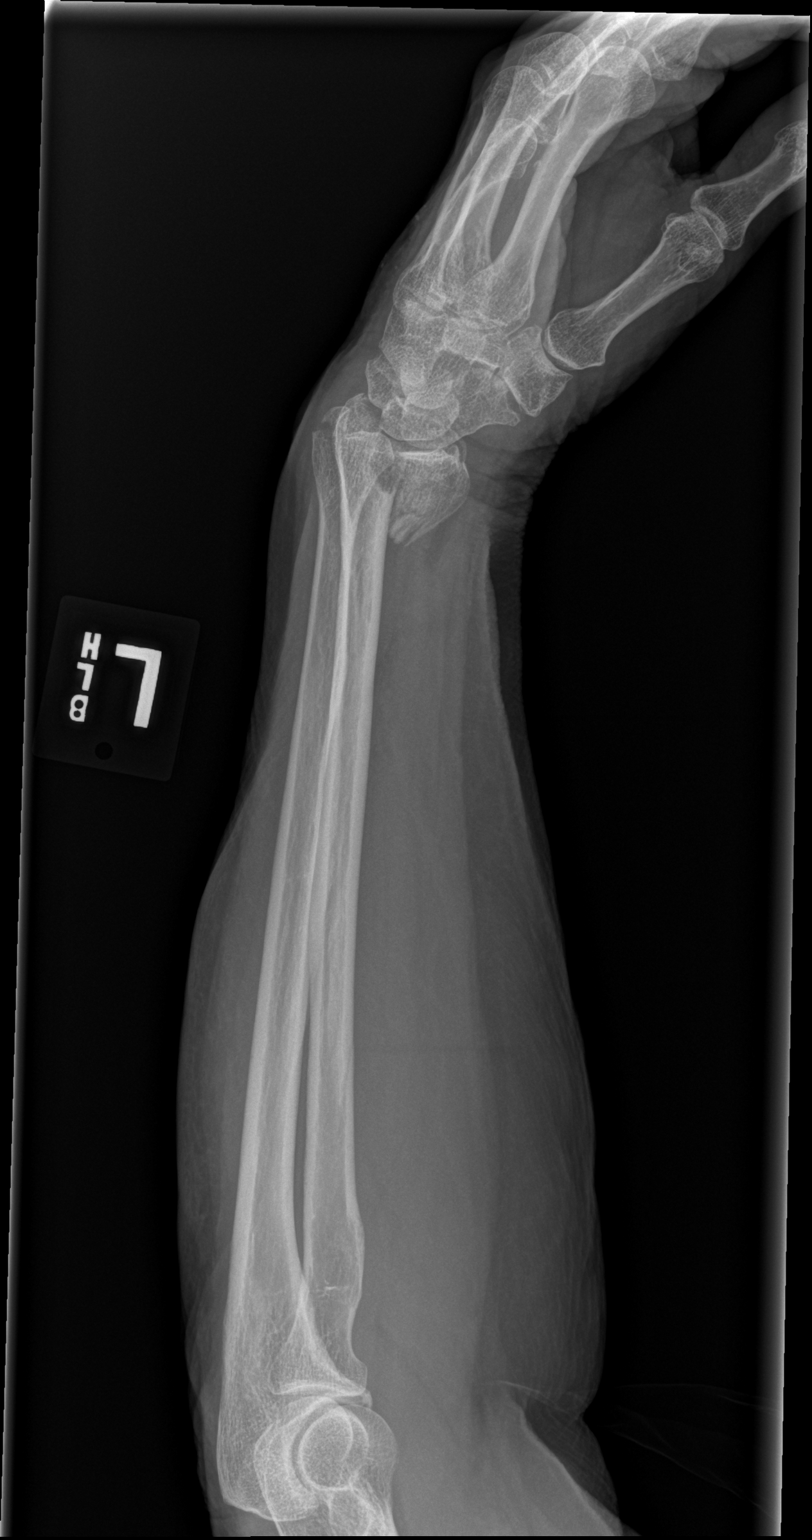

[2 of 2 positions shown; findings below may reference images not displayed]

FINDINGS: Comminuted displaced intra-articular fracture of distal radius is
identified. There is fracture of the ulna styloid.
IMPRESSION: Fracture of distal ulna and radius.

## 2020-02-14 IMAGING — CR DG WRIST COMPLETE 3+V*L*
4 series · 4 of 4 positions shown · non-contrast
Comparison: None.

CLINICAL DATA: Status post fall with left wrist pain.

EXAM:
LEFT WRIST - COMPLETE 3+ VIEW

[x wrist pa left]
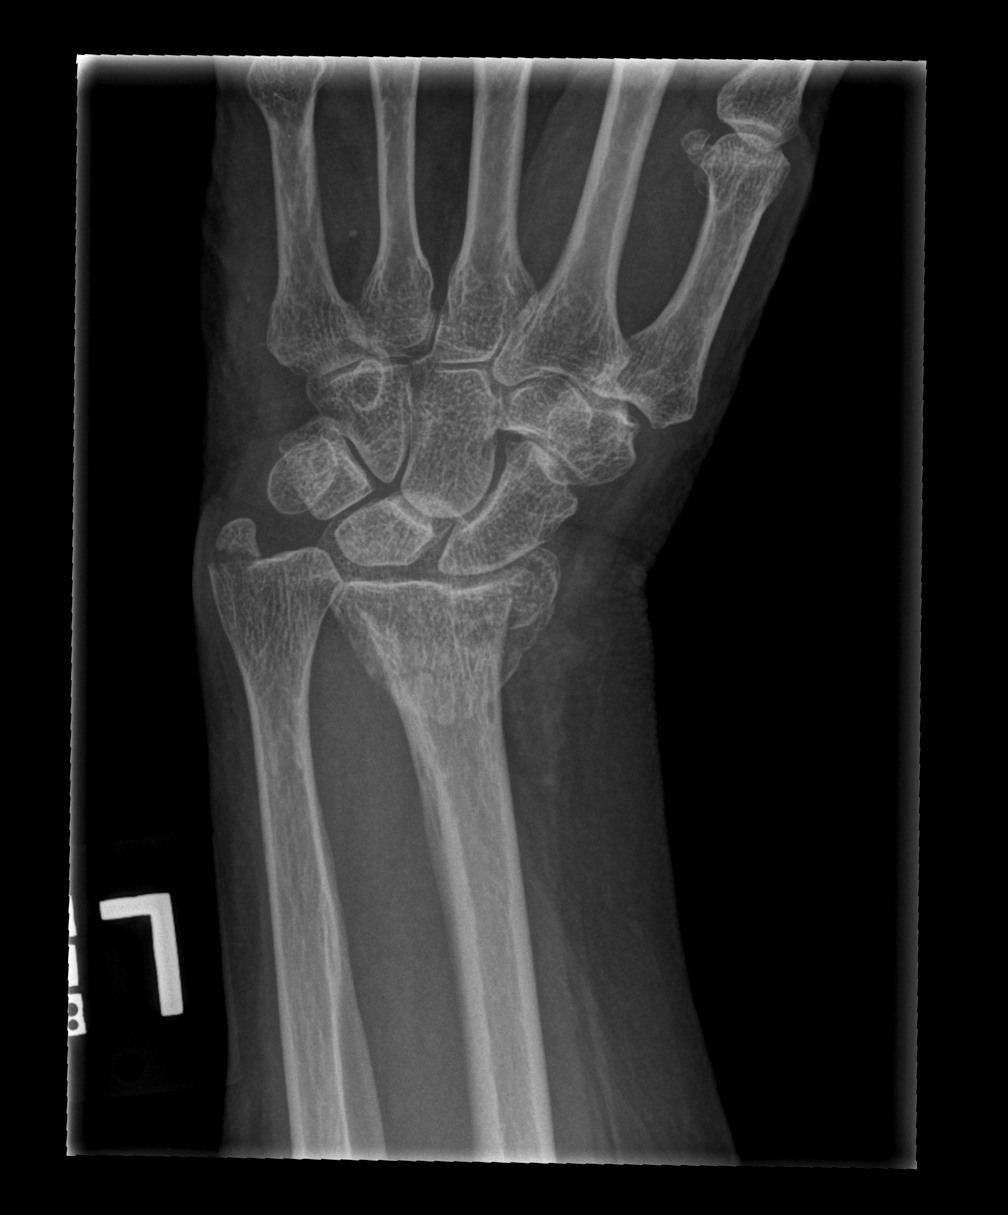

[x wrist obl left]
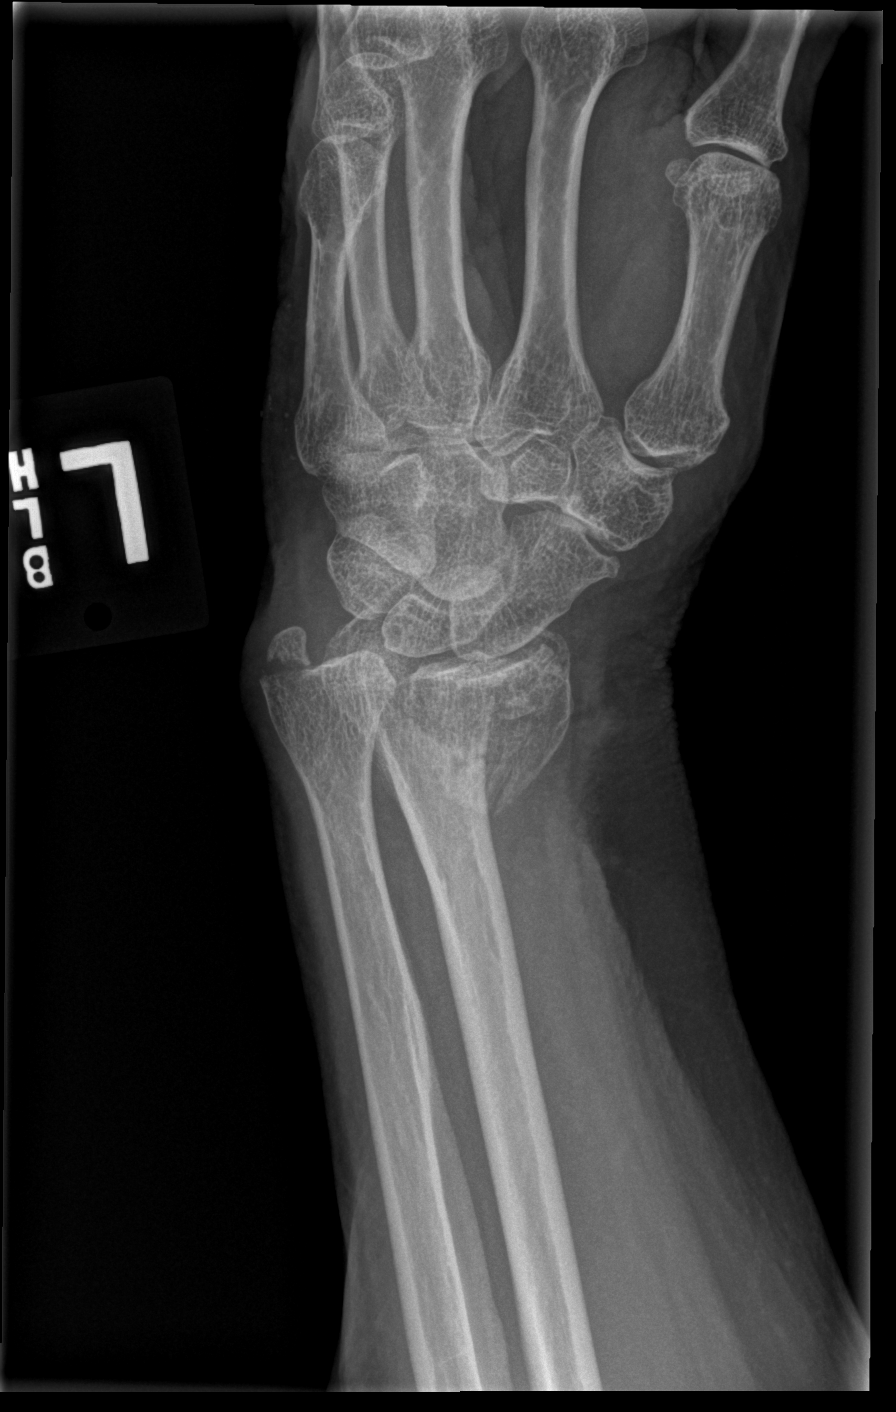

[x wrist lat left]
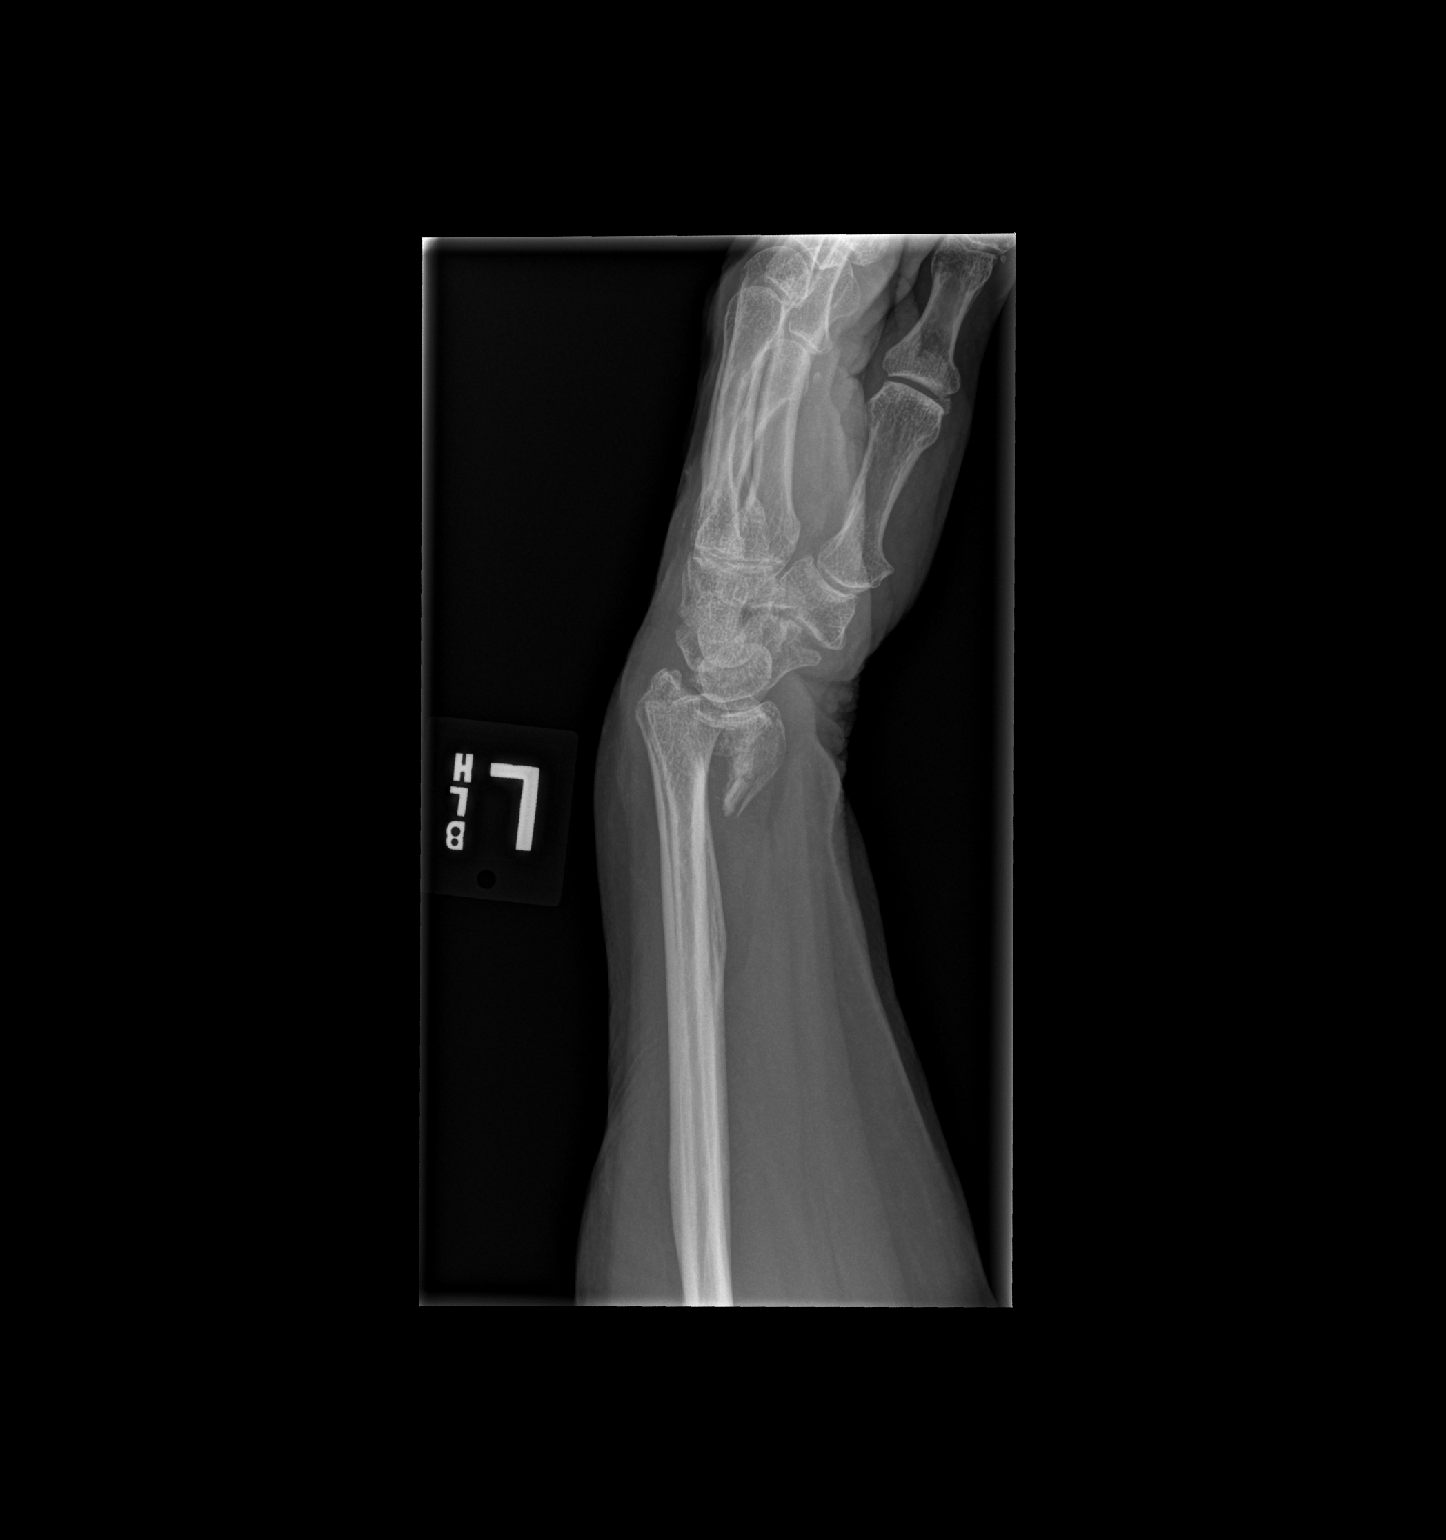

[x wrist navicular view left]
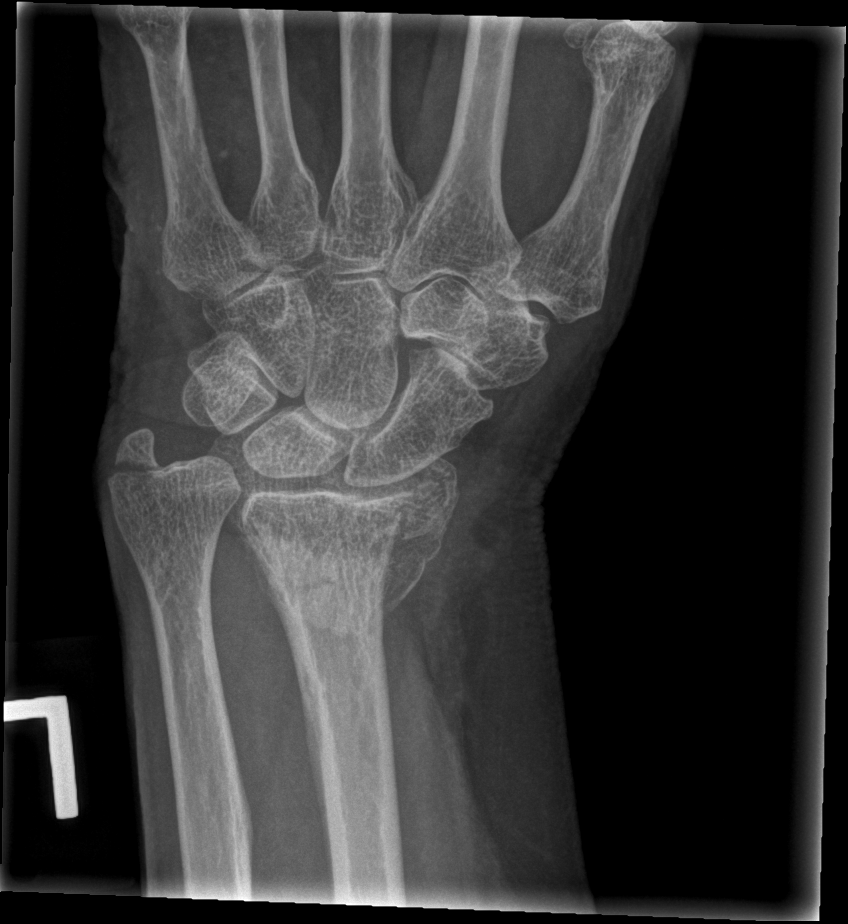

[4 of 4 positions shown; findings below may reference images not displayed]

FINDINGS: Comminuted intra-articular displaced fracture of distal radius is
noted. There is fracture of the ulna styloid.
IMPRESSION: Fractures of distal ulna and radius.

## 2020-02-23 ENCOUNTER — Encounter: Payer: Medicare Other | Admitting: Internal Medicine

## 2020-03-08 ENCOUNTER — Encounter: Payer: Medicare Other | Admitting: Internal Medicine

## 2020-04-03 ENCOUNTER — Encounter: Payer: Medicare Other | Admitting: Internal Medicine

## 2020-04-04 LAB — HM MAMMOGRAPHY

## 2020-04-05 ENCOUNTER — Encounter: Payer: Self-pay | Admitting: *Deleted

## 2020-04-06 ENCOUNTER — Encounter: Payer: Self-pay | Admitting: Internal Medicine

## 2020-04-06 ENCOUNTER — Other Ambulatory Visit: Payer: Self-pay

## 2020-04-06 ENCOUNTER — Ambulatory Visit (INDEPENDENT_AMBULATORY_CARE_PROVIDER_SITE_OTHER): Payer: Medicare Other | Admitting: Internal Medicine

## 2020-04-06 VITALS — BP 124/62 | HR 84 | Temp 97.2°F | Resp 16 | Ht 62.5 in | Wt 148.8 lb

## 2020-04-06 DIAGNOSIS — Z0000 Encounter for general adult medical examination without abnormal findings: Secondary | ICD-10-CM | POA: Diagnosis not present

## 2020-04-06 DIAGNOSIS — Z136 Encounter for screening for cardiovascular disorders: Secondary | ICD-10-CM | POA: Diagnosis not present

## 2020-04-06 DIAGNOSIS — I10 Essential (primary) hypertension: Secondary | ICD-10-CM | POA: Diagnosis not present

## 2020-04-06 DIAGNOSIS — Z0001 Encounter for general adult medical examination with abnormal findings: Secondary | ICD-10-CM

## 2020-04-06 DIAGNOSIS — Z8249 Family history of ischemic heart disease and other diseases of the circulatory system: Secondary | ICD-10-CM | POA: Diagnosis not present

## 2020-04-06 DIAGNOSIS — Z87891 Personal history of nicotine dependence: Secondary | ICD-10-CM

## 2020-04-06 DIAGNOSIS — Z1211 Encounter for screening for malignant neoplasm of colon: Secondary | ICD-10-CM

## 2020-04-06 DIAGNOSIS — E782 Mixed hyperlipidemia: Secondary | ICD-10-CM

## 2020-04-06 DIAGNOSIS — Z79899 Other long term (current) drug therapy: Secondary | ICD-10-CM

## 2020-04-06 DIAGNOSIS — R7309 Other abnormal glucose: Secondary | ICD-10-CM

## 2020-04-06 DIAGNOSIS — E559 Vitamin D deficiency, unspecified: Secondary | ICD-10-CM

## 2020-04-06 NOTE — Patient Instructions (Signed)

## 2020-04-06 NOTE — Progress Notes (Signed)
Annual Screening/Preventative Visit & Comprehensive Evaluation &  Examination     This very nice 69 y.o.  WWF presents for a Screening /Preventative Visit & comprehensive evaluation and management of multiple medical co-morbidities.  Patient has been followed for HTN, HLD, Prediabetes  and Vitamin D Deficiency. Hx Osteopenia by de[facility]MD in 2018.      In 2011, patientwas treated for CIS of theLt Breast by lumpectomy andRadiation.       HTN predates circa 2009. Patient's BP has been controlled at home and patient denies any cardiac symptoms as chest pain, palpitations, shortness of breath, dizziness or ankle swelling. Today's BP is at goal - 124/62.      Patient is intolerant to Statins & Zetia  and her hyperlipidemia is not controlled with diet. Last lipids were not at goal:  Lab Results  Component Value Date   CHOL 242 (H) 11/25/2019   HDL 69 11/25/2019   LDLCALC 153 (H) 11/25/2019   LDLDIRECT 139.2 04/29/2012   TRIG 92 11/25/2019   CHOLHDL 3.5 11/25/2019       Patient has hx/o prediabetes (A1c 6.3%/ 2008 and 5.9% / 2016)  and patient denies reactive hypoglycemic symptoms, visual blurring, diabetic polys or paresthesias. Last A1c was Normal & at goal:  Lab Results  Component Value Date   HGBA1C 5.6 11/25/2019       Finally, patient has history of Vitamin D Deficiency ("24" / 2008)  and last Vitamin D was at goal:  Lab Results  Component Value Date   VD25OH 66 01/28/2019    Current Outpatient Medications on File Prior to Visit  Medication Sig  . cholecalciferol (VITAMIN D) 1000 UNITS tablet Take 5,000 Units by mouth daily.   Marland Kitchen ibuprofen (ADVIL,MOTRIN) 200 MG tablet Take 400 mg by mouth daily as needed for moderate pain.   . Multiple Vitamin (MULTIVITAMIN ADULT PO) Take by mouth daily.  . Multiple Vitamins-Minerals (ICAPS LUTEIN & OMEGA-3 PO) Take 1 capsule by mouth daily.  Marland Kitchen triamcinolone ointment (KENALOG) 0.5 % Apply 1 application topically 2 (two) times daily. Apply  to hand eczema 2 x /day  . Bempedoic Acid (NEXLETOL) 180 MG TABS Take 1 tablet Daily for Cholesterol (Patient not taking: Reported on 04/06/2020)   No current facility-administered medications on file prior to visit.   Allergies  Allergen Reactions  . Crestor [Rosuvastatin Calcium] Other (See Comments)    myalgias  . Red Yeast Rice [Cholestin] Other (See Comments)    Dizzy  . Tape Other (See Comments)    Pt states that skin burns, and it hurts severely.  Tori Milks [Naproxen Sodium] Rash  . Darvon [Propoxyphene] Nausea And Vomiting and Anxiety  . Latex Rash    Contact makes the skin feel "burned"   Past Medical History:  Diagnosis Date  . Anxiety   . DCIS (ductal carcinoma in situ) of breast 04/29/2012  . HTN (hypertension)   . Hyperlipidemia   . Pre-diabetes   . Vitamin D deficiency    Health Maintenance  Topic Date Due  . COVID-19 Vaccine (2 - Pfizer 2-dose series) 10/26/2019  . INFLUENZA VACCINE  04/02/2020  . Fecal DNA (Cologuard)  01/15/2021  . MAMMOGRAM  04/04/2021  . TETANUS/TDAP  10/18/2024  . DEXA SCAN  Completed  . Hepatitis C Screening  Completed  . PNA vac Low Risk Adult  Completed   Immunization History  Administered Date(s) Administered  . Influenza, High Dose Seasonal PF 05/05/2014, 06/15/2015, 06/04/2016, 06/17/2017, 07/15/2018, 06/08/2019  . PFIZER SARS-COV-2 Vaccination  10/05/2019  . PPD Test 10/18/2013  . Pneumococcal Conjugate-13 05/05/2014  . Pneumococcal Polysaccharide-23 10/18/2014  . Tdap 10/18/2014  . Zoster 11/06/2009    Last Colon - 06/23/2005 - Dr Fuller Plan - 24 yr f/u recc - due 2016 - patient declined   Last MGM - 04/04/2020  Past Surgical History:  Procedure Laterality Date  . BREAST LUMPECTOMY  09/2009   [place]    . LAMINECTOMY Right 07/18/2014   Procedure: Right Lumbar Four to Five Laminectomy for Synovial cyst;  Surgeon: Kristeen Miss, MD;  Location: [place] [facility];  Service: Neurosurgery;  Laterality: Right;   Right L4-5 Laminectomy for Synovial cyst  . LUMBAR [facility]  . OPEN REDUCTION INTERNAL FIXATION (ORIF) DISTAL RADIAL FRACTURE Left 09/23/2018   Procedure: OPEN REDUCTION INTERNAL FIXATION (ORIF) DISTAL RADIAL FRACTURE;  Surgeon: Iran Planas, MD;  Location: [place];  Service: Orthopedics;  Laterality: Left;  . TONSILLECTOMY AND ADENOIDECTOMY  1960  . TUBAL LIGATION     Family History  Problem Relation Age of Onset  . Liver disease Mother        d/c at 81, acute liver failure  . COPD Father        d/c at 23  . Heart attack Brother 7  . Stroke Brother 106       same brother  . Alcohol abuse Brother        other brother   Social History   Tobacco Use  . Smoking status: Former Smoker    Packs/day: 1.00    Years: 10.00    Pack years: 10.00    Quit date: 09/02/2005    Years since quitting: 14.6  . Smokeless tobacco: Never Used  Vaping Use  . Vaping Use: Never used  Substance Use Topics  . Alcohol use: No  . Drug use: No    ROS Constitutional: Denies fever, chills, weight loss/gain, headaches, insomnia,  night sweats, and change in appetite. Does c/o fatigue. Eyes: Denies redness, blurred vision, diplopia, discharge, itchy, watery eyes.  ENT: Denies discharge, congestion, post nasal drip, epistaxis, sore throat, earache, hearing loss, dental pain, Tinnitus, Vertigo, Sinus pain, snoring.  Cardio: Denies chest pain, palpitations, irregular heartbeat, syncope, dyspnea, diaphoresis, orthopnea, PND, claudication, edema Respiratory: denies cough, dyspnea, DOE, pleurisy, hoarseness, laryngitis, wheezing.  Gastrointestinal: Denies dysphagia, heartburn, reflux, water brash, pain, cramps, nausea, vomiting, bloating, diarrhea, constipation, hematemesis, melena, hematochezia, jaundice, hemorrhoids Genitourinary: Denies dysuria, frequency, urgency, nocturia, hesitancy, discharge, hematuria, flank pain Breast: Breast lumps, nipple discharge, bleeding.  Musculoskeletal:  Denies arthralgia, myalgia, stiffness, Jt. Swelling, pain, limp, and strain/sprain. Denies falls. Skin: Denies puritis, rash, hives, warts, acne, eczema, changing in skin lesion Neuro: No weakness, tremor, incoordination, spasms, paresthesia, pain Psychiatric: Denies confusion, memory loss, sensory loss. Denies Depression. Endocrine: Denies change in weight, skin, hair change, nocturia, and paresthesia, diabetic polys, visual blurring, hyper / hypo glycemic episodes.  Heme/Lymph: No excessive bleeding, bruising, enlarged lymph nodes.  Physical Exam  BP 124/62   Pulse 84   Temp (!) 97.2 F (36.2 C)   Resp 16   Ht 5' 2.5" (1.588 m)   Wt 148 lb 12.8 oz (67.5 kg)   BMI 26.78 kg/m   General Appearance: Well nourished, well groomed and in no apparent distress.  Eyes: PERRLA, EOMs, conjunctiva no swelling or erythema, normal fundi and vessels. Sinuses: No frontal/maxillary tenderness ENT/Mouth: EACs patent / TMs  nl. Nares clear without erythema, swelling, mucoid exudates. Oral  hygiene is good. No erythema, swelling, or exudate. Tongue normal, non-obstructing. Tonsils not swollen or erythematous. Hearing normal.  Neck: Supple, thyroid not palpable. No bruits, nodes or JVD. Respiratory: Respiratory effort normal.  BS equal and clear bilateral without rales, rhonci, wheezing or stridor. Cardio: Heart sounds are normal with regular rate and rhythm and no murmurs, rubs or gallops. Peripheral pulses are normal and equal bilaterally without edema. No aortic or femoral bruits. Chest: symmetric with normal excursions and percussion. Breasts: Symmetric, without lumps, nipple discharge, retractions, or fibrocystic changes.  Abdomen: Flat, soft with bowel sounds active. Nontender, no guarding, rebound, hernias, masses, or organomegaly.  Lymphatics: Non tender without lymphadenopathy.  Musculoskeletal: Full ROM all peripheral extremities, joint stability, 5/5 strength, and normal gait. Skin: Warm and  dry without rashes, lesions, cyanosis, clubbing or  ecchymosis.  Neuro: Cranial nerves intact, reflexes equal bilaterally. Normal muscle tone, no cerebellar symptoms. Sensation intact.  Pysch: Alert and oriented X 3, normal affect, Insight and Judgment appropriate.   Assessment and Plan  1. Annual Preventative Screening Examination  2. Essential hypertension  - EKG 12-Lead - Urinalysis, Routine w reflex microscopic - Microalbumin / creatinine urine ratio - CBC with Differential/Platelet - COMPLETE METABOLIC PANEL WITH GFR - Magnesium - TSH  3. Hyperlipidemia, mixed  - EKG 12-Lead - Lipid panel - TSH  4. Abnormal glucose  - EKG 12-Lead - Hemoglobin A1c - Insulin, random  5. Vitamin D deficiency  - VITAMIN D 25 Hydroxy  6. Screening for colorectal cancer  - POC Hemoccult Bld/Stl   7. Screening for ischemic heart disease  - EKG 12-Lead  8. FHx: heart disease  - EKG 12-Lead  9. Former smoker  - EKG 12-Lead  10. Medication management  - Urinalysis, Routine w reflex microscopic - Microalbumin / creatinine urine ratio - CBC with Differential/Platelet - COMPLETE METABOLIC PANEL WITH GFR - Magnesium - Lipid panel - TSH - Hemoglobin A1c - Insulin, random - VITAMIN D 25 Hydroxy         Patient was counseled in prudent diet to achieve/maintain BMI less than 25 for weight control, BP monitoring, regular exercise and medications. Discussed med's effects and SE's. Screening labs and tests as requested with regular follow-up as recommended. Over 40 minutes of exam, counseling, chart review and high complex critical decision making was performed.   Kirtland Bouchard, MD

## 2020-04-07 LAB — COMPLETE METABOLIC PANEL WITH GFR
AG Ratio: 1.8 (calc) (ref 1.0–2.5)
ALT: 8 U/L (ref 6–29)
AST: 14 U/L (ref 10–35)
Albumin: 4.1 g/dL (ref 3.6–5.1)
Alkaline phosphatase (APISO): 86 U/L (ref 37–153)
BUN: 9 mg/dL (ref 7–25)
CO2: 27 mmol/L (ref 20–32)
Calcium: 9.5 mg/dL (ref 8.6–10.4)
Chloride: 101 mmol/L (ref 98–110)
Creat: 0.84 mg/dL (ref 0.60–0.93)
GFR, Est African American: 81 mL/min/{1.73_m2} (ref >=60)
GFR, Est Non African American: 70 mL/min/{1.73_m2} (ref >=60)
Globulin: 2.3 g/dL (calc) (ref 1.9–3.7)
Glucose, Bld: 147 mg/dL — ABNORMAL HIGH (ref 65–99)
Potassium: 4 mmol/L (ref 3.5–5.3)
Sodium: 140 mmol/L (ref 135–146)
Total Bilirubin: 0.6 mg/dL (ref 0.2–1.2)
Total Protein: 6.4 g/dL (ref 6.1–8.1)

## 2020-04-07 LAB — URINALYSIS, ROUTINE W REFLEX MICROSCOPIC
Bilirubin Urine: NEGATIVE
Glucose, UA: NEGATIVE
Hgb urine dipstick: NEGATIVE
Ketones, ur: NEGATIVE
Leukocytes,Ua: NEGATIVE
Nitrite: NEGATIVE
Protein, ur: NEGATIVE
Specific Gravity, Urine: 1.02 (ref 1.001–1.03)
pH: 6 (ref 5.0–8.0)

## 2020-04-07 LAB — CBC WITH DIFFERENTIAL/PLATELET
Absolute Monocytes: 443 cells/uL (ref 200–950)
Basophils Absolute: 41 cells/uL (ref 0–200)
Basophils Relative: 0.7 %
Eosinophils Absolute: 130 cells/uL (ref 15–500)
Eosinophils Relative: 2.2 %
HCT: 43.8 % (ref 35.0–45.0)
Hemoglobin: 14.4 g/dL (ref 11.7–15.5)
Lymphs Abs: 2236 cells/uL (ref 850–3900)
MCH: 29.5 pg (ref 27.0–33.0)
MCHC: 32.9 g/dL (ref 32.0–36.0)
MCV: 89.8 fL (ref 80.0–100.0)
MPV: 11.6 fL (ref 7.5–12.5)
Monocytes Relative: 7.5 %
Neutro Abs: 3050 cells/uL (ref 1500–7800)
Neutrophils Relative %: 51.7 %
Platelets: 176 10*3/uL (ref 140–400)
RBC: 4.88 10*6/uL (ref 3.80–5.10)
RDW: 12.7 % (ref 11.0–15.0)
Total Lymphocyte: 37.9 %
WBC: 5.9 10*3/uL (ref 3.8–10.8)

## 2020-04-07 LAB — LIPID PANEL
Cholesterol: 234 mg/dL — ABNORMAL HIGH (ref <200)
HDL: 72 mg/dL (ref >=50)
LDL Cholesterol (Calc): 139 mg/dL (calc) — ABNORMAL HIGH
Non-HDL Cholesterol (Calc): 162 mg/dL (calc) — ABNORMAL HIGH (ref <130)
Total CHOL/HDL Ratio: 3.3 (calc) (ref <5.0)
Triglycerides: 111 mg/dL (ref <150)

## 2020-04-07 LAB — MAGNESIUM: Magnesium: 1.9 mg/dL (ref 1.5–2.5)

## 2020-04-07 LAB — MICROALBUMIN / CREATININE URINE RATIO
Creatinine, Urine: 183 mg/dL (ref 20–275)
Microalb Creat Ratio: 3 mcg/mg creat (ref <30)
Microalb, Ur: 0.5 mg/dL

## 2020-04-07 LAB — VITAMIN D 25 HYDROXY (VIT D DEFICIENCY, FRACTURES): Vit D, 25-Hydroxy: 52 ng/mL (ref 30–100)

## 2020-04-07 LAB — HEMOGLOBIN A1C
Hgb A1c MFr Bld: 5.6 % of total Hgb (ref <5.7)
Mean Plasma Glucose: 114 (calc)
eAG (mmol/L): 6.3 (calc)

## 2020-04-07 LAB — TSH: TSH: 0.49 mIU/L (ref 0.40–4.50)

## 2020-04-07 LAB — INSULIN, RANDOM: Insulin: 23.9 u[IU]/mL — ABNORMAL HIGH

## 2020-04-07 NOTE — Progress Notes (Signed)
============================================================  -    Total Chol = 234 - very High  (Ideal or Goal is less than 180)   - and   - Bad / Dangerous LDL Chol = 139, Also very High (Ideal or Goal is less than 70 !  )   - Recommend start taking Nexletol   - Do we need to start the prior approval process again ? ============================================================  -  A1c - Normal - Great - No Diabetes ============================================================ -  Vitamin D = 52   - Vitamin D goal is between 70-100.   - Please INCREASE your Vitamin D 5,000 units to   2 capsules = 10,000 units /daily    - It is very important as a natural anti-inflammatory and helping the  immune system protect against viral infections, like the Covid-19    helping hair, skin, and nails, as well as reducing stroke and heart attack risk.   - It helps your bones and helps with mood.  - It also decreases numerous cancer risks so please take it as directed.   - Low Vit D is associated with a 200-300% higher risk for CANCER   and 200-300% higher risk for HEART   ATTACK  &  STROKE.    - It is also associated with higher death rate at younger ages,   autoimmune diseases like Rheumatoid arthritis, Lupus, Multiple Sclerosis.     - Also many other serious conditions, like depression, Alzheimer's  Dementia, infertility, muscle aches, fatigue, fibromyalgia - just to name a few.  ==========================================================  -  All Else - CBC - Kidneys - Electrolytes - Liver - Magnesium & Thyroid    - all  Normal / OK ==========================================================

## 2020-06-15 ENCOUNTER — Ambulatory Visit (INDEPENDENT_AMBULATORY_CARE_PROVIDER_SITE_OTHER): Payer: Medicare Other

## 2020-06-15 ENCOUNTER — Other Ambulatory Visit: Payer: Self-pay

## 2020-06-15 VITALS — Temp 97.5°F

## 2020-06-15 DIAGNOSIS — Z23 Encounter for immunization: Secondary | ICD-10-CM | POA: Diagnosis not present

## 2020-07-13 ENCOUNTER — Ambulatory Visit: Payer: Medicare Other | Admitting: Adult Health

## 2020-08-04 ENCOUNTER — Ambulatory Visit: Payer: Medicare Other | Admitting: Adult Health

## 2020-08-21 DIAGNOSIS — G720 Drug-induced myopathy: Secondary | ICD-10-CM | POA: Insufficient documentation

## 2020-08-21 NOTE — Progress Notes (Signed)
FOLLOW UP  Assessment and Plan:   Atherosclerosis of aorta Per imaging; 10/2017 Control blood pressure, cholesterol, glucose, increase exercise.   Cholesterol Currently above goal; patient alleges severe statin/zetia intolerance; declined resins, PCSK9i,  Newly on nexlitol and tolerating well  Continue low cholesterol diet and exercise.  Check lipid panel.   Hx of prediabetes Recent A1Cs at goal Discussed diet/exercise, weight management  Defer A1C; check CMP  Overweight Long discussion about weight loss, diet, and exercise Recommended diet heavy in fruits and veggies and low in animal meats, cheeses, and dairy products, appropriate calorie intake Discussed ideal weight for height and initial weight goal (145lb) Will follow up in 3 months  Vitamin D Def At goal at last visit; continue supplementation to maintain goal of 60-100 Defer Vit D level  Recurrent major depression in remission In remission off of medications; monitor  Lifestyle discussed: diet/exerise, sleep hygiene, stress management, hydration   Continue diet and meds as discussed. Further disposition pending results of labs. Discussed med's effects and SE's.   Over 30 minutes of exam, counseling, chart review, and critical decision making was performed.   Future Appointments  Date Time Provider [place]  10/18/2020  9:30 AM Unk Pinto, MD [facility] None  04/09/2021  3:00 PM Unk Pinto, MD [facility] None    ----------------------------------------------------------------------------------------------------------------------  HPI 69 y.o. female  presents for 3 month follow up on hypertension, cholesterol, prediabetes, weight and vitamin D deficiency.   She has hx of depression, recently in remission and off of previous zoloft 50 mg and continues to do well off of medication, reports family stress improved.   She follows with ortho for back as needed, reports using a topical "real time"  cream which she reports works very well for her.   BMI is Body mass index is 26.75 kg/m., she reports has been working on diet/exercise, walks daily.  Wt Readings from Last 3 Encounters:  08/22/20 148 lb 9.6 oz (67.4 kg)  04/06/20 148 lb 12.8 oz (67.5 kg)  12/28/19 148 lb 3.2 oz (67.2 kg)   She has atherosclerosis of the aorta per CXR 10/2017.  Her blood pressure has been controlled at home, today their BP is BP: 128/64  She does workout. She denies chest pain, shortness of breath, dizziness.   She is not on cholesterol medication; she alleges severe myalgias on statins and zetia, declined trial of welchol. She has been on nexlitol since last visit, denies SE. She was referred to lipid clinic but ultimately declined PCSK9i Her cholesterol is not at goal. She is trying to limit fried foods, eating more salads, etc. The cholesterol last visit was:   Lab Results  Component Value Date   CHOL 234 (H) 04/06/2020   HDL 72 04/06/2020   LDLCALC 139 (H) 04/06/2020   LDLDIRECT 139.2 04/29/2012   TRIG 111 04/06/2020   CHOLHDL 3.3 04/06/2020    She has been working on diet and exercise for hx of prediabetes, and denies foot ulcerations, increased appetite, nausea, paresthesia of the feet, polydipsia, polyuria, visual disturbances and vomiting. Last A1C in the office was:  Lab Results  Component Value Date   HGBA1C 5.6 04/06/2020   Lab Results  Component Value Date   GFRNONAA 70 04/06/2020   Patient is on Vitamin D supplement.   Lab Results  Component Value Date   VD25OH 52 04/06/2020        Current Medications:  Current Outpatient Medications on File Prior to Visit  Medication Sig  . Bempedoic  Acid (NEXLETOL) 180 MG TABS Take 1 tablet Daily for Cholesterol  . cholecalciferol (VITAMIN D) 1000 UNITS tablet Take 5,000 Units by mouth daily.   Marland Kitchen ibuprofen (ADVIL,MOTRIN) 200 MG tablet Take 400 mg by mouth daily as needed for moderate pain.   . Multiple Vitamin (MULTIVITAMIN ADULT PO) Take  by mouth daily.  Marland Kitchen triamcinolone ointment (KENALOG) 0.5 % Apply 1 application topically 2 (two) times daily. Apply to hand eczema 2 x /day  . Multiple Vitamins-Minerals (ICAPS LUTEIN & OMEGA-3 PO) Take 1 capsule by mouth daily.   No current facility-administered medications on file prior to visit.     Allergies:  Allergies  Allergen Reactions  . Crestor [Rosuvastatin Calcium] Other (See Comments)    myalgias  . Red Yeast Rice [Cholestin] Other (See Comments)    Dizzy  . Tape Other (See Comments)    Pt states that skin burns, and it hurts severely.  Tori Milks [Naproxen Sodium] Rash  . Darvon [Propoxyphene] Nausea And Vomiting and Anxiety  . Latex Rash    Contact makes the skin feel "burned"     Medical History:  Past Medical History:  Diagnosis Date  . Anxiety   . DCIS (ductal carcinoma in situ) of breast 04/29/2012  . HTN (hypertension)   . Hyperlipidemia   . Pre-diabetes   . Vitamin D deficiency    Family history- Reviewed and unchanged Social history- Reviewed and unchanged   Review of Systems:  Review of Systems  Constitutional: Negative for malaise/fatigue and weight loss.  HENT: Negative for hearing loss and tinnitus.   Eyes: Negative for blurred vision and double vision.  Respiratory: Negative for cough, shortness of breath and wheezing.   Cardiovascular: Negative for chest pain, palpitations, orthopnea, claudication and leg swelling.  Gastrointestinal: Negative for abdominal pain, blood in stool, constipation, diarrhea, heartburn, melena, nausea and vomiting.  Genitourinary: Negative.   Musculoskeletal: Negative for back pain, joint pain and myalgias.  Skin: Negative for rash.  Neurological: Negative for dizziness, tingling, sensory change, weakness and headaches.  Endo/Heme/Allergies: Negative for polydipsia.  Psychiatric/Behavioral: Negative for depression, substance abuse and suicidal ideas. The patient is not nervous/anxious and does not have insomnia.   All  other systems reviewed and are negative.   Physical Exam: BP 128/64   Pulse 89   Temp 97.7 F (36.5 C)   Wt 148 lb 9.6 oz (67.4 kg)   SpO2 98%   BMI 26.75 kg/m  Wt Readings from Last 3 Encounters:  08/22/20 148 lb 9.6 oz (67.4 kg)  04/06/20 148 lb 12.8 oz (67.5 kg)  12/28/19 148 lb 3.2 oz (67.2 kg)   General Appearance: Well nourished, in no apparent distress. Eyes: PERRLA, EOMs, conjunctiva no swelling or erythema Sinuses: No Frontal/maxillary tenderness ENT/Mouth: Ext aud canals clear, TMs without erythema, bulging. No erythema, swelling, or exudate on post pharynx.  Tonsils not swollen or erythematous. Hearing normal.  Neck: Supple, thyroid normal  Respiratory: Respiratory effort normal, BS equal bilaterally without rales, rhonchi, wheezing or stridor.  Cardio: RRR with no MRGs. Brisk peripheral pulses without edema.  Abdomen: Soft, + BS.  Non tender, no guarding, rebound, hernias, masses. Lymphatics: Non tender without lymphadenopathy.  Musculoskeletal: Full ROM, 5/5 strength, Normal gait Skin: Warm, dry without rashes, lesions, ecchymosis.  Neuro: Cranial nerves intact. No cerebellar symptoms.  Psych: Awake and oriented X 3, normal affect, Insight and Judgment appropriate.    Izora Ribas, NP 10:55 AM [facility]

## 2020-08-22 ENCOUNTER — Encounter: Payer: Self-pay | Admitting: Adult Health

## 2020-08-22 ENCOUNTER — Other Ambulatory Visit: Payer: Self-pay

## 2020-08-22 ENCOUNTER — Ambulatory Visit (INDEPENDENT_AMBULATORY_CARE_PROVIDER_SITE_OTHER): Payer: Medicare Other | Admitting: Adult Health

## 2020-08-22 VITALS — BP 128/64 | HR 89 | Temp 97.7°F | Wt 148.6 lb

## 2020-08-22 DIAGNOSIS — R7303 Prediabetes: Secondary | ICD-10-CM | POA: Diagnosis not present

## 2020-08-22 DIAGNOSIS — E782 Mixed hyperlipidemia: Secondary | ICD-10-CM | POA: Diagnosis not present

## 2020-08-22 DIAGNOSIS — R0989 Other specified symptoms and signs involving the circulatory and respiratory systems: Secondary | ICD-10-CM

## 2020-08-22 DIAGNOSIS — I700 Atherosclerosis of aorta: Secondary | ICD-10-CM | POA: Diagnosis not present

## 2020-08-22 DIAGNOSIS — Z79899 Other long term (current) drug therapy: Secondary | ICD-10-CM

## 2020-08-22 DIAGNOSIS — G720 Drug-induced myopathy: Secondary | ICD-10-CM

## 2020-08-22 DIAGNOSIS — R7309 Other abnormal glucose: Secondary | ICD-10-CM

## 2020-08-22 DIAGNOSIS — F325 Major depressive disorder, single episode, in full remission: Secondary | ICD-10-CM

## 2020-08-22 DIAGNOSIS — E559 Vitamin D deficiency, unspecified: Secondary | ICD-10-CM

## 2020-08-22 DIAGNOSIS — E663 Overweight: Secondary | ICD-10-CM

## 2020-08-22 NOTE — Patient Instructions (Addendum)
Goals     LDL CALC < 130     Weight (lb) < 145 lb (65.8 kg)      Decrease saturated/trans fats, increase soluble fiber in diet for cholesterol - black beans, oatmeal, chia/flax seeds are excellent for this -    High-Fiber Diet Fiber, also called dietary fiber, is a type of carbohydrate that is found in fruits, vegetables, whole grains, and beans. A high-fiber diet can have many health benefits. Your health care provider may recommend a high-fiber diet to help:  Prevent constipation. Fiber can make your bowel movements more regular.  Lower your cholesterol.  Relieve the following conditions: ? Swelling of veins in the anus (hemorrhoids). ? Swelling and irritation (inflammation) of specific areas of the digestive tract (uncomplicated diverticulosis). ? A problem of the large intestine (colon) that sometimes causes pain and diarrhea (irritable bowel syndrome, IBS).  Prevent overeating as part of a weight-loss plan.  Prevent heart disease, type 2 diabetes, and certain cancers. What is my plan? The recommended daily fiber intake in grams (g) includes:  38 g for men age 41 or younger.  30 g for men over age 57.  48 g for women age 84 or younger.  21 g for women over age 77. You can get the recommended daily intake of dietary fiber by:  Eating a variety of fruits, vegetables, grains, and beans.  Taking a fiber supplement, if it is not possible to get enough fiber through your diet. What do I need to know about a high-fiber diet?  It is better to get fiber through food sources rather than from fiber supplements. There is not a lot of research about how effective supplements are.  Always check the fiber content on the nutrition facts label of any prepackaged food. Look for foods that contain 5 g of fiber or more per serving.  Talk with a diet and nutrition specialist (dietitian) if you have questions about specific foods that are recommended or not recommended for your medical  condition, especially if those foods are not listed below.  Gradually increase how much fiber you consume. If you increase your intake of dietary fiber too quickly, you may have bloating, cramping, or gas.  Drink plenty of water. Water helps you to digest fiber. What are tips for following this plan?  Eat a wide variety of high-fiber foods.  Make sure that half of the grains that you eat each day are whole grains.  Eat breads and cereals that are made with whole-grain flour instead of refined flour or white flour.  Eat brown rice, bulgur wheat, or millet instead of white rice.  Start the day with a breakfast that is high in fiber, such as a cereal that contains 5 g of fiber or more per serving.  Use beans in place of meat in soups, salads, and pasta dishes.  Eat high-fiber snacks, such as berries, raw vegetables, nuts, and popcorn.  Choose whole fruits and vegetables instead of processed forms like juice or sauce. What foods can I eat?  Fruits Berries. Pears. Apples. Oranges. Avocado. Prunes and raisins. Dried figs. Vegetables Sweet potatoes. Spinach. Kale. Artichokes. Cabbage. Broccoli. Cauliflower. Green peas. Carrots. Squash. Grains Whole-grain breads. Multigrain cereal. Oats and oatmeal. Brown rice. Barley. Bulgur wheat. [place]aj[place]. Quinoa. Bran muffins. Popcorn. Rye wafer crackers. Meats and other proteins Navy, kidney, and pinto beans. Soybeans. Split peas. Lentils. Nuts and seeds. Dairy Fiber-fortified yogurt. Beverages Fiber-fortified soy milk. Fiber-fortified orange juice. Other foods Fiber bars. The items listed  above may not be a complete list of recommended foods and beverages. Contact a dietitian for more options. What foods are not recommended? Fruits Fruit juice. Cooked, strained fruit. Vegetables Fried potatoes. Canned vegetables. Well-cooked vegetables. Grains White bread. Pasta made with refined flour. White rice. Meats and other proteins Fatty cuts of  meat. Fried chicken or fried fish. Dairy Milk. Yogurt. Cream cheese. Sour cream. Fats and oils Butters. Beverages Soft drinks. Other foods Cakes and pastries. The items listed above may not be a complete list of foods and beverages to avoid. Contact a dietitian for more information. Summary  Fiber is a type of carbohydrate. It is found in fruits, vegetables, whole grains, and beans.  There are many health benefits of eating a high-fiber diet, such as preventing constipation, lowering blood cholesterol, helping with weight loss, and reducing your risk of heart disease, diabetes, and certain cancers.  Gradually increase your intake of fiber. Increasing too fast can result in cramping, bloating, and gas. Drink plenty of water while you increase your fiber.  The best sources of fiber include whole fruits and vegetables, whole grains, nuts, seeds, and beans. This information is not intended to replace advice given to you by your health care provider. Make sure you discuss any questions you have with your health care provider. Document Revised: 06/23/2017 Document Reviewed: 06/23/2017 Elsevier Patient Education  2020 [employer].

## 2020-08-23 ENCOUNTER — Other Ambulatory Visit: Payer: Self-pay | Admitting: Adult Health

## 2020-08-23 DIAGNOSIS — E0590 Thyrotoxicosis, unspecified without thyrotoxic crisis or storm: Secondary | ICD-10-CM

## 2020-08-23 LAB — COMPLETE METABOLIC PANEL WITH GFR
AG Ratio: 2 (calc) (ref 1.0–2.5)
ALT: 11 U/L (ref 6–29)
AST: 19 U/L (ref 10–35)
Albumin: 4 g/dL (ref 3.6–5.1)
Alkaline phosphatase (APISO): 67 U/L (ref 37–153)
BUN: 12 mg/dL (ref 7–25)
CO2: 26 mmol/L (ref 20–32)
Calcium: 9.3 mg/dL (ref 8.6–10.4)
Chloride: 107 mmol/L (ref 98–110)
Creat: 0.72 mg/dL (ref 0.60–0.93)
GFR, Est African American: 98 mL/min/{1.73_m2} (ref >=60)
GFR, Est Non African American: 84 mL/min/{1.73_m2} (ref >=60)
Globulin: 2 g/dL (calc) (ref 1.9–3.7)
Glucose, Bld: 121 mg/dL — ABNORMAL HIGH (ref 65–99)
Potassium: 3.8 mmol/L (ref 3.5–5.3)
Sodium: 143 mmol/L (ref 135–146)
Total Bilirubin: 0.4 mg/dL (ref 0.2–1.2)
Total Protein: 6 g/dL — ABNORMAL LOW (ref 6.1–8.1)

## 2020-08-23 LAB — CBC WITH DIFFERENTIAL/PLATELET
Absolute Monocytes: 350 cells/uL (ref 200–950)
Basophils Absolute: 29 cells/uL (ref 0–200)
Basophils Relative: 0.6 %
Eosinophils Absolute: 72 cells/uL (ref 15–500)
Eosinophils Relative: 1.5 %
HCT: 38.4 % (ref 35.0–45.0)
Hemoglobin: 12.9 g/dL (ref 11.7–15.5)
Lymphs Abs: 1771 cells/uL (ref 850–3900)
MCH: 29.8 pg (ref 27.0–33.0)
MCHC: 33.6 g/dL (ref 32.0–36.0)
MCV: 88.7 fL (ref 80.0–100.0)
MPV: 11.3 fL (ref 7.5–12.5)
Monocytes Relative: 7.3 %
Neutro Abs: 2578 cells/uL (ref 1500–7800)
Neutrophils Relative %: 53.7 %
Platelets: 194 10*3/uL (ref 140–400)
RBC: 4.33 10*6/uL (ref 3.80–5.10)
RDW: 12.6 % (ref 11.0–15.0)
Total Lymphocyte: 36.9 %
WBC: 4.8 10*3/uL (ref 3.8–10.8)

## 2020-08-23 LAB — MAGNESIUM: Magnesium: 2.1 mg/dL (ref 1.5–2.5)

## 2020-08-23 LAB — LIPID PANEL
Cholesterol: 152 mg/dL (ref <200)
HDL: 58 mg/dL (ref >=50)
LDL Cholesterol (Calc): 78 mg/dL (calc)
Non-HDL Cholesterol (Calc): 94 mg/dL (calc) (ref <130)
Total CHOL/HDL Ratio: 2.6 (calc) (ref <5.0)
Triglycerides: 80 mg/dL (ref <150)

## 2020-08-23 LAB — TSH: TSH: 0.3 mIU/L — ABNORMAL LOW (ref 0.40–4.50)

## 2020-09-25 ENCOUNTER — Other Ambulatory Visit: Payer: Self-pay

## 2020-09-25 ENCOUNTER — Ambulatory Visit (INDEPENDENT_AMBULATORY_CARE_PROVIDER_SITE_OTHER): Payer: Medicare Other

## 2020-09-25 DIAGNOSIS — E0590 Thyrotoxicosis, unspecified without thyrotoxic crisis or storm: Secondary | ICD-10-CM

## 2020-09-25 NOTE — Progress Notes (Signed)
Patient reports for TSH which was entered in by ordering provider. Patients reports no med change & DOES NOT take any meds for thyroid.

## 2020-09-26 ENCOUNTER — Other Ambulatory Visit: Payer: Self-pay | Admitting: Adult Health

## 2020-09-26 DIAGNOSIS — E079 Disorder of thyroid, unspecified: Secondary | ICD-10-CM

## 2020-09-26 LAB — TSH: TSH: 0.38 mIU/L — ABNORMAL LOW (ref 0.40–4.50)

## 2020-10-18 ENCOUNTER — Ambulatory Visit: Payer: Medicare Other | Admitting: Internal Medicine

## 2020-12-04 ENCOUNTER — Ambulatory Visit: Payer: Medicare Other | Admitting: Adult Health Nurse Practitioner

## 2020-12-04 NOTE — Progress Notes (Deleted)
MEDICARE ANNUAL WELLNESS VISIT AND 3 MONTH FOLLOW UP  Assessment:    [name] was seen today for medicare wellness.  Diagnoses and all orders for this visit:  Diagnoses and all orders for this visit:  Encounter for Medicare annual wellness exam  Labile hypertension  Overweight (BMI 25.0-29.9)  Hyperlipidemia, mixed  Aortic atherosclerosis (HCC)  Statin myopathy  Abnormal glucose  Prediabetes  Vitamin D deficiency  Major depression in remission ([place])  Osteopenia, unspecified location  Degenerative spondylolisthesis  Spinal stenosis of lumbar region without neurogenic claudication  Former smoker  History of ductal carcinoma in situ (DCIS) of breast  Medication management      Follow Up Instructions:    I discussed the assessment and treatment plan with the patient. The patient was provided an opportunity to ask questions and all were answered. The patient agreed with the plan and demonstrated an understanding of the instructions.   The patient was advised to call back or seek an in-person evaluation if the symptoms worsen or if the condition fails to improve as anticipated.  I provided 30 minutes of face-to-face time during this encounter including counseling, chart review, and critical decision making was preformed.   Future Appointments  Date Time Provider [place]  12/04/2020  9:30 AM Garnet Sierras, NP [facility] None  04/09/2021  3:00 PM Unk Pinto, MD [facility] None  12/04/2021  9:30 AM Liane Comber, NP [facility] None      Plan:   During the course of the visit the patient was educated and counseled about appropriate screening and preventive services including:    Pneumococcal vaccine   Influenza vaccine  Prevnar 13  Td vaccine  Screening electrocardiogram, deferred, telephone visit [facility].  Colorectal cancer screening  Diabetes screening  Glaucoma screening  Nutrition counseling    Subjective:  Regina Shaw is a 70 y.o. female who presents for Medicare Annual Wellness Visit and 3 month follow up for labile HTN not on medication, hyperlipidemia, prediabetes, chronic seborrheic keratosis inflamed, and vitamin D Def.     On 09/19/18 she had a fall after going for a run.  She stepped off the curb onto what she thought was dirt but was slippery mud and she slid with Regina Shaw to left hand. Closed fracture of distal end of left radius with ORIF, plates and pins during hospital admission.  She is now doing physical therapy and reports this is going well.  She just had a follow up with Dr Regina Shaw and he recommends therapy for one more month. She reports her pain is controlled.    Reports she has a rash that is on her legs and top of her hands she has battled with for years.  She has been evaluated in our office as well as dermatology and unable to find a regiment to resolve this.  Reports it is itchy and scaly, no open areas at this time.  She has most recently tried protective ointment, ie diaper rash cream, that has helped the most.  Her blood pressure has been controlled at home, today their BP is   She does workout. She denies chest pain, shortness of breath, dizziness.  She is not on cholesterol medication and denies myalgias. Her cholesterol is not at goal. The cholesterol last visit was:   Lab Results  Component Value Date   CHOL 241 (H) 08/02/2019   HDL 69 08/02/2019   LDLCALC 145 (H) 08/02/2019   LDLDIRECT 139.2 04/29/2012   TRIG 145 08/02/2019   CHOLHDL 3.5  08/02/2019   She has been working on diet and exercise for prediabetes, and denies hyperglycemia, hypoglycemia , increased appetite, nausea, paresthesia of the feet, polydipsia, polyuria, visual disturbances and vomiting. Last A1C in the office was:   Lab Results  Component Value Date   HGBA1C 5.6 04/06/2020     Lab Results  Component Value Date   GFRNONAA 84 08/22/2020   Patient is on Vitamin D supplement.   Lab Results  Component  Value Date   VD25OH 52 04/06/2020      Has ben hyperthyroidism last two checks.  Recommended thyroid ultrasound.  As of today not completed.  Patient reports ***.   Denies fast heart beats, diarrhea, insomnia, and anxiety or irregular heart rhythm. Lab Results  Component Value Date   TSH 0.38 (L) 09/25/2020         Medication Review:   Current Outpatient Medications (Cardiovascular):  Regina Shaw  Bempedoic Acid (NEXLETOL) 180 MG TABS, Take 1 tablet Daily for Cholesterol   Current Outpatient Medications (Analgesics):  .  ibuprofen (ADVIL,MOTRIN) 200 MG tablet, Take 400 mg by mouth daily as needed for moderate pain.    Current Outpatient Medications (Other):  .  cholecalciferol (VITAMIN D) 1000 UNITS tablet, Take 5,000 Units by mouth daily.  .  Multiple Vitamin (MULTIVITAMIN ADULT PO), Take by mouth daily. Regina Shaw  triamcinolone ointment (KENALOG) 0.5 %, Apply 1 application topically 2 (two) times daily. Apply to hand eczema 2 x /day  Allergies: Allergies  Allergen Reactions  . Crestor [Rosuvastatin Calcium] Other (See Comments)    myalgias  . Red Yeast Rice [Cholestin] Other (See Comments)    Dizzy  . Statins     Reports has tried numerous, all with severe myalgias  . Tape Other (See Comments)    Pt states that skin burns, and it hurts severely.  Regina Shaw [Naproxen Sodium] Rash  . Darvon [Propoxyphene] Nausea And Vomiting and Anxiety  . Latex Rash    Contact makes the skin feel "burned"    Current Problems (verified) has History of ductal carcinoma in situ (DCIS) of breast; Hyperlipidemia, mixed; Prediabetes; Vitamin D deficiency; Medication management; Major depression in remission ([place]); Lumbago with Right Sciatica; Lumbar canal stenosis; Degenerative spondylolisthesis; Osteopenia; Overweight (BMI 25.0-29.9); Aortic atherosclerosis ([place]); Labile hypertension; Abnormal glucose; Former smoker; and Statin myopathy on their problem list.  Screening Tests Immunization History   Administered Date(s) Administered  . Influenza, High Dose Seasonal PF 05/05/2014, 06/15/2015, 06/04/2016, 06/17/2017, 07/15/2018, 06/08/2019, 06/15/2020  . PFIZER(Purple Top)SARS-COV-2 Vaccination 09/14/2019, 10/05/2019  . PPD Test 10/18/2013  . Pneumococcal Conjugate-13 05/05/2014  . Pneumococcal Polysaccharide-23 10/18/2014  . Tdap 10/18/2014  . Zoster 11/06/2009   Last colonoscopy: 2006 Cologuard: 2016 Last mammogram: Scheduled 02/2019  need reports - [facility] - records requested Last pap smear/pelvic exam: 2018 - OBGYN - never abnormal pap DEXA:2010, 2015, scheduled6/2020 by [facility] - osteopenia without significant change   Prior vaccinations: TD or Tdap: 2016         Influenza: 2018            Pneumococcal: 2016 Prevnar13: 2015 Shingles/Zostavax: 2011 Shingrix: Due, discussed with patient, local pharm Covid-19 : Complete 10/05/19     Names of Other Physician/Practitioners you currently use: 1. [facility] here for primary care 2. Eye Exam , [place] 3. Dentist Dr Mariea Clonts, 11/2019  Patient Care Team: Unk Pinto, MD as PCP - General (Internal Medicine) Ladene Artist, MD as Consulting Physician (Gastroenterology) Rozetta Nunnery,  MD as Consulting Physician (Otolaryngology) Susa Day, MD as Consulting Physician (Orthopedic Surgery) Nahser, Wonda Cheng, MD as Consulting Physician (Cardiology)  Surgical: She  has a past surgical history that includes Tubal ligation; Breast lumpectomy (09/2009); Lumbar disc surgery; Tonsillectomy and adenoidectomy (1960); Cesarean section; Laminectomy (Right, 07/18/2014); and Open reduction internal fixation (orif) distal radial fracture (Left, 09/23/2018). Family Her family history includes Alcohol abuse in her brother; COPD in her father; Heart attack (age of onset: 41) in her brother; Heart attack (age of onset: 68) in her sister; Liver disease in her brother and  mother; Stroke (age of onset: 74) in her brother. Social history  She reports that she quit smoking about 15 years ago. She has a 10.00 pack-year smoking history. She has never used smokeless tobacco. She reports that she does not drink alcohol and does not use drugs.  MEDICARE WELLNESS OBJECTIVES: Physical activity:   Cardiac risk factors:   Depression/mood screen:   Depression screen [facility] 2/9 08/22/2020  Decreased Interest 0  Down, Depressed, Hopeless 0  PHQ - 2 Score 0  Altered sleeping 0  Tired, decreased energy 0  Change in appetite 0  Feeling bad or failure about yourself  0  Trouble concentrating 0  Moving slowly or fidgety/restless 0  Suicidal thoughts 0  PHQ-9 Score 0  Difficult doing work/chores Not difficult at all    ADLs:  In your present state of health, do you have any difficulty performing the following activities: 04/06/2020  Hearing? N  Vision? N  Difficulty concentrating or making decisions? N  Walking or climbing stairs? N  Dressing or bathing? N  Doing errands, shopping? N  Some recent data might be hidden     Cognitive Testing  Alert? Yes  Normal Appearance?Yes  Oriented to person? Yes  Place? Yes   Time? Yes  Recall of three objects?  Yes  Can perform simple calculations? Yes  Displays appropriate judgment?Yes  Can read the correct time from a watch face?Yes  EOL planning:     Objective:   There were no vitals filed for this visit. There is no height or weight on file to calculate BMI.  General : Well sounding patient in no apparent distress HEENT: no hoarseness, no cough for duration of visit Lungs: speaks in complete sentences, no audible wheezing, no apparent distress Neurological: alert, oriented x 3 Psychiatric: pleasant, judgement appropriate   Medicare Attestation I have personally reviewed: The patient's medical and social history Their use of alcohol, tobacco or illicit drugs Their current medications and supplements The  patient's functional ability including ADLs,fall risks, home safety risks, cognitive, and hearing and visual impairment Diet and physical activities Evidence for depression or mood disorders  The patient's weight, height, BMI, and visual acuity have been recorded in the chart.  I have made referrals, counseling, and provided education to the patient based on review of the above and I have provided the patient with a written personalized care plan for preventive services.     Garnet Sierras, NP [facility] 12/04/2020  9:05 AM

## 2021-02-13 ENCOUNTER — Encounter: Payer: Self-pay | Admitting: Adult Health

## 2021-02-13 ENCOUNTER — Ambulatory Visit: Payer: Medicare Other | Admitting: Adult Health

## 2021-02-13 NOTE — Progress Notes (Deleted)
MEDICARE ANNUAL WELLNESS VISIT AND 3 MONTH FOLLOW UP Assessment:    Regina Shaw was seen today for medicare wellness.  Diagnoses and all orders for this visit:  Medicare annual wellness visit, subsequent Yearly  Atherosclerosis of aorta ([place]) - CXR 10/2017 Control blood pressure, cholesterol, glucose, increase exercise.   Labile hypertension Continue to monitor Continue healthy eating habits and exercise Doing well  BMI 26.0-26.9,adult Discussed dietary and exercise modifications  Hyperlipidemia, mixed Discussed dietary and exercise modifications No medications at this time Will check labs next office visit  Prediabetes Doing well Continue healthy eating habits and exercise  Vitamin D deficiency Taking supplementation Will check level next office visit  Depression in remission ([place]) In remission off of medication Lifestyle discussed: diet/exerise, sleep hygiene, stress management, hydration  Medication management Continued  Osteopenia get dexa q2y ***, continue Vit D and Ca, weight bearing exercises  Hx of L breast cancer Mammogram annually ***  Seborrheic keratoses, inflamed Using OTC protective ointment Discussed second dermatology opinion? Hematology? ***    Follow Up Instructions:    I discussed the assessment and treatment plan with the patient. The patient was provided an opportunity to ask questions and all were answered. The patient agreed with the plan and demonstrated an understanding of the instructions.   The patient was advised to call back or seek an in-person evaluation if the symptoms worsen or if the condition fails to improve as anticipated.  I provided 30 minutes of face-to-face time during this encounter including counseling, chart review, and critical decision making was preformed.   Future Appointments  Date Time Provider [place]  02/13/2021  3:30 PM Liane Comber, NP [facility] None  04/09/2021  3:00 PM Unk Pinto, MD [facility] None  02/13/2022  3:30 PM Liane Comber, NP [facility] None      Plan:   During the course of the visit the patient was educated and counseled about appropriate screening and preventive services including:   Pneumococcal vaccine  Influenza vaccine Prevnar 13 Td vaccine Screening electrocardiogram, deferred, telephone visit [place]. Colorectal cancer screening Diabetes screening Glaucoma screening Nutrition counseling    Subjective:  Regina Shaw is a 70 y.o. female who presents for Medicare Annual Wellness Visit and 3 month follow up for labile HTN not on medication, hyperlipidemia, glucose management and vitamin D Def.   She has hx of L breast cancer s/p lumpectomy and radiation.   She has hx of depression, recently in remission and off of previous zoloft 50 mg and continues to do well off of medication, reports family stress improved.   She follows with ortho for back as needed, reports using a topical "real time" cream which she reports works very well for her.   BMI is There is no height or weight on file to calculate BMI., she {HAS HAS ION:62952} been working on diet and exercise. Wt Readings from Last 3 Encounters:  09/25/20 147 lb (66.7 kg)  08/22/20 148 lb 9.6 oz (67.4 kg)  04/06/20 148 lb 12.8 oz (67.5 kg)   She is a former smoker *** She has aortic atherosclerosis per CXR in 2019.   Her blood pressure {HAS HAS NOT:18834} been controlled at home, today their BP is    She {DOES_DOES WUX:32440} workout. She denies chest pain, shortness of breath, dizziness.   She is on cholesterol medication; she alleges severe myalgias on statins and zetia, declined trial of welchol. She has been on nexlitol since 2021, denies SE. She was referred to lipid  clinic but ultimately declined PCSK9i. Her cholesterol is at goal. She is trying to limit fried foods, eating more salads, etc. The cholesterol last visit was:   Lab Results  Component Value Date    CHOL 152 08/22/2020   HDL 58 08/22/2020   LDLCALC 78 08/22/2020   LDLDIRECT 139.2 04/29/2012   TRIG 80 08/22/2020   CHOLHDL 2.6 08/22/2020    She {Has/has not:18111} been working on diet and exercise for glucose management (hx of prediabetes), and denies {Symptoms; diabetes w/o none:19199}. Last A1C in the office was:  Lab Results  Component Value Date   HGBA1C 5.6 04/06/2020   Patient is on Vitamin D supplement.   Lab Results  Component Value Date   VD25OH 52 04/06/2020       Medication Review:   Current Outpatient Medications (Cardiovascular):    Bempedoic Acid (NEXLETOL) 180 MG TABS, Take 1 tablet Daily for Cholesterol   Current Outpatient Medications (Analgesics):    ibuprofen (ADVIL,MOTRIN) 200 MG tablet, Take 400 mg by mouth daily as needed for moderate pain.    Current Outpatient Medications (Other):    cholecalciferol (VITAMIN D) 1000 UNITS tablet, Take 5,000 Units by mouth daily.    Multiple Vitamin (MULTIVITAMIN ADULT PO), Take by mouth daily.   triamcinolone ointment (KENALOG) 0.5 %, Apply 1 application topically 2 (two) times daily. Apply to hand eczema 2 x /day  Allergies: Allergies  Allergen Reactions   Crestor [Rosuvastatin Calcium] Other (See Comments)    myalgias   Red Yeast Rice [Cholestin] Other (See Comments)    Dizzy   Statins     Reports has tried numerous, all with severe myalgias   Tape Other (See Comments)    Pt states that skin burns, and it hurts severely.   Aleve [Naproxen Sodium] Rash   Darvon [Propoxyphene] Nausea And Vomiting and Anxiety   Latex Rash    Contact makes the skin feel "burned"    Current Problems (verified) has History of ductal carcinoma in situ (DCIS) of breast; Hyperlipidemia, mixed; Prediabetes; Vitamin D deficiency; Medication management; Major depression in remission ([place]); Lumbago with Right Sciatica; Lumbar canal stenosis; Degenerative spondylolisthesis; Osteopenia; Overweight (BMI 25.0-29.9); Aortic  atherosclerosis ([place]); Labile hypertension; Abnormal glucose; Former smoker; and Statin myopathy on their problem list.  Screening Tests Immunization History  Administered Date(s) Administered   Influenza, High Dose Seasonal PF 05/05/2014, 06/15/2015, 06/04/2016, 06/17/2017, 07/15/2018, 06/08/2019, 06/15/2020   PFIZER(Purple Top)SARS-COV-2 Vaccination 09/14/2019, 10/05/2019   PPD Test 10/18/2013   Pneumococcal Conjugate-13 05/05/2014   Pneumococcal Polysaccharide-23 10/18/2014   Tdap 10/18/2014   Zoster, Live 11/06/2009    *** Last colonoscopy: 2006 Cologuard: 2016 Last mammogram: Scheduled 02/2019  need reports - [facility] - records requested Last pap smear/pelvic exam: 2018 - OBGYN - never abnormal pap DEXA:2010, 2015, scheduled6/2020 by [facility] - osteopenia without significant change    Prior vaccinations: TD or Tdap: 2016         Influenza: 2018            Pneumococcal: 2016 Prevnar13: 2015 Shingles/Zostavax: 2011 Shingrix: Due, discussed with patient, local pharm Covid-19 : Complete 10/05/19   Names of Other Physician/Practitioners you currently use: 1. [facility] here for primary care 2. Eye Exam , [place] 3. Dentist Dr Mariea Clonts, 11/2019  Patient Care Team: Unk Pinto, MD as PCP - General (Internal Medicine) Ladene Artist, MD as Consulting Physician (Gastroenterology) Rozetta Nunnery, MD as Consulting Physician (Otolaryngology) Susa Day, MD as Consulting  Physician (Orthopedic Surgery) Nahser, Wonda Cheng, MD as Consulting Physician (Cardiology)  Surgical: She  has a past surgical history that includes Tubal ligation; Breast lumpectomy (09/2009); Lumbar disc surgery; Tonsillectomy and adenoidectomy (1960); Cesarean section; Laminectomy (Right, 07/18/2014); and Open reduction internal fixation (orif) distal radial fracture (Left, 09/23/2018). Family Her family history includes Alcohol abuse in  her brother; COPD in her father; Heart attack (age of onset: 46) in her brother; Heart attack (age of onset: 27) in her sister; Liver disease in her brother and mother; Stroke (age of onset: 23) in her brother. Social history  She reports that she quit smoking about 15 years ago. She has a 10.00 pack-year smoking history. She has never used smokeless tobacco. She reports that she does not drink alcohol and does not use drugs.  MEDICARE WELLNESS OBJECTIVES: Physical activity:   Cardiac risk factors:   Depression/mood screen:   Depression screen [facility] 2/9 08/22/2020  Decreased Interest 0  Down, Depressed, Hopeless 0  PHQ - 2 Score 0  Altered sleeping 0  Tired, decreased energy 0  Change in appetite 0  Feeling bad or failure about yourself  0  Trouble concentrating 0  Moving slowly or fidgety/restless 0  Suicidal thoughts 0  PHQ-9 Score 0  Difficult doing work/chores Not difficult at all    ADLs:  In your present state of health, do you have any difficulty performing the following activities: 04/06/2020  Hearing? N  Vision? N  Difficulty concentrating or making decisions? N  Walking or climbing stairs? N  Dressing or bathing? N  Doing errands, shopping? N  Some recent data might be hidden     Cognitive Testing  Alert? Yes  Normal Appearance?Yes  Oriented to person? Yes  Place? Yes   Time? Yes  Recall of three objects?  Yes  Can perform simple calculations? Yes  Displays appropriate judgment?Yes  Can read the correct time from a watch face?Yes  EOL planning:     Objective:   There were no vitals filed for this visit.  There is no height or weight on file to calculate BMI.  General Appearance: Well nourished, in no apparent distress. Eyes: PERRLA, EOMs, conjunctiva no swelling or erythema Sinuses: No Frontal/maxillary tenderness ENT/Mouth: Ext aud canals clear, TMs without erythema, bulging. No erythema, swelling, or exudate on post pharynx.  Tonsils not swollen or  erythematous. Hearing normal.  Neck: Supple, thyroid normal. *** Respiratory: Respiratory effort normal, BS equal bilaterally without rales, rhonchi, wheezing or stridor.  Cardio: RRR with no MRGs. Brisk peripheral pulses without edema.  Abdomen: Soft, + BS.  Non tender, no guarding, rebound, hernias, masses. Lymphatics: Non tender without lymphadenopathy.  Musculoskeletal: Full ROM, 5/5 strength, Normal gait Skin: Warm, dry without rashes, lesions, ecchymosis.  Neuro: Cranial nerves intact. No cerebellar symptoms.  Psych: Awake and oriented X 3, normal affect, Insight and Judgment appropriate.   Medicare Attestation I have personally reviewed: The patient's medical and social history Their use of alcohol, tobacco or illicit drugs Their current medications and supplements The patient's functional ability including ADLs,fall risks, home safety risks, cognitive, and hearing and visual impairment Diet and physical activities Evidence for depression or mood disorders  The patient's weight, height, BMI, and visual acuity have been recorded in the chart.  I have made referrals, counseling, and provided education to the patient based on review of the above and I have provided the patient with a written personalized care plan for preventive services.    Izora Ribas, NP  8:54 AM [facility]

## 2021-04-02 ENCOUNTER — Other Ambulatory Visit: Payer: Self-pay

## 2021-04-03 ENCOUNTER — Encounter: Payer: Medicare Other | Admitting: Internal Medicine

## 2021-04-09 ENCOUNTER — Ambulatory Visit: Payer: Medicare Other | Admitting: Internal Medicine

## 2021-04-09 ENCOUNTER — Encounter: Payer: Self-pay | Admitting: Internal Medicine

## 2021-04-09 NOTE — Progress Notes (Signed)
R  E  S  C  H  E  D  U  L  E  D                                                                                                                                                                                                                                                                                              This very nice 70 y.o. WWF presents for a Screening /Preventative Visit & comprehensive evaluation and management of multiple medical co-morbidities.  Patient has been followed for HTN, HLD, Prediabetes  and Vitamin D Deficiency.  Patient has hx/o Osteopenia by dexaBMD in 2018. CXR uin 2019 showed Aortic Atherosclerosis.         HTN predates since 2009. Patient's BP has been controlled at home and patient denies any cardiac symptoms as chest pain, palpitations, shortness of breath, dizziness or ankle swelling. Today's          Patient is intolerant to Statins & Zetia, but her hyperlipidemia is controlled with diet and Nexletol.  Patient denies myalgias or other medication SE's. Last lipids were at goal:  Lab Results  Component Value Date   CHOL 152 08/22/2020   HDL 58 08/22/2020   LDLCALC 78 08/22/2020   LDLDIRECT 139.2 04/29/2012   TRIG 80 08/22/2020   CHOLHDL 2.6 08/22/2020         Patient has hx/o prediabetes (A1c 6.3% /2008 and 5.9% /2016) and patient denies reactive hypoglycemic symptoms, visual blurring, diabetic polys or paresthesias. Last A1c was normal & at goal:  Lab Results  Component Value Date   HGBA1C 5.6 04/06/2020         Finally, patient has history of Vitamin D Deficiency ("24" /2008) and last Vitamin D was at goal:  Lab Results  Component Value Date   VD25OH 52  04/06/2020     2. Labile hypertension  - EKG 12-Lead - Urinalysis, Routine w reflex microscopic - Microalbumin / creatinine urine ratio - CBC with Differential/Platelet - COMPLETE METABOLIC PANEL WITH GFR - Magnesium - TSH  3. Hyperlipidemia, mixed  -  EKG 12-Lead - Lipid panel - TSH  4. Abnormal glucose  - EKG 12-Lead - Hemoglobin A1c - Insulin, random  5. Vitamin D deficiency  - VITAMIN D 25 Hydroxy   6. Aortic atherosclerosis ([place]) by CXR in 10/2017  - EKG 12-Lead  7. Statin myopathy   8. Screening for colorectal cancer  - POC Hemoccult Bld/Stl  9. Screening for ischemic heart disease  - EKG 12-Lead  10. FHx: heart disease  - EKG 12-Lead  11. Former smoker  - EKG 12-Lead  12. Medication management  - Urinalysis, Routine w reflex microscopic - Microalbumin / creatinine urine ratio - CBC with Differential/Platelet - COMPLETE METABOLIC PANEL WITH GFR - Magnesium - Lipid panel - TSH - Hemoglobin A1c - Insulin, random - VITAMIN D 25 Hydroxy

## 2021-04-09 NOTE — Patient Instructions (Signed)

## 2021-04-18 NOTE — Progress Notes (Signed)
Annual Screening/Preventative Visit & Comprehensive Evaluation &  Examination  Future Appointments  Date Time Provider [place]  04/19/2021    -  CPE 10:00 AM Unk Pinto, MD [facility] None  02/13/2022    - Wellness  3:30 PM Liane Comber, NP [facility] None  04/23/2022   -    CPE  10:00 AM Unk Pinto, MD [facility] None        This very nice 70 y.o. WWF presents for a Screening /Preventative Visit & comprehensive evaluation and management of multiple medical co-morbidities.  Patient has been followed for HTN, HLD, Prediabetes  and Vitamin D Deficiency.  In 2011, patient was treated for CIS of the Lt Breast  by lumpectomy and Radiation. CXR in Mar 2019 showed Aortic Atherosclerosis.        HTN predates since 2009. Patient's BP has been controlled off  meds at home and patient denies any cardiac symptoms as chest pain, palpitations, shortness of breath, dizziness or ankle swelling. Today's BP is at goal -  136/70.        Patient  is Statin & Zetia Intolerant and hyperlipidemia is controlled with diet and Nexletol. Patient denies myalgias or other medication SE's. Last lipids were at goal:  Lab Results  Component Value Date   CHOL 152 08/22/2020   HDL 58 08/22/2020   LDLCALC 78 08/22/2020   TRIG 80 08/22/2020   CHOLHDL 2.6 08/22/2020         Patient has hx/o prediabetes (A1c 6.3% /2008 & 5.9% /2016) and patient denies reactive hypoglycemic symptoms, visual blurring, diabetic polys or paresthesias. Last A1c was normal & at goal:  Lab Results  Component Value Date   HGBA1C 5.6 04/06/2020         Finally, patient has history of Vitamin D Deficiency ("24" /2008) and last Vitamin D was near goal  (70-100):  Lab Results  Component Value Date   VD25OH 52 04/06/2020     Current Outpatient Medications on File Prior to Visit  Medication Sig   NEXLETOL 180 MG TABS Take 1 tablet Daily for Cholesterol   VITAMIN D 5,000 Units  Take daily.    ibuprofen 200 MG  tablet Take 400 mg  daily as needed for moderate pain.    Multiple Vitamin  Take  daily.   triamcinolone oint   0  .5 % Apply  2  times daily  to hand eczema      Allergies  Allergen Reactions   Crestor [Rosuvastatin Calcium] myalgias   Red Yeast Rice [Cholestin] Dizzy   Statins severe myalgias   Tape  skin burns   Aleve [Naproxen Sodium] Rash   Darvon [Propoxyphene] Nausea And Vomiting and Anxiety   Latex Rash     Past Medical History:  Diagnosis Date   Anxiety    DCIS (ductal carcinoma in situ) of breast 04/29/2012   HTN (hypertension)    Hyperlipidemia    Prediabetes 09/07/2013   Vitamin D deficiency      Health Maintenance  Topic Date Due   Zoster Vaccines- Shingrix (1 of 2) Never done   COVID-19 Vaccine (3 - Pfizer risk series) 11/02/2019   Fecal DNA (Cologuard)  01/15/2021   MAMMOGRAM  04/04/2021   INFLUENZA VACCINE  04/02/2021   TETANUS/TDAP  10/18/2024   DEXA SCAN  Completed   Hepatitis C Screening  Completed   PNA vac Low Risk Adult  Completed   HPV VACCINES  Aged Out     Immunization History  Administered  Date(s) Administered   Influenza, High Dose  07/15/2018, 06/08/2019, 06/15/2020   PFIZER( SARS-COV-2 Vacc 09/14/2019, 10/05/2019   PPD Test 10/18/2013   Pneumococcal -13 05/05/2014   Pneumococcal -23 10/18/2014   Tdap 10/18/2014   Zoster, Live 11/06/2009    Last Colon - 06/23/2005 - Dr Fuller Plan - 37 yr f/u recc - due 2016 - patient declines  Cologard 12/2017 - Negative   Last MGM - 04/04/2020   Past Surgical History:  Procedure Laterality Date   BREAST LUMPECTOMY  09/2009   [place] Right 07/18/2014   Procedure: Right Lumbar Four to Five Laminectomy for Synovial cyst;  Surgeon: Kristeen Miss, MD;  Location: [facility];  Service: Neurosurgery;  Laterality: Right;  Right L4-5 Laminectomy for Synovial cyst   LUMBAR DISC SURGERY     [facility]   OPEN REDUCTION INTERNAL FIXATION (ORIF) DISTAL RADIAL  FRACTURE Left 09/23/2018   Procedure: OPEN REDUCTION INTERNAL FIXATION (ORIF) DISTAL RADIAL FRACTURE;  Surgeon: Iran Planas, MD;  Location: [facility];  Service: Orthopedics;  Laterality: Left;   TONSILLECTOMY AND ADENOIDECTOMY  1960   TUBAL LIGATION       Family History  Problem Relation Age of Onset   Liver disease Mother        d/c at 60, acute liver failure   COPD Father        d/c at 46   Heart attack Brother 14       bypass x 5   Stroke Brother 70       same brother   Alcohol abuse Brother        other brother   Liver disease Brother    Heart attack Sister 79     Social History   Tobacco Use   Smoking status: Former    Packs/day: 1.00    Years: 10.00    Pack years: 10.00    Types: Cigarettes    Quit date: 09/02/2005    Years since quitting: 15.6   Smokeless tobacco: Never  Vaping Use   Vaping Use: Never used  Substance Use Topics   Alcohol use: No   Drug use: No      ROS Constitutional: Denies fever, chills, weight loss/gain, headaches, insomnia,  night sweats, and change in appetite. Does c/o fatigue. Eyes: Denies redness, blurred vision, diplopia, discharge, itchy, watery eyes.  ENT: Denies discharge, congestion, post nasal drip, epistaxis, sore throat, earache, hearing loss, dental pain, Tinnitus, Vertigo, Sinus pain, snoring.  Cardio: Denies chest pain, palpitations, irregular heartbeat, syncope, dyspnea, diaphoresis, orthopnea, PND, claudication, edema Respiratory: denies cough, dyspnea, DOE, pleurisy, hoarseness, laryngitis, wheezing.  Gastrointestinal: Denies dysphagia, heartburn, reflux, water brash, pain, cramps, nausea, vomiting, bloating, diarrhea, constipation, hematemesis, melena, hematochezia, jaundice, hemorrhoids Genitourinary: Denies dysuria, frequency, urgency, nocturia, hesitancy, discharge, hematuria, flank pain Breast: Breast lumps, nipple discharge, bleeding.  Musculoskeletal: Denies arthralgia, myalgia, stiffness, Jt. Swelling, pain, limp,  and strain/sprain. Denies falls. Skin: Denies puritis, rash, hives, warts, acne, eczema, changing in skin lesion Neuro: No weakness, tremor, incoordination, spasms, paresthesia, pain Psychiatric: Denies confusion, memory loss, sensory loss. Denies Depression. Endocrine: Denies change in weight, skin, hair change, nocturia, and paresthesia, diabetic polys, visual blurring, hyper / hypo glycemic episodes.  Heme/Lymph: No excessive bleeding, bruising, enlarged lymph nodes.  Physical Exam  BP 136/70   Pulse 77   Temp 97.7 F (36.5 C)   Resp 16   Ht 5' 2.5" (1.588 m)   Wt 136 lb (61.7  kg)   SpO2 99%   BMI 24.48 kg/m   General Appearance: Well nourished, well groomed and in no apparent distress.  Eyes: PERRLA, EOMs, conjunctiva no swelling or erythema, normal fundi and vessels. Sinuses: No frontal/maxillary tenderness ENT/Mouth: EACs patent / TMs  nl. Nares clear without erythema, swelling, mucoid exudates. Oral hygiene is good. No erythema, swelling, or exudate. Tongue normal, non-obstructing. Tonsils not swollen or erythematous. Hearing normal.  Neck: Supple, thyroid not palpable. No bruits, nodes or JVD. Respiratory: Respiratory effort normal.  BS equal and clear bilateral without rales, rhonci, wheezing or stridor. Cardio: Heart sounds are normal with regular rate and rhythm and no murmurs, rubs or gallops. Peripheral pulses are normal and equal bilaterally without edema. No aortic or femoral bruits. Chest: symmetric with normal excursions and percussion. Breasts: Symmetric, without lumps, nipple discharge, retractions, or fibrocystic changes.  Abdomen: Flat, soft with bowel sounds active. Nontender, no guarding, rebound, hernias, masses, or organomegaly.  Lymphatics: Non tender without lymphadenopathy.  Genitourinary:  Musculoskeletal: Full ROM all peripheral extremities, joint stability, 5/5 strength, and normal gait. Skin: Warm and dry without rashes, lesions, cyanosis, clubbing or   ecchymosis.  Neuro: Cranial nerves intact, reflexes equal bilaterally. Normal muscle tone, no cerebellar symptoms. Sensation intact.  Pysch: Alert and oriented X 3, normal affect, Insight and Judgment appropriate.    Assessment and Plan  1. Annual Preventative Screening Examination   2. Labile hypertension  - EKG 12-Lead - Urinalysis, Routine w reflex microscopic - CBC with Differential/Platelet - COMPLETE METABOLIC PANEL WITH GFR - Magnesium - TSH - Microalbumin / creatinine urine ratio  3. Hyperlipidemia, mixed  - EKG 12-Lead - Lipid panel - TSH  4. Abnormal glucose  - EKG 12-Lead - Hemoglobin A1c - Insulin, random  5. Vitamin D deficiency  - VITAMIN D 25 Hydroxy   6. Aortic atherosclerosis ([place]) by CXR in 10/2017  - EKG 12-Lead  7. Statin myopathy   8. Screening for colorectal cancer  - POC Hemoccult Bld/Stl  - Cologuard  9. Screening for ischemic heart disease  - EKG 12-Lead  10. FHx: heart disease  - EKG 12-Lead  11. Former smoker  - EKG 12-Lead  12. Medication management  - Urinalysis, Routine w reflex microscopic - CBC with Differential/Platelet - COMPLETE METABOLIC PANEL WITH GFR - Magnesium - Lipid panel - TSH - Hemoglobin A1c - Insulin, random - VITAMIN D 25 Hydroxy - Microalbumin / creatinine urine ratio  13. Chronic anxiety  - escitalopram (LEXAPRO) 10 MG tablet;  Take 1 tablet Daily for Mood   Dispense: 90 tablet; Refill: 3          Patient was counseled in prudent diet to achieve/maintain BMI less than 25 for weight control, BP monitoring, regular exercise and medications. Discussed med's effects and SE's. Screening labs and tests as requested with regular follow-up as recommended. Over 40 minutes of exam, counseling, chart review and high complex critical decision making was performed.   Kirtland Bouchard, MD

## 2021-04-18 NOTE — Patient Instructions (Signed)

## 2021-04-19 ENCOUNTER — Other Ambulatory Visit: Payer: Self-pay

## 2021-04-19 ENCOUNTER — Ambulatory Visit (INDEPENDENT_AMBULATORY_CARE_PROVIDER_SITE_OTHER): Payer: Medicare Other | Admitting: Internal Medicine

## 2021-04-19 VITALS — BP 136/70 | HR 77 | Temp 97.7°F | Resp 16 | Ht 62.5 in | Wt 136.0 lb

## 2021-04-19 DIAGNOSIS — R0989 Other specified symptoms and signs involving the circulatory and respiratory systems: Secondary | ICD-10-CM | POA: Diagnosis not present

## 2021-04-19 DIAGNOSIS — Z0000 Encounter for general adult medical examination without abnormal findings: Secondary | ICD-10-CM | POA: Diagnosis not present

## 2021-04-19 DIAGNOSIS — Z0001 Encounter for general adult medical examination with abnormal findings: Secondary | ICD-10-CM

## 2021-04-19 DIAGNOSIS — Z79899 Other long term (current) drug therapy: Secondary | ICD-10-CM

## 2021-04-19 DIAGNOSIS — E782 Mixed hyperlipidemia: Secondary | ICD-10-CM

## 2021-04-19 DIAGNOSIS — Z136 Encounter for screening for cardiovascular disorders: Secondary | ICD-10-CM

## 2021-04-19 DIAGNOSIS — Z8249 Family history of ischemic heart disease and other diseases of the circulatory system: Secondary | ICD-10-CM

## 2021-04-19 DIAGNOSIS — Z1211 Encounter for screening for malignant neoplasm of colon: Secondary | ICD-10-CM

## 2021-04-19 DIAGNOSIS — Z87891 Personal history of nicotine dependence: Secondary | ICD-10-CM

## 2021-04-19 DIAGNOSIS — I700 Atherosclerosis of aorta: Secondary | ICD-10-CM | POA: Diagnosis not present

## 2021-04-19 DIAGNOSIS — G720 Drug-induced myopathy: Secondary | ICD-10-CM

## 2021-04-19 DIAGNOSIS — F419 Anxiety disorder, unspecified: Secondary | ICD-10-CM

## 2021-04-19 DIAGNOSIS — R7309 Other abnormal glucose: Secondary | ICD-10-CM

## 2021-04-19 DIAGNOSIS — T466X5A Adverse effect of antihyperlipidemic and antiarteriosclerotic drugs, initial encounter: Secondary | ICD-10-CM

## 2021-04-19 DIAGNOSIS — E559 Vitamin D deficiency, unspecified: Secondary | ICD-10-CM

## 2021-04-19 MED ORDER — ESCITALOPRAM OXALATE 10 MG PO TABS: ORAL_TABLET | ORAL | 3 refills | Status: DC

## 2021-04-20 ENCOUNTER — Other Ambulatory Visit: Payer: Self-pay | Admitting: Internal Medicine

## 2021-04-20 DIAGNOSIS — E782 Mixed hyperlipidemia: Secondary | ICD-10-CM

## 2021-04-20 LAB — LIPID PANEL
Cholesterol: 227 mg/dL — ABNORMAL HIGH (ref <200)
HDL: 79 mg/dL (ref >=50)
LDL Cholesterol (Calc): 131 mg/dL (calc) — ABNORMAL HIGH
Non-HDL Cholesterol (Calc): 148 mg/dL (calc) — ABNORMAL HIGH (ref <130)
Total CHOL/HDL Ratio: 2.9 (calc) (ref <5.0)
Triglycerides: 74 mg/dL (ref <150)

## 2021-04-20 LAB — HEMOGLOBIN A1C
Hgb A1c MFr Bld: 5.5 % of total Hgb (ref <5.7)
Mean Plasma Glucose: 111 mg/dL
eAG (mmol/L): 6.2 mmol/L

## 2021-04-20 LAB — MAGNESIUM: Magnesium: 2.2 mg/dL (ref 1.5–2.5)

## 2021-04-20 LAB — CBC WITH DIFFERENTIAL/PLATELET
Absolute Monocytes: 331 cells/uL (ref 200–950)
Basophils Absolute: 18 cells/uL (ref 0–200)
Basophils Relative: 0.4 %
Eosinophils Absolute: 69 cells/uL (ref 15–500)
Eosinophils Relative: 1.5 %
HCT: 41.9 % (ref 35.0–45.0)
Hemoglobin: 14 g/dL (ref 11.7–15.5)
Lymphs Abs: 1835 cells/uL (ref 850–3900)
MCH: 29.8 pg (ref 27.0–33.0)
MCHC: 33.4 g/dL (ref 32.0–36.0)
MCV: 89.1 fL (ref 80.0–100.0)
MPV: 11.2 fL (ref 7.5–12.5)
Monocytes Relative: 7.2 %
Neutro Abs: 2346 cells/uL (ref 1500–7800)
Neutrophils Relative %: 51 %
Platelets: 179 10*3/uL (ref 140–400)
RBC: 4.7 10*6/uL (ref 3.80–5.10)
RDW: 13.1 % (ref 11.0–15.0)
Total Lymphocyte: 39.9 %
WBC: 4.6 10*3/uL (ref 3.8–10.8)

## 2021-04-20 LAB — URINALYSIS, ROUTINE W REFLEX MICROSCOPIC
Bilirubin Urine: NEGATIVE
Glucose, UA: NEGATIVE
Hgb urine dipstick: NEGATIVE
Ketones, ur: NEGATIVE
Leukocytes,Ua: NEGATIVE
Nitrite: NEGATIVE
Protein, ur: NEGATIVE
Specific Gravity, Urine: 1.015 (ref 1.001–1.035)
pH: 7 (ref 5.0–8.0)

## 2021-04-20 LAB — MICROALBUMIN / CREATININE URINE RATIO
Creatinine, Urine: 115 mg/dL (ref 20–275)
Microalb Creat Ratio: 2 mcg/mg creat (ref <30)
Microalb, Ur: 0.2 mg/dL

## 2021-04-20 LAB — COMPLETE METABOLIC PANEL WITH GFR
AG Ratio: 1.7 (calc) (ref 1.0–2.5)
ALT: 8 U/L (ref 6–29)
AST: 12 U/L (ref 10–35)
Albumin: 4 g/dL (ref 3.6–5.1)
Alkaline phosphatase (APISO): 71 U/L (ref 37–153)
BUN: 10 mg/dL (ref 7–25)
CO2: 24 mmol/L (ref 20–32)
Calcium: 9.4 mg/dL (ref 8.6–10.4)
Chloride: 106 mmol/L (ref 98–110)
Creat: 0.79 mg/dL (ref 0.60–1.00)
Globulin: 2.3 g/dL (calc) (ref 1.9–3.7)
Glucose, Bld: 85 mg/dL (ref 65–99)
Potassium: 3.9 mmol/L (ref 3.5–5.3)
Sodium: 141 mmol/L (ref 135–146)
Total Bilirubin: 0.5 mg/dL (ref 0.2–1.2)
Total Protein: 6.3 g/dL (ref 6.1–8.1)
eGFR: 79 mL/min/{1.73_m2} (ref >=60)

## 2021-04-20 LAB — INSULIN, RANDOM: Insulin: 6.1 u[IU]/mL

## 2021-04-20 LAB — VITAMIN D 25 HYDROXY (VIT D DEFICIENCY, FRACTURES): Vit D, 25-Hydroxy: 61 ng/mL (ref 30–100)

## 2021-04-20 LAB — TSH: TSH: 0.41 mIU/L (ref 0.40–4.50)

## 2021-04-20 MED ORDER — NEXLIZET 180-10 MG PO TABS: ORAL_TABLET | ORAL | 3 refills | Status: DC

## 2021-04-20 NOTE — Progress Notes (Signed)
============================================================ -   Test results slightly outside the reference range are not unusual. If there is anything important, I will review this with you,  otherwise it is considered normal test values.  If you have further questions,  please do not hesitate to contact me at the office or via My Chart.  ============================================================ ============================================================  -  Total Chol = 227 - Very elevated           (  Ideal or Goal is less than 180  !  ) &  - Bad / Dangerous LDL Chol = 131 - Also way too high            (  Ideal or Goal is less than 70  !  )  - Since can't rake Statin Meds , Sent in new Rx for Nexlizet for Cholesterol  To prevent you from having                     a Stroke, Heart Attack , Kidney Failure or developing Dementia  ============================================================ ============================================================  -  All Else - CBC - Kidneys - Electrolytes - Liver - Magnesium & Thyroid    - all  Normal / OK ============================================================ ============================================================

## 2021-04-22 ENCOUNTER — Encounter: Payer: Self-pay | Admitting: Internal Medicine

## 2021-06-22 ENCOUNTER — Other Ambulatory Visit: Payer: Self-pay | Admitting: Internal Medicine

## 2021-06-22 MED ORDER — DEXAMETHASONE 4 MG PO TABS: ORAL_TABLET | ORAL | 0 refills | Status: DC

## 2021-08-28 ENCOUNTER — Other Ambulatory Visit: Payer: Self-pay | Admitting: Internal Medicine

## 2021-08-28 DIAGNOSIS — L820 Inflamed seborrheic keratosis: Secondary | ICD-10-CM

## 2021-08-28 MED ORDER — TRIAMCINOLONE ACETONIDE 0.5 % EX OINT: 1.0000 "application " | TOPICAL_OINTMENT | Freq: Two times a day (BID) | CUTANEOUS | 3 refills | Status: DC

## 2021-12-04 ENCOUNTER — Ambulatory Visit: Payer: Medicare Other | Admitting: Adult Health

## 2022-01-04 ENCOUNTER — Encounter: Payer: Self-pay | Admitting: Internal Medicine

## 2022-02-13 ENCOUNTER — Ambulatory Visit: Payer: Medicare Other | Admitting: Adult Health

## 2022-02-27 ENCOUNTER — Ambulatory Visit (INDEPENDENT_AMBULATORY_CARE_PROVIDER_SITE_OTHER): Payer: Medicare Other | Admitting: Internal Medicine

## 2022-02-27 ENCOUNTER — Encounter: Payer: Self-pay | Admitting: Internal Medicine

## 2022-02-27 VITALS — BP 138/72 | HR 63 | Temp 96.8°F | Resp 16 | Ht 62.5 in | Wt 135.4 lb

## 2022-02-27 DIAGNOSIS — E782 Mixed hyperlipidemia: Secondary | ICD-10-CM

## 2022-02-27 DIAGNOSIS — E559 Vitamin D deficiency, unspecified: Secondary | ICD-10-CM

## 2022-02-27 DIAGNOSIS — R0989 Other specified symptoms and signs involving the circulatory and respiratory systems: Secondary | ICD-10-CM

## 2022-02-27 DIAGNOSIS — G720 Drug-induced myopathy: Secondary | ICD-10-CM

## 2022-02-27 DIAGNOSIS — R4189 Other symptoms and signs involving cognitive functions and awareness: Secondary | ICD-10-CM

## 2022-02-27 DIAGNOSIS — I700 Atherosclerosis of aorta: Secondary | ICD-10-CM

## 2022-02-27 DIAGNOSIS — Z87891 Personal history of nicotine dependence: Secondary | ICD-10-CM

## 2022-02-27 DIAGNOSIS — Z79899 Other long term (current) drug therapy: Secondary | ICD-10-CM

## 2022-02-27 DIAGNOSIS — D518 Other vitamin B12 deficiency anemias: Secondary | ICD-10-CM

## 2022-02-27 DIAGNOSIS — R7309 Other abnormal glucose: Secondary | ICD-10-CM | POA: Diagnosis not present

## 2022-02-27 NOTE — Patient Instructions (Signed)
Mild Neurocognitive Disorder Mild neurocognitive disorder, formerly known as mild cognitive impairment, is a disorder in which memory does not work as well as it should. This disorder may also cause problems with other mental functions, including thought, communication, behavior, and completion of tasks. These problems can be noticed and measured, but they usually do not interfere with daily activities or the ability to live independently. Mild neurocognitive disorder typically develops after 73 years of age, but it can also develop at younger ages. It is not as serious as major neurocognitive disorder, also known as dementia, but it may be the first sign of it. Generally, symptoms of this condition get worse over time. In rare cases, symptoms can get better. What are the causes? This condition may be caused by: Brain disorders like Alzheimer's disease, Parkinson's disease, and other conditions that gradually damage nerve cells (neurodegenerative conditions). Diseases that affect blood vessels in the brain and result in small strokes. Certain infections, such as HIV. Traumatic brain injury. Other medical conditions, such as brain tumors, underactive thyroid (hypothyroidism), and vitamin B12 deficiency. Use of certain drugs or prescription medicines. What increases the risk? The following factors may make you more likely to develop this condition: Being older than 65 years. Being female. Low education level. Diabetes, high blood pressure, high cholesterol, and other conditions that increase the risk for blood vessel diseases. Untreated or undertreated sleep apnea. Having a certain type of gene that can be passed from parent to child (inherited). Chronic health problems such as heart disease, lung disease, liver disease, kidney disease, or depression. What are the signs or symptoms? Symptoms of this condition include: Difficulty remembering. You may: Forget names, phone numbers, or details of  recent events. Forget social events and appointments. Repeatedly forget where you put your car keys or other items. Difficulty thinking and solving problems. You may have trouble with complex tasks, such as: Paying bills. Driving in unfamiliar places. Difficulty communicating. You may have trouble: Finding the right word or naming an object. Forming a sentence that makes sense, or understanding what you read or hear. Changes in your behavior or personality. When this happens, you may: Lose interest in the things that you used to enjoy. Withdraw from social situations. Get angry more easily than usual. Act before thinking. How is this diagnosed? This condition is diagnosed based on: Your symptoms. Your health care provider may ask you and the people you spend time with, such as family and friends, about your symptoms. Evaluation of mental functions (neuropsychological testing). Your health care provider may refer you to a neurologist or mental health specialist to evaluate your mental functions in detail. To identify the cause of your condition, your health care provider may: Get a detailed medical history. Ask about use of alcohol, drugs, and prescription medicines. Do a physical exam. Order blood tests and brain imaging exams. How is this treated? Mild neurocognitive disorder that is caused by medicine use, drug use, infection, or another medical condition may improve when the cause is treated, or when medicines or drugs are stopped. If this disorder has another cause, it generally does not improve and may get worse. In these cases, the goal of treatment is to help you manage the loss of mental function. Treatments in these cases include: Medicine. Medicine mainly helps memory and behavior symptoms. Talk therapy. Talk therapy provides education, emotional support, memory aids, and other ways of making up for problems with mental function. Lifestyle changes, including: Getting regular  exercise. Eating a healthy diet  that includes omega-3 fatty acids. Challenging your thinking and memory skills. Having more social interaction. Follow these instructions at home: Eating and drinking Drink enough fluid to keep your urine pale yellow. Eat a healthy diet that includes omega-3 fatty acids. These can be found in: Fish. Nuts. Leafy vegetables. Vegetable oils. If you drink alcohol: Limit how much you use to: 0-1 drink a day for women. 0-2 drinks a day for men. Be aware of how much alcohol is in your drink. In the [place] one drink equals one 12 oz bottle of beer (355 mL), one 5 oz glass of wine (148 mL), or one 1 oz glass of hard liquor (44 mL). Lifestyle Get regular exercise as told by your health care provider. Do not use any products that contain nicotine or tobacco, such as cigarettes, e-cigarettes, and chewing tobacco. If you need help quitting, ask your health care provider. Practice ways to manage stress. If you need help managing stress, ask your health care provider. Continue to have social interaction. Keep your mind active with stimulating activities you enjoy, such as reading or playing games. Make sure to get quality sleep. Follow these tips: Avoid napping during the day. Keep your sleeping area dark and cool. Avoid exercising during the few hours before you go to bed. Avoid caffeine products in the evening. General instructions Take over-the-counter and prescription medicines only as told by your health care provider. Your health care provider may recommend that you avoid taking medicines that can affect thinking, such as pain medicines or sleep medicines. Work with your health care provider to find out what you need help with and what your safety needs are. Keep all follow-up visits. This is important. Where to find more information [employer] on Aging: http://kim-miller.com/ Contact a health care provider if: You have any new symptoms. Get help right away  if: You develop new confusion or your confusion gets worse. You act in ways that place you or your family in danger. Summary Mild neurocognitive disorder is a disorder in which memory does not work as well as it should. Mild neurocognitive disorder can have many causes. It may be the first stage of dementia. To manage your condition, get regular exercise, keep your mind active, get quality sleep, and eat a healthy diet. This information is not intended to replace advice given to you by your health care provider. Make sure you discuss any questions you have with your health care provider. ============================================ Dementia Dementia is a condition that affects the way the brain functions. It often affects memory and thinking. Usually, dementia gets worse with time and cannot be reversed (progressive dementia). There are many types of dementia, including: Alzheimer's disease. This type is the most common. Vascular dementia. This type may happen as the result of a stroke. Lewy body dementia. This type may happen to people who have Parkinson's disease. Frontotemporal dementia. This type is caused by damage to nerve cells (neurons) in certain parts of the brain. Some people may be affected by more than one type of dementia. This is called mixed dementia. What are the causes? Dementia is caused by damage to cells in the brain. The area of the brain and the types of cells damaged determine the type of dementia. Usually, this damage is irreversible or cannot be undone. Some examples of irreversible causes include: Conditions that affect the blood vessels of the brain, such as diabetes, heart disease, or blood vessel disease. Genetic mutations. In some cases, changes in the brain may  be caused by another condition and can be reversed or slowed. Some examples of reversible causes include: Injury to the brain. Certain medicines. Infection, such as meningitis. Metabolic problems, such  as vitamin B12 deficiency or thyroid disease. Pressure on the brain, such as from a tumor, blood clot, or too much fluid in the brain (hydrocephalus). Autoimmune diseases that affect the brain or arteries, such as limbic encephalitis or vasculitis. What are the signs or symptoms? Symptoms of dementia depend on the type of dementia. Common signs of dementia include problems with remembering, thinking, problem solving, decision making, and communicating. These signs develop slowly or get worse with time. This may include: Problems remembering events or people. Having trouble taking a bath or putting clothes on. Forgetting appointments or forgetting to pay bills. Difficulty planning and preparing meals. Having trouble speaking. Getting lost easily. Changes in behavior or mood. How is this diagnosed? This condition is diagnosed by a specialist (neurologist). It is diagnosed based on the history of your symptoms, your medical history, a physical exam, and tests. Tests may include: Tests to evaluate brain function, such as memory tests, cognitive tests, and other tests. Lab tests, such as blood or urine tests. Imaging tests, such as a CT scan, a PET scan, or an MRI. Genetic testing. This may be done if other family members have a diagnosis of certain types of dementia. Your health care provider will talk with you and your family, friends, or caregivers about your history and symptoms. How is this treated? Treatment for this condition depends on the cause of the dementia. Progressive dementias, such as Alzheimer's disease, cannot be cured, but there may be treatments that help to manage symptoms. Treatment might involve taking medicines that may help to: Control the dementia. Slow down the progression of the dementia. Manage symptoms. In some cases, treating the cause of your dementia can improve symptoms, reverse symptoms, or slow down how quickly your dementia becomes worse. Your health care  provider can direct you to support groups, organizations, and other health care providers who can help with decisions about your care. Follow these instructions at home: Medicines Take over-the-counter and prescription medicines only as told by your health care provider. Use a pill organizer or pill reminder to help you manage your medicines. Avoid taking medicines that can affect thinking, such as pain medicines or sleeping medicines. Lifestyle Make healthy lifestyle choices. Be physically active as told by your health care provider. Do not use any products that contain nicotine or tobacco, such as cigarettes, e-cigarettes, and chewing tobacco. If you need help quitting, ask your health care provider. Do not drink alcohol. Practice stress-management techniques when you get stressed. Spend time with other people. Make sure to get quality sleep. These tips can help you get a good night's rest: Avoid napping during the day. Keep your sleeping area dark and cool. Avoid exercising during the few hours before you go to bed. Avoid caffeine products in the evening. Eating and drinking Drink enough fluid to keep your urine pale yellow. Eat a healthy diet. General instructions  Work with your health care provider to determine what you need help with and what your safety needs are. Talk with your health care provider about whether it is safe for you to drive. If you were given a bracelet that identifies you as a person with memory loss or tracks your location, make sure to wear it at all times. Work with your family to make important decisions, such as advance directives, medical  power of attorney, or a living will. Keep all follow-up visits. This is important. Where to find more information Alzheimer's Association: CapitalMile.co.nz National Institute on Aging: DVDEnthusiasts.nl World Health Organization: RoleLink.com.br Contact a health care provider if: You have any new or worsening  symptoms. You have problems with choking or swallowing. Get help right away if: You feel depressed or sad, or feel that you want to harm yourself. Your family members become concerned for your safety. If you ever feel like you may hurt yourself or others, or have thoughts about taking your own life, get help right away. Go to your nearest emergency department or: Call your local emergency services (911 in the [place] Call a suicide crisis helpline, such as the [facility] at 480-555-8629 or 988 in the [place]. This is open 24 hours a day in the [place] Text the Crisis Text Line at 519-579-6962 (in the [place].). Summary Dementia is a condition that affects the way the brain functions. Dementia often affects memory and thinking. Usually, dementia gets worse with time and cannot be reversed (progressive dementia). Treatment for this condition depends on the cause of the dementia. Work with your health care provider to determine what you need help with and what your safety needs are. Your health care provider can direct you to support groups, organizations, and other health care providers who can help with decisions about your care.  ================================== Dementia Caregiver Guide  Dementia is a term used to describe a number of symptoms that affect memory and thinking. The most common symptoms include: Memory loss. Trouble with language and communication. Trouble concentrating. Poor judgment and problems with reasoning. Wandering from home or public places. Extreme anxiety or depression. Being suspicious or having angry outbursts and accusations. Child-like behavior and language. Dementia can be frightening and confusing. And taking care of someone with dementia can be challenging. This guide provides tips to help you when providing care for a person with dementia. How to help manage lifestyle changes Dementia usually gets worse slowly over time. In the early stages,  people with dementia can stay independent and safe with some help. In later stages, they need help with daily tasks such as dressing, grooming, and using the bathroom. There are actions you can take to help a person manage his or her life while living with this condition. Communicating When the person is talking or seems frustrated, make eye contact and hold the person's hand. Ask specific questions that need yes or no answers. Use simple words, short sentences, and a calm voice. Only give one direction at a time. When offering choices, limit the person to just one or two. Avoid correcting the person in a negative way. If the person is struggling to find the right words, gently try to help him or her. Preventing injury  Keep floors clear of clutter. Remove rugs, magazine racks, and floor lamps. Keep hallways well lit, especially at night. Put a handrail and nonslip mat in the bathtub or shower. Put childproof locks on cabinets that contain dangerous items, such as medicines, alcohol, guns, toxic cleaning items, sharp tools or utensils, matches, and lighters. For doors to the outside of the house, put the locks in places where the person cannot see or reach them easily. This will help ensure that the person does not wander out of the house and get lost. Be prepared for emergencies. Keep a list of emergency phone numbers and addresses in a convenient area. Remove car keys and lock garage doors so  that the person does not try to get in the car and drive. Have the person wear a bracelet that tracks locations and identifies the person as having memory problems. This should be worn at all times for safety. Helping with daily life  Keep the person on track with his or her routine. Try to identify areas where the person may need help. Be supportive, patient, calm, and encouraging. Gently remind the person that adjusting to changes takes time. Help with the tasks that the person has asked for help  with. Keep the person involved in daily tasks and decisions as much as possible. Encourage conversation, but try not to get frustrated if the person struggles to find words or does not seem to appreciate your help. How to recognize stress Look for signs of stress in yourself and in the person you are caring for. If you notice signs of stress, take steps to manage it. Symptoms of stress include: Feeling anxious, irritable, frustrated, or angry. Denying that the person has dementia or that his or her symptoms will not improve. Feeling depressed, hopeless, or unappreciated. Difficulty sleeping. Difficulty concentrating. Developing stress-related health problems. Feeling like you have too little time for your own life. Follow these instructions at home: Take care of your health Make sure that you and the person you are caring for: Get regular sleep. Exercise regularly. Eat regular, nutritious meals. Take over-the-counter and prescription medicines only as told by your health care providers. Drink enough fluid to keep your urine pale yellow. Attend all scheduled health care appointments.  General instructions Join a support group with others who are caregivers. Ask about respite care resources. Respite care can provide short-term care for the person so that you can have a regular break from the stress of caregiving. Consider any safety risks and take steps to avoid them. Organize medicines in a pill box for each day of the week. Create a plan to handle any legal or financial matters. Get legal or financial advice if needed. Keep a calendar in a central location to remind the person of appointments or other activities. Where to find support: Many individuals and organizations offer support. These include: Support groups for people with dementia. Support groups for caregivers. Counselors or therapists. Home health care services. Adult day care centers. Where to find more  information Centers for Disease Control and Prevention: http://www.wolf.info/ Alzheimer's Association: CapitalMile.co.nz Family Caregiver Alliance: www.caregiver.[place]: www.alzfdn.org Contact a health care provider if: The person's health is rapidly getting worse. You are no longer able to care for the person. Caring for the person is affecting your physical and emotional health. You are feeling depressed or anxious about caring for the person. Get help right away if: The person threatens himself or herself, you, or anyone else. You feel depressed or sad, or feel that you want to harm yourself. If you ever feel like your loved one may hurt himself or herself or others, or if he or she shares thoughts about taking his or her own life, get help right away. You can go to your nearest emergency department or: Call your local emergency services (911 in the [place] Call a suicide crisis helpline, such as the [facility] at (401)411-9181 or 988 in the [place]. This is open 24 hours a day in the [place] Text the Crisis Text Line at 718-001-3798 (in the [place].). Summary Dementia is a term used to describe a number of symptoms that affect memory and thinking. Dementia usually  gets worse slowly over time. Take steps to reduce the person's risk of injury and to plan for future care. Caregivers need support, relief from caregiving, and time for their own lives.

## 2022-02-27 NOTE — Progress Notes (Signed)
History of Present Illness:                                                    This very nice 71 y.o. WWF presents for follow up with HTN, HLD, Pre-Diabetes and Vitamin D Deficiency.  Patient has moved to [place] to be near her daughter& her grandchildren in [place] & recently has moved into a facility . Daughter has concerns of her short term recall.                                                           Patient  has hx/o Labile HTN circa 2009 , & has been off meds.  Today's BP is at goal - 138/72. Patient has had no complaints of any cardiac type chest pain, palpitations, dyspnea / orthopnea / PND, dizziness, claudication, or dependent edema. CXR in Mar 2019 showed Aortic Atherosclerosis.                                                          Hyperlipidemia is not controlled with diet /meds as patient alleges intolerance to Statins & Zetia.   Current Lipids are not at goal :  Lab Results  Component Value Date   CHOL 236 (H) 02/27/2022   HDL 86 02/27/2022   LDLCALC 131 (H) 02/27/2022   LDLDIRECT 139.2 04/29/2012   TRIG 90 02/27/2022   CHOLHDL 2.7 02/27/2022     Also, the patient has history of PreDiabetes and has had no symptoms of reactive hypoglycemia, diabetic polys, paresthesias or visual blurring.  Current A1c is at goal :  Lab Results  Component Value Date   HGBA1C 5.4 02/27/2022                                                         Further, the patient also has history of Vitamin D Deficiency and supplements vitamin D without any suspected side-effects. Current vitamin D was at goal (70-100) :  Lab Results  Component Value Date   VD25OH 41 02/27/2022     Current Outpatient Medications on File Prior to Visit  Medication Sig   NEXLIZET  180-10 MG    - NOT TAKING    VITAMIN D   - NOT TAKING    LEXAPRO 10 MG tablet   - NOT TAKING    ibuprofen  200 MG tablet - NOT TAKING    Multiple Vitamin  - NOT TAKING      Allergies  Allergen Reactions   Crestor  [Rosuvastatin Calcium] Other (See Comments)    myalgias   Red Yeast Rice [Cholestin] Other (See Comments)    Dizzy   Statins     Reports has tried numerous, all with severe myalgias   Tape Other (See Comments)  Pt states that skin burns, and it hurts severely.   Aleve [Naproxen Sodium] Rash   Darvon [Propoxyphene] Nausea And Vomiting and Anxiety   Latex Rash    Contact makes the skin feel "burned"     PMHx:   Past Medical History:  Diagnosis Date   Anxiety    DCIS (ductal carcinoma in situ) of breast 04/29/2012   HTN (hypertension)    Hyperlipidemia    Prediabetes 09/07/2013   Vitamin D deficiency    Immunization History  Administered Date(s) Administered   Influenza, High Dose 07/15/2018, 06/08/2019, 06/15/2020   PFIZER-SARS-COV-2 Vacc 09/14/2019, 10/05/2019   PPD Test 10/18/2013   Pneumococcal -13 05/05/2014   Pneumococcal -23 10/18/2014   Tdap 10/18/2014   Zoster, Live 11/06/2009   Past Surgical History:  Procedure Laterality Date   BREAST LUMPECTOMY  09/2009   [facility]   CESAREAN SECTION     LAMINECTOMY Right 07/18/2014   Procedure: Right Lumbar Four to Five Laminectomy for Synovial cyst;  Surgeon: Kristeen Miss, MD;  Location: [facility];  Service: Neurosurgery;  Laterality: Right;  Right L4-5 Laminectomy for Synovial cyst   LUMBAR DISC SURGERY     [facility]   OPEN REDUCTION INTERNAL FIXATION (ORIF) DISTAL RADIAL FRACTURE Left 09/23/2018   Procedure: OPEN REDUCTION INTERNAL FIXATION (ORIF) DISTAL RADIAL FRACTURE;  Surgeon: Iran Planas, MD;  Location: [facility];  Service: Orthopedics;  Laterality: Left;   TONSILLECTOMY AND ADENOIDECTOMY  1960   TUBAL LIGATION      FHx:    Reviewed / unchanged  SHx:    Reviewed / unchanged   Systems Review:  Constitutional: Denies fever, chills, wt changes, headaches, insomnia, fatigue, night sweats, change in appetite. Eyes: Denies redness, blurred vision, diplopia, discharge, itchy, watery eyes.  ENT: Denies  discharge, congestion, post nasal drip, epistaxis, sore throat, earache, hearing loss, dental pain, tinnitus, vertigo, sinus pain, snoring.  CV: Denies chest pain, palpitations, irregular heartbeat, syncope, dyspnea, diaphoresis, orthopnea, PND, claudication or edema. Respiratory: denies cough, dyspnea, DOE, pleurisy, hoarseness, laryngitis, wheezing.  Gastrointestinal: Denies dysphagia, odynophagia, heartburn, reflux, water brash, abdominal pain or cramps, nausea, vomiting, bloating, diarrhea, constipation, hematemesis, melena, hematochezia  or hemorrhoids. Genitourinary: Denies dysuria, frequency, urgency, nocturia, hesitancy, discharge, hematuria or flank pain. Musculoskeletal: Denies arthralgias, myalgias, stiffness, jt. swelling, pain, limping or strain/sprain.  Skin: Denies pruritus, rash, hives, warts, acne, eczema or change in skin lesion(s). Neuro: No weakness, tremor, incoordination, spasms, paresthesia or pain. Psychiatric: Denies confusion, memory loss or sensory loss. Endo: Denies change in weight, skin or hair change.  Heme/Lymph: No excessive bleeding, bruising or enlarged lymph nodes.  Physical Exam  BP 138/72   Pulse 63   Temp (!) 96.8 F (36 C)   Resp 16   Wt 135 lb 6.4 oz (61.4 kg)   SpO2 99%   BMI 24.37 kg/m   Appears  well nourished, well groomed  and in no distress.  Eyes: PERRLA, EOMs, conjunctiva no swelling or erythema. Sinuses: No frontal/maxillary tenderness ENT/Mouth: EAC's clear, TM's nl w/o erythema, bulging. Nares clear w/o erythema, swelling, exudates. Oropharynx clear without erythema or exudates. Oral hygiene is good. Tongue normal, non obstructing. Hearing intact.  Neck: Supple. Thyroid not palpable. Car 2+/2+ without bruits, nodes or JVD. Chest: Respirations nl with BS clear & equal w/o rales, rhonchi, wheezing or stridor.  Cor: Heart sounds normal w/ regular rate and rhythm without sig. murmurs, gallops, clicks or rubs. Peripheral pulses normal and  equal  without edema.  Abdomen: Soft &  bowel sounds normal. Non-tender w/o guarding, rebound, hernias, masses or organomegaly.  Lymphatics: Unremarkable.  Musculoskeletal: Full ROM all peripheral extremities, joint stability, 5/5 strength and normal gait.  Skin: Warm, dry without exposed rashes, lesions or ecchymosis apparent.  Neuro: Cranial nerves intact, reflexes equal bilaterally. Sensory-motor testing grossly intact. Tendon reflexes grossly intact.  Pysch: Alert & oriented x 3.  Insight and judgement nl & appropriate. No ideations.  Assessment and Plan:  1. Labile hypertension  - Continue medication, monitor blood pressure at home.  - Continue DASH diet.  Reminder to go to the ER if any CP,  SOB, nausea, dizziness, severe HA, changes vision/speech.    - CBC with Differential/Platelet - COMPLETE METABOLIC PANEL WITH GFR - Magnesium - TSH  2. Hyperlipidemia, mixed  - Continue diet/meds, exercise,& lifestyle modifications.  - Continue monitor periodic cholesterol/liver & renal functions     - Lipid panel - TSH  3. Statin myopathy  - Continue diet, exercise  - Lifestyle modifications.  - Monitor appropriate labs   - Lipid panel  4. Abnormal glucose  - Continue supplementation    - Hemoglobin A1c - Insulin, random  5. Vitamin D deficiency  - VITAMIN D 25 Hydroxy   6. Aortic atherosclerosis ([place]) by CXR in 10/2017  - Lipid panel  7. Cognitive decline  - TSH - Vitamin B12  8. Former smoker   27. Vitamin B12 deficiency   - Vitamin B12  10. Medication management  - CBC with Differential/Platelet - COMPLETE METABOLIC PANEL WITH GFR - Magnesium - Lipid panel - TSH - Hemoglobin A1c - Insulin, random - VITAMIN D 25 Hydroxy  - Vitamin B12       Discussed  regular exercise, BP monitoring, weight control to achieve/maintain BMI less than 25 and discussed med and SE's. Recommended labs to assess and monitor clinical status with further disposition  pending results of labs.  I discussed the assessment and treatment plan with the patient. The patient was provided an opportunity to ask questions and all were answered. The patient agreed with the plan and demonstrated an understanding of the instructions.  I provided over 30 minutes of exam, counseling, chart review and  complex critical decision making.  Kirtland Bouchard, MD

## 2022-02-28 ENCOUNTER — Telehealth: Payer: Self-pay

## 2022-02-28 DIAGNOSIS — E782 Mixed hyperlipidemia: Secondary | ICD-10-CM

## 2022-02-28 LAB — COMPLETE METABOLIC PANEL WITH GFR
AG Ratio: 1.6 (calc) (ref 1.0–2.5)
ALT: 8 U/L (ref 6–29)
AST: 13 U/L (ref 10–35)
Albumin: 4 g/dL (ref 3.6–5.1)
Alkaline phosphatase (APISO): 78 U/L (ref 37–153)
BUN: 9 mg/dL (ref 7–25)
CO2: 26 mmol/L (ref 20–32)
Calcium: 9.4 mg/dL (ref 8.6–10.4)
Chloride: 106 mmol/L (ref 98–110)
Creat: 0.77 mg/dL (ref 0.60–1.00)
Globulin: 2.5 g/dL (calc) (ref 1.9–3.7)
Glucose, Bld: 117 mg/dL — ABNORMAL HIGH (ref 65–99)
Potassium: 3.7 mmol/L (ref 3.5–5.3)
Sodium: 142 mmol/L (ref 135–146)
Total Bilirubin: 0.5 mg/dL (ref 0.2–1.2)
Total Protein: 6.5 g/dL (ref 6.1–8.1)
eGFR: 81 mL/min/{1.73_m2} (ref >=60)

## 2022-02-28 LAB — CBC WITH DIFFERENTIAL/PLATELET
Absolute Monocytes: 427 cells/uL (ref 200–950)
Basophils Absolute: 32 cells/uL (ref 0–200)
Basophils Relative: 0.6 %
Eosinophils Absolute: 92 cells/uL (ref 15–500)
Eosinophils Relative: 1.7 %
HCT: 42.6 % (ref 35.0–45.0)
Hemoglobin: 14.3 g/dL (ref 11.7–15.5)
Lymphs Abs: 2203 cells/uL (ref 850–3900)
MCH: 30.8 pg (ref 27.0–33.0)
MCHC: 33.6 g/dL (ref 32.0–36.0)
MCV: 91.6 fL (ref 80.0–100.0)
MPV: 11.7 fL (ref 7.5–12.5)
Monocytes Relative: 7.9 %
Neutro Abs: 2646 cells/uL (ref 1500–7800)
Neutrophils Relative %: 49 %
Platelets: 187 10*3/uL (ref 140–400)
RBC: 4.65 10*6/uL (ref 3.80–5.10)
RDW: 12.5 % (ref 11.0–15.0)
Total Lymphocyte: 40.8 %
WBC: 5.4 10*3/uL (ref 3.8–10.8)

## 2022-02-28 LAB — LIPID PANEL
Cholesterol: 236 mg/dL — ABNORMAL HIGH (ref <200)
HDL: 86 mg/dL (ref >=50)
LDL Cholesterol (Calc): 131 mg/dL (calc) — ABNORMAL HIGH
Non-HDL Cholesterol (Calc): 150 mg/dL (calc) — ABNORMAL HIGH (ref <130)
Total CHOL/HDL Ratio: 2.7 (calc) (ref <5.0)
Triglycerides: 90 mg/dL (ref <150)

## 2022-02-28 LAB — MAGNESIUM: Magnesium: 2 mg/dL (ref 1.5–2.5)

## 2022-02-28 LAB — HEMOGLOBIN A1C
Hgb A1c MFr Bld: 5.4 % of total Hgb (ref <5.7)
Mean Plasma Glucose: 108 mg/dL
eAG (mmol/L): 6 mmol/L

## 2022-02-28 LAB — VITAMIN D 25 HYDROXY (VIT D DEFICIENCY, FRACTURES): Vit D, 25-Hydroxy: 41 ng/mL (ref 30–100)

## 2022-02-28 LAB — VITAMIN B12: Vitamin B-12: 167 pg/mL — ABNORMAL LOW (ref 200–1100)

## 2022-02-28 LAB — INSULIN, RANDOM: Insulin: 21.9 u[IU]/mL — ABNORMAL HIGH

## 2022-02-28 LAB — TSH: TSH: 0.72 mIU/L (ref 0.40–4.50)

## 2022-02-28 NOTE — Progress Notes (Signed)
<><><><><><><><><><><><><><><><><><><><><><><><><><><><><><><><><> <><><><><><><><><><><><><><><><><><><><><><><><><><><><><><><><><> -   Test results slightly outside the reference range are not unusual. If there is anything important, I will review this with you,  otherwise it is considered normal test values.  If you have further questions,  please do not hesitate to contact me at the office or via My Chart.  <><><><><><><><><><><><><><><><><><><><><><><><><><><><><><><><><> <><><><><><><><><><><><><><><><><><><><><><><><><><><><><><><><><>  -  CBC & Thyroid is Normal & OK  <><><><><><><><><><><><><><><><><><><><><><><><><><><><><><><><><>  -  A1c - Normal - No Diabetes - Great ! <><><><><><><><><><><><><><><><><><><><><><><><><><><><><><><><><>  -  Vitamin D = 41 is very low   - Vitamin D goal is between 70-100.   - Please INCREASE your Vitamin D 5,000 up to 2 capsules = 10,000 units / Daily   - It is very important as a natural anti-inflammatory and helping the  immune system protect against viral infections, like the Covid-19   helping hair, skin, and nails, as well as reducing stroke and  heart attack risk.   - It helps your bones and helps with mood.  - It also decreases numerous cancer risks so please  take it as directed.   - Low Vit D is associated with a 200-300% higher risk for  CANCER   and 200-300% higher risk for HEART   ATTACK  &  STROKE.    - It is also associated with higher death rate at younger ages,   autoimmune diseases like Rheumatoid arthritis, Lupus,  Multiple Sclerosis.     - Also many other serious conditions, like depression,   Alzheimer's  Dementia,  Memory Issues  !   infertility, muscle aches, fatigue, fibromyalgia  <><><><><><><><><><><><><><><><><><><><><><><><><><><><><><><><><>  -  Thyroid is  Normal & OK  <><><><><><><><><><><><><><><><><><><><><><><><><><><><><><><><><>   -  Vitamin B12 = 167   is   Very Low                                          (Ideal or Goal Vit B12 is between 450 - 1,100)     Low Vit B12 may be associated with Anemia , Fatigue,   Peripheral Neuropathy, Dementia, "Brain Fog", & Depression  - Recommend take a sub-lingual form of Vitamin B12 tablet   1,000 to 5,000 mcg tab that you dissolve under your tongue /Daily   - Can get [employer] Sane - best price at [employer] or on [employer]  <><><><><><><><><><><><><><><><><><><><><><><><><><><><><><><><><> <><><><><><><><><><><><><><><><><><><><><><><><><><><><><><><><><>

## 2022-02-28 NOTE — Telephone Encounter (Signed)
-----   Message from Thayer Headings, MD sent at 02/28/2022  2:09 PM EDT ----- Glucose is mildly elevated.   LDL is elevated  She does not tolerate statins or zetia. Lets refer her to the lipid clinic for consideration of PCSK9 inhibitor

## 2022-02-28 NOTE — Telephone Encounter (Signed)
Reviewed results and recommendations with patient. She states that she is willing, but lives a good distance from our office and doesn't drive anymore, but if we can schedule her to come at a time that's convenient, she will try the medication plan.

## 2022-03-03 ENCOUNTER — Encounter: Payer: Self-pay | Admitting: Internal Medicine

## 2022-03-05 NOTE — Progress Notes (Signed)
Unviewed - Please call & send lab letter

## 2022-03-08 ENCOUNTER — Telehealth: Payer: Self-pay

## 2022-03-08 NOTE — Telephone Encounter (Signed)
LVMT: Call to patient reference next PREP class, request call back to discuss

## 2022-04-09 ENCOUNTER — Encounter: Payer: Medicare Other | Admitting: Internal Medicine

## 2022-04-17 ENCOUNTER — Encounter: Payer: Self-pay | Admitting: Cardiovascular Disease

## 2022-04-19 ENCOUNTER — Encounter: Payer: Medicare Other | Admitting: Internal Medicine

## 2022-04-23 ENCOUNTER — Encounter: Payer: Medicare Other | Admitting: Internal Medicine

## 2022-04-24 ENCOUNTER — Encounter: Payer: Medicare Other | Admitting: Internal Medicine

## 2022-05-20 ENCOUNTER — Encounter: Payer: Self-pay | Admitting: Internal Medicine

## 2022-05-21 ENCOUNTER — Encounter: Payer: Self-pay | Admitting: Internal Medicine

## 2022-05-30 ENCOUNTER — Encounter: Payer: Medicare Other | Admitting: Internal Medicine

## 2022-07-16 ENCOUNTER — Encounter: Payer: Medicare Other | Admitting: Internal Medicine

## 2022-08-15 ENCOUNTER — Encounter: Payer: Medicare Other | Admitting: Internal Medicine

## 2022-09-08 ENCOUNTER — Encounter: Payer: Self-pay | Admitting: Internal Medicine

## 2022-09-08 NOTE — Progress Notes (Unsigned)
Annual Screening/Preventative Visit & Comprehensive Evaluation &  Examination   Future Appointments  Date Time Provider Department  09/09/2022  2:00 PM Unk Pinto, MD [facility]  08/18/2023  3:00 PM Unk Pinto, MD [facility]           This very nice 72 y.o. WWF presents for a Screening /Preventative Visit & comprehensive evaluation and management of multiple medical co-morbidities.  Patient has been followed for HTN, HLD, Prediabetes  and Vitamin D Deficiency.  In 2011, patient was treated for CIS of the Lt Breast  by lumpectomy and Radiation. CXR in Mar 2019 showed Aortic Atherosclerosis.   Patient has since moved to [place] to be near her daughter & her grandchildren in [place] &  last year moved into an assisted living  facility . Daughter has concerns of her short term recall.  Patient has hx/o B12 Deficiency ( "167" in July 2023) .          Labile  HTN predates since 2009. Patient's BP has been controlled off  meds at home and patient denies any cardiac symptoms as chest pain, palpitations, shortness of breath, dizziness or ankle swelling. Today's BP is at goal -                             .         Patient  is Statin & Zetia Intolerant and hyperlipidemia is controlled with diet and Nexletol. Patient denies myalgias or other medication SE's. Last lipids were at goal:  Lab Results  Component Value Date   CHOL 152 08/22/2020   HDL 58 08/22/2020   LDLCALC 78 08/22/2020   TRIG 80 08/22/2020   CHOLHDL 2.6 08/22/2020         Patient has hx/o prediabetes (A1c 6.3% /2008 & 5.9% /2016) and patient denies reactive hypoglycemic symptoms, visual blurring, diabetic polys or paresthesias. Last A1c was normal & at goal:  Lab Results  Component Value Date   HGBA1C 5.6 04/06/2020         Finally, patient has history of Vitamin D Deficiency ("24" /2008) and last Vitamin D was near goal  (70-100):  Lab Results  Component Value Date   VD25OH 52 04/06/2020     Current  Outpatient Medications on File Prior to Visit  Medication Sig   NEXLETOL 180 MG TABS Take 1 tablet Daily for Cholesterol   VITAMIN D 5,000 Units  Take daily.    ibuprofen 200 MG tablet Take 400 mg  daily as needed for moderate pain.    Multiple Vitamin  Take  daily.   triamcinolone oint   0  .5 % Apply  2  times daily  to hand eczema      Allergies  Allergen Reactions   Crestor [Rosuvastatin Calcium] myalgias   Red Yeast Rice [Cholestin] Dizzy   Statins severe myalgias   Tape  skin burns   Aleve [Naproxen Sodium] Rash   Darvon [Propoxyphene] Nausea And Vomiting and Anxiety   Latex Rash     Past Medical History:  Diagnosis Date   Anxiety    DCIS (ductal carcinoma in situ) of breast 04/29/2012   HTN (hypertension)    Hyperlipidemia    Prediabetes 09/07/2013   Vitamin D deficiency      Health Maintenance  Topic Date Due   Zoster Vaccines- Shingrix (1 of 2) Never done   COVID-19 Vaccine (3 - Pfizer risk series) 11/02/2019  Fecal DNA (Cologuard)  01/15/2021   MAMMOGRAM  04/04/2021   INFLUENZA VACCINE  04/02/2021   TETANUS/TDAP  10/18/2024   DEXA SCAN  Completed   Hepatitis C Screening  Completed   PNA vac Low Risk Adult  Completed   HPV VACCINES  Aged Out     Immunization History  Administered Date(s) Administered   Influenza, High Dose  07/15/2018, 06/08/2019, 06/15/2020   PFIZER( SARS-COV-2 Vacc 09/14/2019, 10/05/2019   PPD Test 10/18/2013   Pneumococcal -13 05/05/2014   Pneumococcal -23 10/18/2014   Tdap 10/18/2014   Zoster, Live 11/06/2009    Last Colon - 06/23/2005 - Dr Fuller Plan - 65 yr f/u recc - due 2016 - patient declines  Cologard 12/2017 - Negative - Overdue   Last MGM - 04/04/2020   -   Overdue    Past Surgical History:  Procedure Laterality Date   BREAST LUMPECTOMY  09/2009   [place] Right 07/18/2014   Procedure: Right Lumbar Four to Five Laminectomy for Synovial cyst;  Surgeon: Kristeen Miss, MD;  Location:  [facility];  Service: Neurosurgery;  Laterality: Right;  Right L4-5 Laminectomy for Synovial cyst   LUMBAR DISC SURGERY     [facility]   OPEN REDUCTION INTERNAL FIXATION (ORIF) DISTAL RADIAL FRACTURE Left 09/23/2018   Procedure: OPEN REDUCTION INTERNAL FIXATION (ORIF) DISTAL RADIAL FRACTURE;  Surgeon: Iran Planas, MD;  Location: [facility];  Service: Orthopedics;  Laterality: Left;   TONSILLECTOMY AND ADENOIDECTOMY  1960   TUBAL LIGATION       Family History  Problem Relation Age of Onset   Liver disease Mother        d/c at 57, acute liver failure   COPD Father        d/c at 28   Heart attack Brother 27       bypass x 5   Stroke Brother 80       same brother   Alcohol abuse Brother        other brother   Liver disease Brother    Heart attack Sister 98     Social History   Tobacco Use   Smoking status: Former    Packs/day: 1.00    Years: 10.00    Pack years: 10.00    Types: Cigarettes    Quit date: 09/02/2005    Years since quitting: 15.6   Smokeless tobacco: Never  Vaping Use   Vaping Use: Never used  Substance Use Topics   Alcohol use: No   Drug use: No      ROS Constitutional: Denies fever, chills, weight loss/gain, headaches, insomnia,  night sweats, and change in appetite. Does c/o fatigue. Eyes: Denies redness, blurred vision, diplopia, discharge, itchy, watery eyes.  ENT: Denies discharge, congestion, post nasal drip, epistaxis, sore throat, earache, hearing loss, dental pain, Tinnitus, Vertigo, Sinus pain, snoring.  Cardio: Denies chest pain, palpitations, irregular heartbeat, syncope, dyspnea, diaphoresis, orthopnea, PND, claudication, edema Respiratory: denies cough, dyspnea, DOE, pleurisy, hoarseness, laryngitis, wheezing.  Gastrointestinal: Denies dysphagia, heartburn, reflux, water brash, pain, cramps, nausea, vomiting, bloating, diarrhea, constipation, hematemesis, melena, hematochezia, jaundice, hemorrhoids Genitourinary: Denies dysuria,  frequency, urgency, nocturia, hesitancy, discharge, hematuria, flank pain Breast: Breast lumps, nipple discharge, bleeding.  Musculoskeletal: Denies arthralgia, myalgia, stiffness, Jt. Swelling, pain, limp, and strain/sprain. Denies falls. Skin: Denies puritis, rash, hives, warts, acne, eczema, changing in skin lesion Neuro: No weakness, tremor, incoordination, spasms, paresthesia, pain Psychiatric: Denies confusion, memory loss,  sensory loss. Denies Depression. Endocrine: Denies change in weight, skin, hair change, nocturia, and paresthesia, diabetic polys, visual blurring, hyper / hypo glycemic episodes.  Heme/Lymph: No excessive bleeding, bruising, enlarged lymph nodes.  Physical Exam  There were no vitals taken for this visit.  General Appearance: Well nourished, well groomed and in no apparent distress.  Eyes: PERRLA, EOMs, conjunctiva no swelling or erythema, normal fundi and vessels. Sinuses: No frontal/maxillary tenderness ENT/Mouth: EACs patent / TMs  nl. Nares clear without erythema, swelling, mucoid exudates. Oral hygiene is good. No erythema, swelling, or exudate. Tongue normal, non-obstructing. Tonsils not swollen or erythematous. Hearing normal.  Neck: Supple, thyroid not palpable. No bruits, nodes or JVD. Respiratory: Respiratory effort normal.  BS equal and clear bilateral without rales, rhonci, wheezing or stridor. Cardio: Heart sounds are normal with regular rate and rhythm and no murmurs, rubs or gallops. Peripheral pulses are normal and equal bilaterally without edema. No aortic or femoral bruits. Chest: symmetric with normal excursions and percussion. Breasts: Symmetric, without lumps, nipple discharge, retractions, or fibrocystic changes.  Abdomen: Flat, soft with bowel sounds active. Nontender, no guarding, rebound, hernias, masses, or organomegaly.  Lymphatics: Non tender without lymphadenopathy.  Genitourinary:  Musculoskeletal: Full ROM all peripheral extremities,  joint stability, 5/5 strength, and normal gait. Skin: Warm and dry without rashes, lesions, cyanosis, clubbing or  ecchymosis.  Neuro: Cranial nerves intact, reflexes equal bilaterally. Normal muscle tone, no cerebellar symptoms. Sensation intact.  Pysch: Alert and oriented X 3, normal affect, Insight and Judgment appropriate.    Assessment and Plan  1. Annual Preventative Screening Examination   2. Labile hypertension  - EKG 12-Lead - Urinalysis, Routine w reflex microscopic - Microalbumin / creatinine urine ratio - CBC with Differential/Platelet - COMPLETE METABOLIC PANEL WITH GFR - Magnesium - TSH   3. Hyperlipidemia, mixed  - EKG 12-Lead - Lipid panel - TSH     4. Statin myopathy  - Lipid panel   5. Abnormal glucose  - Hemoglobin A1c - Insulin, random   6. Vitamin D deficiency  - VITAMIN D 25 Hydroxy    7. Aortic atherosclerosis ([place]) by CXR in 10/2017  - Lipid panel   8. Cognitive decline  - CBC with Differential/Platelet - TSH   9. Screening for colorectal cancer  - Cologuard  10. Vitamin B12 deficiency (dietary) anemia  - Vitamin B12   11. Screening for heart disease  - EKG 12-Lead   12. Former smoker  - EKG 12-Lead   13. FHx: heart disease  - EKG 12-Lead   14. History of ductal carcinoma in situ (DCIS) of breast   15. Medication management  - Urinalysis, Routine w reflex microscopic - Microalbumin / creatinine urine ratio - Vitamin B12 - CBC with Differential/Platelet - COMPLETE METABOLIC PANEL WITH GFR - Magnesium - Lipid panel - TSH - Hemoglobin A1c - Insulin, random - VITAMIN D 25 Hydroxy            Patient was counseled in prudent diet to achieve/maintain BMI less than 25 for weight control, BP monitoring, regular exercise and medications. Discussed med's effects and SE's. Screening labs and tests as requested with regular follow-up as recommended. Over 40 minutes of exam, counseling, chart review and high  complex critical decision making was performed.   Kirtland Bouchard, MD

## 2022-09-08 NOTE — Patient Instructions (Signed)

## 2022-09-09 ENCOUNTER — Encounter: Payer: Self-pay | Admitting: Internal Medicine

## 2022-09-09 ENCOUNTER — Ambulatory Visit (INDEPENDENT_AMBULATORY_CARE_PROVIDER_SITE_OTHER): Payer: Medicare Other | Admitting: Internal Medicine

## 2022-09-09 VITALS — BP 138/80 | HR 71 | Temp 97.9°F | Resp 16 | Ht 62.5 in | Wt 154.8 lb

## 2022-09-09 DIAGNOSIS — Z0001 Encounter for general adult medical examination with abnormal findings: Secondary | ICD-10-CM

## 2022-09-09 DIAGNOSIS — T466X5A Adverse effect of antihyperlipidemic and antiarteriosclerotic drugs, initial encounter: Secondary | ICD-10-CM

## 2022-09-09 DIAGNOSIS — D518 Other vitamin B12 deficiency anemias: Secondary | ICD-10-CM

## 2022-09-09 DIAGNOSIS — Z136 Encounter for screening for cardiovascular disorders: Secondary | ICD-10-CM

## 2022-09-09 DIAGNOSIS — Z0000 Encounter for general adult medical examination without abnormal findings: Secondary | ICD-10-CM

## 2022-09-09 DIAGNOSIS — R0989 Other specified symptoms and signs involving the circulatory and respiratory systems: Secondary | ICD-10-CM

## 2022-09-09 DIAGNOSIS — R7309 Other abnormal glucose: Secondary | ICD-10-CM

## 2022-09-09 DIAGNOSIS — Z8249 Family history of ischemic heart disease and other diseases of the circulatory system: Secondary | ICD-10-CM

## 2022-09-09 DIAGNOSIS — R4189 Other symptoms and signs involving cognitive functions and awareness: Secondary | ICD-10-CM

## 2022-09-09 DIAGNOSIS — I700 Atherosclerosis of aorta: Secondary | ICD-10-CM

## 2022-09-09 DIAGNOSIS — G720 Drug-induced myopathy: Secondary | ICD-10-CM

## 2022-09-09 DIAGNOSIS — E782 Mixed hyperlipidemia: Secondary | ICD-10-CM

## 2022-09-09 DIAGNOSIS — Z1211 Encounter for screening for malignant neoplasm of colon: Secondary | ICD-10-CM

## 2022-09-09 DIAGNOSIS — Z79899 Other long term (current) drug therapy: Secondary | ICD-10-CM

## 2022-09-09 DIAGNOSIS — E559 Vitamin D deficiency, unspecified: Secondary | ICD-10-CM

## 2022-09-09 DIAGNOSIS — Z86000 Personal history of in-situ neoplasm of breast: Secondary | ICD-10-CM

## 2022-09-09 DIAGNOSIS — Z87891 Personal history of nicotine dependence: Secondary | ICD-10-CM

## 2022-09-09 MED ORDER — HYDROCHLOROTHIAZIDE 25 MG PO TABS: ORAL_TABLET | ORAL | 1 refills | Status: AC

## 2022-09-09 NOTE — Addendum Note (Signed)
Addended by: Unk Pinto on: 09/09/2022 08:12 PM   Modules accepted: Orders

## 2022-09-10 ENCOUNTER — Other Ambulatory Visit: Payer: Self-pay | Admitting: Internal Medicine

## 2022-09-10 DIAGNOSIS — E782 Mixed hyperlipidemia: Secondary | ICD-10-CM

## 2022-09-10 LAB — MICROALBUMIN / CREATININE URINE RATIO
Creatinine, Urine: 158 mg/dL (ref 20–275)
Microalb Creat Ratio: 3 mcg/mg creat (ref <30)
Microalb, Ur: 0.5 mg/dL

## 2022-09-10 LAB — CBC WITH DIFFERENTIAL/PLATELET
Absolute Monocytes: 522 cells/uL (ref 200–950)
Basophils Absolute: 42 cells/uL (ref 0–200)
Basophils Relative: 0.7 %
Eosinophils Absolute: 150 cells/uL (ref 15–500)
Eosinophils Relative: 2.5 %
HCT: 41.7 % (ref 35.0–45.0)
Hemoglobin: 13.9 g/dL (ref 11.7–15.5)
Lymphs Abs: 2394 cells/uL (ref 850–3900)
MCH: 28.6 pg (ref 27.0–33.0)
MCHC: 33.3 g/dL (ref 32.0–36.0)
MCV: 85.8 fL (ref 80.0–100.0)
MPV: 11.3 fL (ref 7.5–12.5)
Monocytes Relative: 8.7 %
Neutro Abs: 2892 cells/uL (ref 1500–7800)
Neutrophils Relative %: 48.2 %
Platelets: 179 10*3/uL (ref 140–400)
RBC: 4.86 10*6/uL (ref 3.80–5.10)
RDW: 12.6 % (ref 11.0–15.0)
Total Lymphocyte: 39.9 %
WBC: 6 10*3/uL (ref 3.8–10.8)

## 2022-09-10 LAB — URINALYSIS, ROUTINE W REFLEX MICROSCOPIC
Bilirubin Urine: NEGATIVE
Glucose, UA: NEGATIVE
Hgb urine dipstick: NEGATIVE
Hyaline Cast: NONE SEEN /LPF
Ketones, ur: NEGATIVE
Nitrite: NEGATIVE
Protein, ur: NEGATIVE
Specific Gravity, Urine: 1.018 (ref 1.001–1.035)
pH: 6.5 (ref 5.0–8.0)

## 2022-09-10 LAB — LIPID PANEL
Cholesterol: 268 mg/dL — ABNORMAL HIGH (ref <200)
HDL: 90 mg/dL (ref >=50)
LDL Cholesterol (Calc): 160 mg/dL (calc) — ABNORMAL HIGH
Non-HDL Cholesterol (Calc): 178 mg/dL (calc) — ABNORMAL HIGH (ref <130)
Total CHOL/HDL Ratio: 3 (calc) (ref <5.0)
Triglycerides: 77 mg/dL (ref <150)

## 2022-09-10 LAB — COMPLETE METABOLIC PANEL WITH GFR
AG Ratio: 1.5 (calc) (ref 1.0–2.5)
ALT: 10 U/L (ref 6–29)
AST: 14 U/L (ref 10–35)
Albumin: 4.1 g/dL (ref 3.6–5.1)
Alkaline phosphatase (APISO): 81 U/L (ref 37–153)
BUN: 10 mg/dL (ref 7–25)
CO2: 25 mmol/L (ref 20–32)
Calcium: 9.6 mg/dL (ref 8.6–10.4)
Chloride: 107 mmol/L (ref 98–110)
Creat: 0.7 mg/dL (ref 0.60–1.00)
Globulin: 2.7 g/dL (calc) (ref 1.9–3.7)
Glucose, Bld: 95 mg/dL (ref 65–99)
Potassium: 3.8 mmol/L (ref 3.5–5.3)
Sodium: 142 mmol/L (ref 135–146)
Total Bilirubin: 0.5 mg/dL (ref 0.2–1.2)
Total Protein: 6.8 g/dL (ref 6.1–8.1)
eGFR: 91 mL/min/{1.73_m2} (ref >=60)

## 2022-09-10 LAB — MICROSCOPIC MESSAGE

## 2022-09-10 LAB — HEMOGLOBIN A1C
Hgb A1c MFr Bld: 5.9 % of total Hgb — ABNORMAL HIGH (ref <5.7)
Mean Plasma Glucose: 123 mg/dL
eAG (mmol/L): 6.8 mmol/L

## 2022-09-10 LAB — VITAMIN B12: Vitamin B-12: 1474 pg/mL — ABNORMAL HIGH (ref 200–1100)

## 2022-09-10 LAB — MAGNESIUM: Magnesium: 2 mg/dL (ref 1.5–2.5)

## 2022-09-10 LAB — INSULIN, RANDOM: Insulin: 7.9 u[IU]/mL

## 2022-09-10 LAB — TSH: TSH: 0.99 mIU/L (ref 0.40–4.50)

## 2022-09-10 LAB — VITAMIN D 25 HYDROXY (VIT D DEFICIENCY, FRACTURES): Vit D, 25-Hydroxy: 22 ng/mL — ABNORMAL LOW (ref 30–100)

## 2022-09-10 MED ORDER — NEXLIZET 180-10 MG PO TABS: ORAL_TABLET | ORAL | 3 refills | Status: AC

## 2022-09-10 NOTE — Progress Notes (Signed)
<><><><><><><><><><><><><><><><><><><><><><><><><><><><><><><><><> <><><><><><><><><><><><><><><><><><><><><><><><><><><><><><><><><> - Test results slightly outside the reference range are not unusual. If there is anything important, I will review this with you,  otherwise it is considered normal test values.  If you have further questions,  please do not hesitate to contact me at the office or via My Chart.  <><><><><><><><><><><><><><><><><><><><><><><><><><><><><><><><><> <><><><><><><><><><><><><><><><><><><><><><><><><><><><><><><><><>  -  Vitamin B12  is very high Now,                                                So can decrease the Vitamin B12 to only take  2 x /week  <><><><><><><><><><><><><><><><><><><><><><><><><><><><><><><><><> <><><><><><><><><><><><><><><><><><><><><><><><><><><><><><><><><>  - Total Chol = 268     is very high risk for Heart Attack /Stroke /Vascular Dementia     ( Ideal or Goal is less than 180 ! )  & - Bad /Dangerous LDL Chol = 160  - - >> Sitting on a time Bomb !     ( Ideal or Goal is less than 70 ! )   - Treating with meds to lower Cholesterol is treating the result                                          & NOT treating the cause  - The cause is Bad Diet !  - But need to go ahead & start meds until get on a better diet to try &                                                           reverse some of the Damage already done  >>>>>>>>>>>>>>>>>>>>>>>>>>>>>>>>>>>>>>>>>>>>>>>>>>>>>>>>>>>>>>>>  - So sent in New Rx for Nexlizet to your  [facility]  in [place] suggest that you have the cholesterol labs rechecked in 3 to 4 months  <<<<<<<<<<<<<<<<<<<<<<<<<<<<<<<<<<<<<<<<<<<<<<<<<<<<<<<<<<<<<<<<  - Read or listen to   Dr Wyman Songster 's book    " How Not to Die ! "    - Recommend a stricter plant based low cholesterol diet   - Cholesterol only comes from animal  sources                                                                 - ie. meat, dairy, egg yolks  - Eat all the vegetables you want.  - Avoid Meat, Avoid Meat , Avoid Meat  ! ! !                                                  -especially red meat - Beef AND Pork  - Avoid cheese & dairy - milk & ice cream.   - Cheese is the most concentrated form of trans-fats which  so    - Avoid Sweets, Candy & White Stuff   - White Rice, White [place], White Flour  - Breads &  Pasta <><><><><><><><><><><><><><><><><><><><><><><><><><><><><><><><><> <><><><><><><><><><><><><><><><><><><><><><><><><><><><><><><><><>  -  Vitamin D = 22 - is Extremely low  - Vitamin D goal is between 70-100.  >>>>>>>>>>>>>>>>>>>>>>>>>>>>>>>>>>>>>>>>>>>>>>>>>>>>>>>>>>>>>>>>>>  - Please take Vitamin D 5,000 unit capsule Daily as discussed  !  <<<<<<<<<<<<<<<<<<<<<<<<<<<<<<<<<<<<<<<<<<<<<<<<<<<<<<<<<<<<<<<<<<  -  Vitamin D is very important as a natural anti-inflammatory and helping the  immune system protect against viral infections, like the Covid-19    helping hair, skin, and nails, as well as reducing stroke and  heart attack risk.   - It helps your bones and helps with mood.  - It also decreases numerous cancer risks so please  take it as directed.   - Low Vit D is associated with a 200-300% higher risk for  CANCER   and 200-300% higher risk for HEART   ATTACK  &  STROKE.    - It is also associated with higher death rate at younger ages,   autoimmune diseases like Rheumatoid arthritis, Lupus, Multiple Sclerosis.     - Also many other serious conditions, like depression,                                                                 Alzheimer's Dementia,                                                                         muscle aches,                                                                                             fatigue,                                                                                                     fibromyalgia  <><><><><><><><><><><><><><><><><><><><><><><><><><><><><><><><><> <><><><><><><><><><><><><><><><><><><><><><><><><><><><><><><><><>  -  All Else - CBC - Kidneys - Electrolytes - Liver - Magnesium & Thyroid    - all  Normal / OK <><><><><><><><><><><><><><><><><><><><><><><><><><><><><><><><><> <><><><><><><><><><><><><><><><><><><><><><><><><><><><><><><><><>                               \

## 2022-09-11 ENCOUNTER — Telehealth: Payer: Self-pay

## 2022-09-11 NOTE — Telephone Encounter (Signed)
Left message for patient to call back  

## 2022-12-24 ENCOUNTER — Ambulatory Visit: Payer: Medicare Other | Admitting: Nurse Practitioner

## 2023-06-05 ENCOUNTER — Encounter: Payer: Medicare Other | Admitting: Internal Medicine

## 2023-07-04 ENCOUNTER — Other Ambulatory Visit: Payer: Self-pay | Admitting: Internal Medicine

## 2023-07-22 ENCOUNTER — Encounter: Payer: Medicare Other | Admitting: Internal Medicine

## 2023-08-18 ENCOUNTER — Encounter: Payer: Medicare Other | Admitting: Internal Medicine

## 2023-08-28 ENCOUNTER — Telehealth: Payer: Self-pay

## 2023-08-28 NOTE — Telephone Encounter (Signed)
I spoke to the patient's daughter today to inform her that we had received the patient's FL2 paperwork. Dr. Oneta Rack had advised after reviewing the patient's chart, knowing the patient had already cancelled her previous appointments and also not followed up with [facility] that she would need an appointment with her PCP of record for paperwork completion.  The patient's daughter dropped the paperwork off at this office on Monday, December 23rd after 3:00 pm and the front desk following protocol took the paperwork and sent it back for determination or completion. The patient's daughter was vocally upset with two staff members and at end of conversation requested that the paperwork be faxed or mailed back to her today. Staff reached out appropriately to daughter and left a voicemail.
